# Patient Record
Sex: Female | Born: 1990 | Race: White | Hispanic: No | Marital: Married | Smoking: Never smoker
Health system: Southern US, Community
[De-identification: ages and names within clinical notes are randomized; demographics above are authoritative.]

## PROBLEM LIST (undated history)

## (undated) DIAGNOSIS — E663 Overweight: Secondary | ICD-10-CM

## (undated) DIAGNOSIS — M7660 Achilles tendinitis, unspecified leg: Secondary | ICD-10-CM

## (undated) DIAGNOSIS — K589 Irritable bowel syndrome without diarrhea: Secondary | ICD-10-CM

## (undated) DIAGNOSIS — R002 Palpitations: Secondary | ICD-10-CM

## (undated) DIAGNOSIS — K219 Gastro-esophageal reflux disease without esophagitis: Secondary | ICD-10-CM

## (undated) DIAGNOSIS — F32A Depression, unspecified: Secondary | ICD-10-CM

## (undated) DIAGNOSIS — F419 Anxiety disorder, unspecified: Secondary | ICD-10-CM

## (undated) DIAGNOSIS — M549 Dorsalgia, unspecified: Secondary | ICD-10-CM

## (undated) DIAGNOSIS — L709 Acne, unspecified: Secondary | ICD-10-CM

## (undated) HISTORY — DX: Acne, unspecified: L70.9

## (undated) HISTORY — DX: Overweight: E66.3

## (undated) HISTORY — DX: Dorsalgia, unspecified: M54.9

## (undated) HISTORY — DX: Palpitations: R00.2

## (undated) HISTORY — DX: Irritable bowel syndrome, unspecified: K58.9

## (undated) HISTORY — PX: NO PAST SURGERIES: SHX2092

## (undated) HISTORY — DX: Gastro-esophageal reflux disease without esophagitis: K21.9

## (undated) HISTORY — DX: Anxiety disorder, unspecified: F41.9

## (undated) HISTORY — DX: Achilles tendinitis, unspecified leg: M76.60

## (undated) HISTORY — DX: Depression, unspecified: F32.A

---

## 2016-08-19 DIAGNOSIS — R300 Dysuria: Secondary | ICD-10-CM | POA: Diagnosis not present

## 2016-08-19 DIAGNOSIS — Z30011 Encounter for initial prescription of contraceptive pills: Secondary | ICD-10-CM | POA: Diagnosis not present

## 2016-08-19 DIAGNOSIS — B373 Candidiasis of vulva and vagina: Secondary | ICD-10-CM | POA: Diagnosis not present

## 2016-08-19 DIAGNOSIS — Z30432 Encounter for removal of intrauterine contraceptive device: Secondary | ICD-10-CM | POA: Diagnosis not present

## 2016-09-26 DIAGNOSIS — Z01419 Encounter for gynecological examination (general) (routine) without abnormal findings: Secondary | ICD-10-CM | POA: Diagnosis not present

## 2016-09-26 DIAGNOSIS — Z124 Encounter for screening for malignant neoplasm of cervix: Secondary | ICD-10-CM | POA: Diagnosis not present

## 2016-09-26 DIAGNOSIS — Z3041 Encounter for surveillance of contraceptive pills: Secondary | ICD-10-CM | POA: Diagnosis not present

## 2016-09-26 LAB — LIPID PANEL
Cholesterol: 181 (ref 0–200)
HDL: 45 (ref 35–70)
LDL Cholesterol: 24
LDl/HDL Ratio: 2.5
Triglycerides: 119 (ref 40–160)

## 2017-01-10 ENCOUNTER — Other Ambulatory Visit: Payer: 59

## 2017-01-10 ENCOUNTER — Ambulatory Visit (INDEPENDENT_AMBULATORY_CARE_PROVIDER_SITE_OTHER): Payer: Managed Care, Other (non HMO) | Admitting: Family

## 2017-01-10 ENCOUNTER — Encounter: Payer: Self-pay | Admitting: Family

## 2017-01-10 VITALS — BP 118/76 | HR 74 | Temp 98.1°F | Wt 192.0 lb

## 2017-01-10 DIAGNOSIS — R350 Frequency of micturition: Secondary | ICD-10-CM | POA: Diagnosis not present

## 2017-01-10 DIAGNOSIS — R319 Hematuria, unspecified: Secondary | ICD-10-CM | POA: Diagnosis not present

## 2017-01-10 LAB — POC URINALSYSI DIPSTICK (AUTOMATED)
Bilirubin, UA: NEGATIVE
Glucose, UA: NEGATIVE
Ketones, UA: NEGATIVE
Leukocytes, UA: NEGATIVE
Nitrite, UA: NEGATIVE
Protein, UA: NEGATIVE
Spec Grav, UA: >=1.03 — AB (ref 1.010–1.025)
Urobilinogen, UA: 0.2 E.U./dL
pH, UA: 5.5 (ref 5.0–8.0)

## 2017-01-10 MED ORDER — SULFAMETHOXAZOLE-TRIMETHOPRIM 800-160 MG PO TABS
1.0000 | ORAL_TABLET | Freq: Two times a day (BID) | ORAL | 0 refills | Status: DC
Start: 2017-01-10 — End: 2017-02-05

## 2017-01-10 NOTE — Addendum Note (Signed)
Addended by: Tawnya CrookSAMBATH, Ivorie Uplinger on: 01/10/2017 04:16 PM   Modules accepted: Orders

## 2017-01-10 NOTE — Addendum Note (Signed)
Addended by: Tawnya CrookSAMBATH, Tobyn Osgood on: 01/10/2017 04:32 PM   Modules accepted: Orders

## 2017-01-10 NOTE — Progress Notes (Signed)
  Diana King is a 26 y.o. female with the following history as recorded in [employer]:  There are no active problems to display for this patient.   Current Outpatient Medications  Medication Sig Dispense Refill  . norethindrone-ethinyl estradiol (JUNEL FE,GILDESS FE,LOESTRIN FE) 1-20 MG-MCG tablet Take by mouth.    . sulfamethoxazole-trimethoprim (BACTRIM DS,SEPTRA DS) 800-160 MG tablet Take 1 tablet by mouth 2 (two) times daily. 10 tablet 0   No current facility-administered medications for this visit.     Allergies: Latex  No past medical history on file.  No family history on file.   Social History   Tobacco Use  . Smoking status: Never Smoker  . Smokeless tobacco: Never Used  Substance Use Topics  . 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   Objective:  Vitals:   01/10/17 0822  BP: 118/76  Pulse: 74  Temp: 98.1 F (36.7 C)  TempSrc: Oral  SpO2: 97%    General: Well developed, well nourished, in no acute distress  Skin : Warm and dry.  Head: Normocephalic and atraumatic  Lungs: Respirations unlabored; clear to auscultation bilaterally without wheeze, rales, rhonchi  CVS exam: normal rate and regular rhythm.  Neurologic: Alert and oriented; speech intact; face symmetrical; moves all extremities well; CNII-XII intact  without focal deficit   Assessment:  1. Hematuria, unspecified type   2. Urinary frequency     Plan:  1. ? Infection or possible kidney stone; check U/A and urine culture; will start patient on Bactrim DS bid x 5 days- back-up method discussed for OCP; plan to re-check U/A on Monday- if blood still present, would recommend CT for kidney stone; strict ER precautions for upcoming weekend.   No Follow-up on file.  Orders Placed This Encounter  Procedures  . CULTURE, URINE COMPREHENSIVE    Standing Status:   Future    Standing Expiration Date:   01/10/2018  . CT RENAL STONE STUDY    Standing Status:   Future    Standing Expiration Date:   04/11/2018    Scheduling Instructions:     Please try to schedule on Monday    Order Specific Question:   Is patient pregnant?    Answer:   No    Order Specific Question:   Preferred imaging location?    Answer:   [facility]    Order Specific Question:   Radiology Contrast Protocol - do NOT remove file path    Answer:   [email or web address]  . POCT Urinalysis Dipstick (Automated)    Requested Prescriptions   Signed Prescriptions Disp Refills  . sulfamethoxazole-trimethoprim (BACTRIM DS,SEPTRA DS) 800-160 MG tablet 10 tablet 0    Sig: Take 1 tablet by mouth 2 (two) times daily.

## 2017-01-12 LAB — CULTURE, URINE COMPREHENSIVE
MICRO NUMBER:: 81439485
SPECIMEN QUALITY:: ADEQUATE

## 2017-01-13 NOTE — Progress Notes (Signed)
Reviewed culture with patient; appears c/w contamination; repeated U/A and amount of blood has decreased compared to Friday; suspect that may have passed small stone; since patient not in pain and hematuria improving, will check U/A again in 2 days to ensure resolved;

## 2017-01-15 ENCOUNTER — Inpatient Hospital Stay: Admission: RE | Admit: 2017-01-15 | Payer: 59 | Source: Ambulatory Visit

## 2017-02-05 ENCOUNTER — Other Ambulatory Visit (INDEPENDENT_AMBULATORY_CARE_PROVIDER_SITE_OTHER): Payer: Managed Care, Other (non HMO)

## 2017-02-05 ENCOUNTER — Telehealth: Payer: Self-pay | Admitting: Family

## 2017-02-05 ENCOUNTER — Other Ambulatory Visit: Payer: Self-pay | Admitting: Family

## 2017-02-05 DIAGNOSIS — R3121 Asymptomatic microscopic hematuria: Secondary | ICD-10-CM

## 2017-02-05 LAB — URINALYSIS, ROUTINE W REFLEX MICROSCOPIC
Bilirubin Urine: NEGATIVE
Ketones, ur: 15 — AB
Leukocytes, UA: NEGATIVE
Nitrite: NEGATIVE
Specific Gravity, Urine: 1.015 (ref 1.000–1.030)
Total Protein, Urine: NEGATIVE
Urine Glucose: NEGATIVE
Urobilinogen, UA: 0.2 (ref 0.0–1.0)
pH: 8 (ref 5.0–8.0)

## 2017-02-05 NOTE — Telephone Encounter (Signed)
Patient seen in office as professional courtesy on recent episode of unexplained microscopic hematuria; she is not currently on her period- ended 4 days ago; will repeat U/A with microscopic exam; if symptoms persist, will refer to urology; patient in agreement with plan.

## 2017-02-07 ENCOUNTER — Other Ambulatory Visit: Payer: Self-pay | Admitting: Family

## 2017-02-07 DIAGNOSIS — R3121 Asymptomatic microscopic hematuria: Secondary | ICD-10-CM

## 2017-02-07 NOTE — Progress Notes (Signed)
Reviewed with patient; blood is present; will refer to urology; she is in agreement.

## 2017-03-10 ENCOUNTER — Encounter: Payer: Self-pay | Admitting: Internal Medicine

## 2017-03-10 ENCOUNTER — Ambulatory Visit: Payer: Managed Care, Other (non HMO) | Admitting: Internal Medicine

## 2017-03-10 VITALS — BP 120/84 | HR 74 | Temp 98.1°F | Resp 16

## 2017-03-10 DIAGNOSIS — J069 Acute upper respiratory infection, unspecified: Secondary | ICD-10-CM

## 2017-03-10 NOTE — Progress Notes (Signed)
    Subjective:    Patient ID: Diana King, female    DOB: 1990-12-25, 27 y.o.   MRN: [id number]  HPI She is here for an acute visit for cold symptoms.  Her symptoms started two days ago.  She is experiencing subjective fever, nasal congestion, rhinorrhea, sore throat, shortness of breath, myalgias and lightheadedness at times.  She denies any known fever, chills, sinus pain, cough, wheeze, GI symptoms and headaches.  She has taken dayquil, nyquil, Cepacol,Nasal spray, advil  Medications and allergies reviewed with patient and updated if appropriate.  There are no active problems to display for this patient.   Current Outpatient Medications on File Prior to Visit  Medication Sig Dispense Refill  . drospirenone-ethinyl estradiol (YAZ,GIANVI,LORYNA) 3-0.02 MG tablet Take 1 tablet by mouth daily.     No current facility-administered medications on file prior to visit.     History reviewed. No pertinent past medical history.  History reviewed. No pertinent surgical history.  Social History   Socioeconomic History  . Marital status: Married    Spouse name: None  . Number of children: None  . Years of education: None  . Highest education level: None  Social Needs  . Financial resource strain: None  . Food insecurity - worry: None  . Food insecurity - inability: None  . Transportation needs - medical: None  . Transportation needs - non-medical: None  Occupational History  . None  Tobacco Use  . Smoking status: Never Smoker  . Smokeless tobacco: Never Used  Substance and Sexual Activity  . Alcohol use: Yes    Frequency: Never    Comment: social  . Drug use: None  . Sexual activity: None  Other Topics Concern  . None  Social History Narrative  . None    Family History  Problem Relation Age of Onset  . Fibromyalgia Mother   . Interstitial cystitis Mother   . Migraines Mother   . Alcohol abuse Maternal Aunt   . Mental illness Maternal Aunt   . Alcohol abuse  Maternal Uncle   . Thyroid disease Maternal Grandmother     Review of Systems  Constitutional: Positive for fever (subjective ). Negative for chills.  HENT: Positive for congestion, rhinorrhea and sore throat. Negative for ear pain (clogged sensation), sinus pressure and sinus pain.   Respiratory: Positive for shortness of breath. Negative for cough and wheezing.   Gastrointestinal: Negative for diarrhea and nausea.  Musculoskeletal: Positive for myalgias.  Neurological: Positive for light-headedness. Negative for dizziness and headaches.       Objective:   Vitals:   03/10/17 1148  BP: 120/84  Pulse: 74  Resp: 16  Temp: 98.1 F (36.7 C)  SpO2: 98%   There were no vitals filed for this visit. There is no height or weight on file to calculate BMI.  Wt Readings from Last 3 Encounters:  01/10/17 192 lb (87.1 kg)     Physical Exam GENERAL APPEARANCE: Appears stated age, well appearing, NAD EYES: conjunctiva clear, no icterus HEENT: bilateral tympanic membranes and ear canals normal, oropharynx with mild erythema, left small tonsillar stone, no thyromegaly, trachea midline, no cervical or supraclavicular lymphadenopathy LUNGS: Clear to auscultation without wheeze or crackles, unlabored breathing, good air entry bilaterally CARDIOVASCULAR: Normal S1,S2 without murmurs, no edema SKIN: warm, dry        Assessment & Plan:   See Problem List for Assessment and Plan of chronic medical problems.

## 2017-03-10 NOTE — Assessment & Plan Note (Signed)
Likely viral in nature Symptomatic treatment Continue current over-the-counter cold medications She will me know if there is no improvement or if symptoms worsen we can consider an antibiotic Rest, fluids

## 2017-03-10 NOTE — Patient Instructions (Signed)
  If your symptoms worsen or fail to improve, please contact our office for further instruction, or in case of emergency go directly to the emergency room at the closest medical facility.   General Recommendations:    Please drink plenty of fluids.  Get plenty of rest   Sleep in humidified air  Use saline nasal sprays  Netti pot  OTC Medications:  Decongestants - helps relieve congestion   Flonase (generic fluticasone) or Nasacort (generic triamcinolone) - please make sure to use the "cross-over" technique at a 45 degree angle towards the opposite eye as opposed to straight up the nasal passageway.   Sudafed (generic pseudoephedrine - Note this is the one that is available behind the pharmacy counter); Products with phenylephrine (-PE) may also be used but is often not as effective as pseudoephedrine.   If you have HIGH BLOOD PRESSURE - Coricidin HBP; AVOID any product that is -D as this contains pseudoephedrine which may increase your blood pressure.  Afrin (oxymetazoline) every 6-8 hours for up to 3 days.  Allergies - helps relieve runny nose, itchy eyes and sneezing   Claritin (generic loratidine), Allegra (fexofenidine), or Zyrtec (generic cyrterizine) for runny nose. These medications should not cause drowsiness.  Note - Benadryl (generic diphenhydramine) may be used however may cause drowsiness  Cough -   Delsym or Robitussin (generic dextromethorphan)  Expectorants - helps loosen mucus to ease removal   Mucinex (generic guaifenesin) as directed on the package.  Headaches / General Aches   Tylenol (generic acetaminophen) - DO NOT EXCEED 3 grams (3,000 mg) in a 24 hour time period  Advil/Motrin (generic ibuprofen)  Sore Throat -   Salt water gargle   Chloraseptic (generic benzocaine) spray or lozenges / Sucrets (generic dyclonine)  

## 2017-06-10 NOTE — Progress Notes (Signed)
Subjective:    Patient ID: Diana King, female    DOB: 1990/03/10, 27 y.o.   MRN: [id number]  HPI  She is here for a physical exam.    She denies changes in her family or personal history.    She is following with her gyn and was recently put on different birth control.  This has helped to control her menses and she feels more emotionally stable.   She still feels very irritable and feels it is hormonal.  She talked to her gyn and they discussed possible starting paxil.  She wonders if she needs it.  She denies depression or anxiety - just feels irritable and feels she is taking it out on her husband and daughter.   She is concerned about her weight.  She is not currently exercising.  She is trying to eat better and eat less.  She knows she needs to work on weight loss.     Medications and allergies reviewed with patient and updated if appropriate.  Patient Active Problem List   Diagnosis Date Noted  . Irritability 06/11/2017  . Low back pain 06/11/2017    Current Outpatient Medications on File Prior to Visit  Medication Sig Dispense Refill  . drospirenone-ethinyl estradiol (YAZ,GIANVI,LORYNA) 3-0.02 MG tablet Take 1 tablet by mouth daily.     No current facility-administered medications on file prior to visit.     History reviewed. No pertinent past medical history.  Past Surgical History:  Procedure Laterality Date  . NO PAST SURGERIES      Social History   Socioeconomic History  . Marital status: Married    Spouse name: Not on file  . Number of children: Not on file  . Years of education: Not on file  . Highest education level: Not on file  Occupational History  . Not on file  Social Needs  . Financial resource strain: Not on file  . Food insecurity:    Worry: Not on file    Inability: Not on file  . Transportation needs:    Medical: Not on file    Non-medical: Not on file  Tobacco Use  . Smoking status: Never Smoker  . Smokeless tobacco: Never Used    Substance and Sexual Activity  . Alcohol use: Yes    Frequency: Never    Comment: social  . Drug use: Not on file  . Sexual activity: Not on file  Lifestyle  . Physical activity:    Days per week: Not on file    Minutes per session: Not on file  . Stress: Not on file  Relationships  . Social connections:    Talks on phone: Not on file    Gets together: Not on file    Attends religious service: Not on file    Active member of club or organization: Not on file    Attends meetings of clubs or organizations: Not on file    Relationship status: Not on file  Other Topics Concern  . Not on file  Social History Narrative  . Not on file    Family History  Problem Relation Age of Onset  . Fibromyalgia Mother   . Interstitial cystitis Mother   . Migraines Mother   . Alcohol abuse Maternal Aunt   . Mental illness Maternal Aunt   . Alcohol abuse Maternal Uncle   . Thyroid disease Maternal Grandmother     Review of Systems  Constitutional: Positive for fatigue (low energy level). Negative  for chills and fever.  Eyes: Negative for visual disturbance.  Respiratory: Negative for cough, shortness of breath and wheezing.   Cardiovascular: Negative for chest pain, palpitations and leg swelling.  Gastrointestinal: Positive for abdominal distention (with certain foods), diarrhea (more often - depends on food) and nausea (with certain foods). Negative for blood in stool and constipation.  Genitourinary: Negative for dysuria and hematuria.  Musculoskeletal: Positive for back pain (intermittent - lower back).  Skin: Negative for color change and rash.  Neurological: Negative for dizziness, light-headedness and headaches.  Psychiatric/Behavioral: Positive for agitation (irritability). Negative for dysphoric mood. The patient is not nervous/anxious.        Irritable at times       Objective:   Vitals:   06/11/17 1545  BP: 104/60  Pulse: 80  Resp: 16  Temp: 98.1 F (36.7 C)  SpO2:  98%   BP Readings from Last 3 Encounters:  06/11/17 104/60  03/10/17 120/84  01/10/17 118/76   Wt Readings from Last 3 Encounters:  06/11/17 192 lb (87.1 kg)  01/10/17 192 lb (87.1 kg)   Body mass index is 31.95 kg/m.   Physical Exam    Constitutional: She appears well-developed and well-nourished. No distress.  HENT:  Head: Normocephalic and atraumatic.  Right Ear: External ear normal. Normal ear canal and TM Left Ear: External ear normal.  Normal ear canal and TM Mouth/Throat: Oropharynx is clear and moist.  Eyes: Conjunctivae and EOM are normal.  Neck: Neck supple. No tracheal deviation present. No thyromegaly present.  No carotid bruit  Cardiovascular: Normal rate, regular rhythm and normal heart sounds.   No murmur heard.  No edema. Pulmonary/Chest: Effort normal and breath sounds normal. No respiratory distress. She has no wheezes. She has no rales.  Breast: deferred to Gyn Abdominal: Soft. She exhibits no distension. There is no tenderness.  Lymphadenopathy: She has no cervical adenopathy.  Skin: Skin is warm and dry. She is not diaphoretic.  Psychiatric: She has a normal mood and affect. Her behavior is normal.       Assessment & Plan:   Physical exam: Screening blood work  ordered Immunizations   Up to date  Gyn   Up to date  Eye exams   Up to date  Exercise   Not currently - discussed importance regular exercise Weight  Working on weight loss - discussed healthy diet and decreased portions Skin   No concernes Substance abuse  none     See Problem List for Assessment and Plan of chronic medical problems.

## 2017-06-11 ENCOUNTER — Encounter: Payer: Self-pay | Admitting: Internal Medicine

## 2017-06-11 ENCOUNTER — Ambulatory Visit (INDEPENDENT_AMBULATORY_CARE_PROVIDER_SITE_OTHER): Payer: 59 | Admitting: Internal Medicine

## 2017-06-11 VITALS — BP 104/60 | HR 80 | Temp 98.1°F | Resp 16 | Ht 65.0 in | Wt 192.0 lb

## 2017-06-11 DIAGNOSIS — Z114 Encounter for screening for human immunodeficiency virus [HIV]: Secondary | ICD-10-CM

## 2017-06-11 DIAGNOSIS — Z6832 Body mass index (BMI) 32.0-32.9, adult: Secondary | ICD-10-CM | POA: Insufficient documentation

## 2017-06-11 DIAGNOSIS — M545 Low back pain, unspecified: Secondary | ICD-10-CM | POA: Insufficient documentation

## 2017-06-11 DIAGNOSIS — E669 Obesity, unspecified: Secondary | ICD-10-CM | POA: Insufficient documentation

## 2017-06-11 DIAGNOSIS — Z0000 Encounter for general adult medical examination without abnormal findings: Secondary | ICD-10-CM

## 2017-06-11 DIAGNOSIS — R454 Irritability and anger: Secondary | ICD-10-CM

## 2017-06-11 DIAGNOSIS — F32A Depression, unspecified: Secondary | ICD-10-CM | POA: Insufficient documentation

## 2017-06-11 DIAGNOSIS — F329 Major depressive disorder, single episode, unspecified: Secondary | ICD-10-CM | POA: Insufficient documentation

## 2017-06-11 DIAGNOSIS — F419 Anxiety disorder, unspecified: Secondary | ICD-10-CM | POA: Insufficient documentation

## 2017-06-11 MED ORDER — BUPROPION HCL ER (XL) 150 MG PO TB24: 150.0000 mg | ORAL_TABLET | Freq: Every day | ORAL | 3 refills | Status: DC

## 2017-06-11 NOTE — Assessment & Plan Note (Signed)
hormonal No anxiety or depression Will try wellbutrin 150 mg XL daily  -- this may or may not help, but may help with increasing energy and weight loss If not effective will consider adding lexapro or changing to low dose paxil

## 2017-06-11 NOTE — Patient Instructions (Addendum)
Test(s) ordered today. Your results will be released to [facility] (or called to you) after review, usually within 72hours after test completion. If any changes need to be made, you will be notified at that same time.  All other Health Maintenance issues reviewed.   All recommended immunizations and age-appropriate screenings are up-to-date or discussed.  No immunizations administered today.   Medications reviewed and updated.  Changes include starting wellbutrin 150 mg daily.   Your prescription(s) have been submitted to your pharmacy. Please take as directed and contact our office if you believe you are having problem(s) with the medication(s).   Please followup in one year   Health Maintenance, Female Adopting a healthy lifestyle and getting preventive care can go a long way to promote health and wellness. Talk with your health care provider about what schedule of regular examinations is right for you. This is a good chance for you to check in with your provider about disease prevention and staying healthy. In between checkups, there are plenty of things you can do on your own. Experts have done a lot of research about which lifestyle changes and preventive measures are most likely to keep you healthy. Ask your health care provider for more information. Weight and diet Eat a healthy diet  Be sure to include plenty of vegetables, fruits, low-fat dairy products, and lean protein.  Do not eat a lot of foods high in solid fats, added sugars, or salt.  Get regular exercise. This is one of the most important things you can do for your health. ? Most adults should exercise for at least 150 minutes each week. The exercise should increase your heart rate and make you sweat (moderate-intensity exercise). ? Most adults should also do strengthening exercises at least twice a week. This is in addition to the moderate-intensity exercise.  Maintain a healthy weight  Body mass index (BMI) is a  measurement that can be used to identify possible weight problems. It estimates body fat based on height and weight. Your health care provider can help determine your BMI and help you achieve or maintain a healthy weight.  For females 35 years of age and older: ? A BMI below 18.5 is considered underweight. ? A BMI of 18.5 to 24.9 is normal. ? A BMI of 25 to 29.9 is considered overweight. ? A BMI of 30 and above is considered obese.  Watch levels of cholesterol and blood lipids  You should start having your blood tested for lipids and cholesterol at 29 years of age, then have this test every 5 years.  You may need to have your cholesterol levels checked more often if: ? Your lipid or cholesterol levels are high. ? You are older than 29 years of age. ? You are at high risk for heart disease.  Cancer screening Lung Cancer  Lung cancer screening is recommended for adults 63-70 years old who are at high risk for lung cancer because of a history of smoking.  A yearly low-dose CT scan of the lungs is recommended for people who: ? Currently smoke. ? Have quit within the past 15 years. ? Have at least a 30-pack-year history of smoking. A pack year is smoking an average of one pack of cigarettes a day for 1 year.  Yearly screening should continue until it has been 15 years since you quit.  Yearly screening should stop if you develop a health problem that would prevent you from having lung cancer treatment.  Breast Cancer  Practice  breast self-awareness. This means understanding how your breasts normally appear and feel.  It also means doing regular breast self-exams. Let your health care provider know about any changes, no matter how small.  If you are in your 20s or 30s, you should have a clinical breast exam (CBE) by a health care provider every 1-3 years as part of a regular health exam.  If you are 49 or older, have a CBE every year. Also consider having a breast X-ray (mammogram)  every year.  If you have a family history of breast cancer, talk to your health care provider about genetic screening.  If you are at high risk for breast cancer, talk to your health care provider about having an MRI and a mammogram every year.  Breast cancer gene (BRCA) assessment is recommended for women who have family members with BRCA-related cancers. BRCA-related cancers include: ? Breast. ? Ovarian. ? Tubal. ? Peritoneal cancers.  Results of the assessment will determine the need for genetic counseling and BRCA1 and BRCA2 testing.  Cervical Cancer Your health care provider may recommend that you be screened regularly for cancer of the pelvic organs (ovaries, uterus, and vagina). This screening involves a pelvic examination, including checking for microscopic changes to the surface of your cervix (Pap test). You may be encouraged to have this screening done every 3 years, beginning at age 67.  For women ages 52-65, health care providers may recommend pelvic exams and Pap testing every 3 years, or they may recommend the Pap and pelvic exam, combined with testing for human papilloma virus (HPV), every 5 years. Some types of HPV increase your risk of cervical cancer. Testing for HPV may also be done on women of any age with unclear Pap test results.  Other health care providers may not recommend any screening for nonpregnant women who are considered low risk for pelvic cancer and who do not have symptoms. Ask your health care provider if a screening pelvic exam is right for you.  If you have had past treatment for cervical cancer or a condition that could lead to cancer, you need Pap tests and screening for cancer for at least 20 years after your treatment. If Pap tests have been discontinued, your risk factors (such as having a new sexual partner) need to be reassessed to determine if screening should resume. Some women have medical problems that increase the chance of getting cervical  cancer. In these cases, your health care provider may recommend more frequent screening and Pap tests.  Colorectal Cancer  This type of cancer can be detected and often prevented.  Routine colorectal cancer screening usually begins at 29 years of age and continues through 29 years of age.  Your health care provider may recommend screening at an earlier age if you have risk factors for colon cancer.  Your health care provider may also recommend using home test kits to check for hidden blood in the stool.  A small camera at the end of a tube can be used to examine your colon directly (sigmoidoscopy or colonoscopy). This is done to check for the earliest forms of colorectal cancer.  Routine screening usually begins at age 40.  Direct examination of the colon should be repeated every 5-10 years through 29 years of age. However, you may need to be screened more often if early forms of precancerous polyps or small growths are found.  Skin Cancer  Check your skin from head to toe regularly.  Tell your health care provider  about any new moles or changes in moles, especially if there is a change in a mole's shape or color.  Also tell your health care provider if you have a mole that is larger than the size of a pencil eraser.  Always use sunscreen. Apply sunscreen liberally and repeatedly throughout the day.  Protect yourself by wearing long sleeves, pants, a wide-brimmed hat, and sunglasses whenever you are outside.  Heart disease, diabetes, and high blood pressure  High blood pressure causes heart disease and increases the risk of stroke. High blood pressure is more likely to develop in: ? People who have blood pressure in the high end of the normal range (130-139/85-89 mm Hg). ? People who are overweight or obese. ? People who are African American.  If you are 83-3 years of age, have your blood pressure checked every 3-5 years. If you are 79 years of age or older, have your blood  pressure checked every year. You should have your blood pressure measured twice-once when you are at a hospital or clinic, and once when you are not at a hospital or clinic. Record the average of the two measurements. To check your blood pressure when you are not at a hospital or clinic, you can use: ? An automated blood pressure machine at a pharmacy. ? A home blood pressure monitor.  If you are between 95 years and 47 years old, ask your health care provider if you should take aspirin to prevent strokes.  Have regular diabetes screenings. This involves taking a blood sample to check your fasting blood sugar level. ? If you are at a normal weight and have a low risk for diabetes, have this test once every three years after 29 years of age. ? If you are overweight and have a high risk for diabetes, consider being tested at a younger age or more often. Preventing infection Hepatitis B  If you have a higher risk for hepatitis B, you should be screened for this virus. You are considered at high risk for hepatitis B if: ? You were born in a country where hepatitis B is common. Ask your health care provider which countries are considered high risk. ? Your parents were born in a high-risk country, and you have not been immunized against hepatitis B (hepatitis B vaccine). ? You have HIV or AIDS. ? You use needles to inject street drugs. ? You live with someone who has hepatitis B. ? You have had sex with someone who has hepatitis B. ? You get hemodialysis treatment. ? You take certain medicines for conditions, including cancer, organ transplantation, and autoimmune conditions.  Hepatitis C  Blood testing is recommended for: ? Everyone born from [birth year] through 1967. ? Anyone with known risk factors for hepatitis C.  Sexually transmitted infections (STIs)  You should be screened for sexually transmitted infections (STIs) including gonorrhea and chlamydia if: ? You are sexually active and are  younger than 29 years of age. ? You are older than 29 years of age and your health care provider tells you that you are at risk for this type of infection. ? Your sexual activity has changed since you were last screened and you are at an increased risk for chlamydia or gonorrhea. Ask your health care provider if you are at risk.  If you do not have HIV, but are at risk, it may be recommended that you take a prescription medicine daily to prevent HIV infection. This is called pre-exposure prophylaxis (PrEP). You are  considered at risk if: ? You are sexually active and do not regularly use condoms or know the HIV status of your partner(s). ? You take drugs by injection. ? You are sexually active with a partner who has HIV.  Talk with your health care provider about whether you are at high risk of being infected with HIV. If you choose to begin PrEP, you should first be tested for HIV. You should then be tested every 3 months for as long as you are taking PrEP. Pregnancy  If you are premenopausal and you may become pregnant, ask your health care provider about preconception counseling.  If you may become pregnant, take 400 to 800 micrograms (mcg) of folic acid every day.  If you want to prevent pregnancy, talk to your health care provider about birth control (contraception). Osteoporosis and menopause  Osteoporosis is a disease in which the bones lose minerals and strength with aging. This can result in serious bone fractures. Your risk for osteoporosis can be identified using a bone density scan.  If you are 39 years of age or older, or if you are at risk for osteoporosis and fractures, ask your health care provider if you should be screened.  Ask your health care provider whether you should take a calcium or vitamin D supplement to lower your risk for osteoporosis.  Menopause may have certain physical symptoms and risks.  Hormone replacement therapy may reduce some of these symptoms and  risks. Talk to your health care provider about whether hormone replacement therapy is right for you. Follow these instructions at home:  Schedule regular health, dental, and eye exams.  Stay current with your immunizations.  Do not use any tobacco products including cigarettes, chewing tobacco, or electronic cigarettes.  If you are pregnant, do not drink alcohol.  If you are breastfeeding, limit how much and how often you drink alcohol.  Limit alcohol intake to no more than 1 drink per day for nonpregnant women. One drink equals 12 ounces of beer, 5 ounces of wine, or 1 ounces of hard liquor.  Do not use street drugs.  Do not share needles.  Ask your health care provider for help if you need support or information about quitting drugs.  Tell your health care provider if you often feel depressed.  Tell your health care provider if you have ever been abused or do not feel safe at home. This information is not intended to replace advice given to you by your health care provider. Make sure you discuss any questions you have with your health care provider. Document Released: 07/23/2010 Document Revised: 06/15/2015 Document Reviewed: 10/11/2014 Elsevier Interactive Patient Education  [employer].

## 2017-06-11 NOTE — Assessment & Plan Note (Signed)
Intermittent, low back Will try to work on core strength

## 2017-06-11 NOTE — Assessment & Plan Note (Signed)
Wants to lose weight Will try to start regular exercise Discussed healthy diet and decreased portions Stressed working on lifestyle changes.

## 2017-06-12 ENCOUNTER — Encounter: Payer: Self-pay | Admitting: Internal Medicine

## 2017-06-13 ENCOUNTER — Other Ambulatory Visit (INDEPENDENT_AMBULATORY_CARE_PROVIDER_SITE_OTHER): Payer: 59

## 2017-06-13 DIAGNOSIS — Z0000 Encounter for general adult medical examination without abnormal findings: Secondary | ICD-10-CM | POA: Diagnosis not present

## 2017-06-13 DIAGNOSIS — Z114 Encounter for screening for human immunodeficiency virus [HIV]: Secondary | ICD-10-CM

## 2017-06-13 LAB — COMPREHENSIVE METABOLIC PANEL
ALT: 11 U/L (ref 0–35)
AST: 13 U/L (ref 0–37)
Albumin: 4.2 g/dL (ref 3.5–5.2)
Alkaline Phosphatase: 31 U/L — ABNORMAL LOW (ref 39–117)
BUN: 14 mg/dL (ref 6–23)
CO2: 25 mEq/L (ref 19–32)
Calcium: 9.3 mg/dL (ref 8.4–10.5)
Chloride: 104 mEq/L (ref 96–112)
Creatinine, Ser: 0.85 mg/dL (ref 0.40–1.20)
GFR: 83.89 mL/min (ref >60.00)
Glucose, Bld: 102 mg/dL — ABNORMAL HIGH (ref 70–99)
Potassium: 4.2 mEq/L (ref 3.5–5.1)
Sodium: 139 mEq/L (ref 135–145)
Total Bilirubin: 0.3 mg/dL (ref 0.2–1.2)
Total Protein: 7.5 g/dL (ref 6.0–8.3)

## 2017-06-13 LAB — CBC WITH DIFFERENTIAL/PLATELET
Basophils Absolute: 0.1 10*3/uL (ref 0.0–0.1)
Basophils Relative: 0.8 % (ref 0.0–3.0)
Eosinophils Absolute: 0.1 10*3/uL (ref 0.0–0.7)
Eosinophils Relative: 0.8 % (ref 0.0–5.0)
HCT: 38.9 % (ref 36.0–46.0)
Hemoglobin: 12.9 g/dL (ref 12.0–15.0)
Lymphocytes Relative: 33.7 % (ref 12.0–46.0)
Lymphs Abs: 2.6 10*3/uL (ref 0.7–4.0)
MCHC: 33 g/dL (ref 30.0–36.0)
MCV: 85.1 fl (ref 78.0–100.0)
Monocytes Absolute: 0.7 10*3/uL (ref 0.1–1.0)
Monocytes Relative: 8.7 % (ref 3.0–12.0)
Neutro Abs: 4.2 10*3/uL (ref 1.4–7.7)
Neutrophils Relative %: 56 % (ref 43.0–77.0)
Platelets: 362 10*3/uL (ref 150.0–400.0)
RBC: 4.58 Mil/uL (ref 3.87–5.11)
RDW: 12.8 % (ref 11.5–15.5)
WBC: 7.6 10*3/uL (ref 4.0–10.5)

## 2017-06-13 LAB — LIPID PANEL
Cholesterol: 175 mg/dL (ref 0–200)
HDL: 49.2 mg/dL (ref >39.00)
LDL Cholesterol: 93 mg/dL (ref 0–99)
NonHDL: 125.62
Total CHOL/HDL Ratio: 4
Triglycerides: 164 mg/dL — ABNORMAL HIGH (ref 0.0–149.0)
VLDL: 32.8 mg/dL (ref 0.0–40.0)

## 2017-06-13 LAB — TSH: TSH: 3.17 u[IU]/mL (ref 0.35–4.50)

## 2017-06-14 LAB — HIV ANTIBODY (ROUTINE TESTING W REFLEX): HIV 1&2 Ab, 4th Generation: NONREACTIVE

## 2017-06-15 ENCOUNTER — Encounter: Payer: Self-pay | Admitting: Internal Medicine

## 2017-06-18 ENCOUNTER — Ambulatory Visit (INDEPENDENT_AMBULATORY_CARE_PROVIDER_SITE_OTHER): Payer: 59 | Admitting: Family Medicine

## 2017-06-18 DIAGNOSIS — M999 Biomechanical lesion, unspecified: Secondary | ICD-10-CM | POA: Diagnosis not present

## 2017-06-18 DIAGNOSIS — M545 Low back pain, unspecified: Secondary | ICD-10-CM

## 2017-06-18 MED ORDER — TIZANIDINE HCL 4 MG PO TABS: 4.0000 mg | ORAL_TABLET | Freq: Every evening | ORAL | 2 refills | Status: AC

## 2017-06-18 NOTE — Patient Instructions (Addendum)
Good to see you  Ice 20 minutes 2 times daily. Usually after activity and before bed. Exercises 3 times a week.  Duexis 3 times a day for 3 days Zanaflex nightly for next week then as needed Check in with me in the hall on Monday

## 2017-06-18 NOTE — Assessment & Plan Note (Signed)
Decision today to treat with OMT was based on Physical Exam  After verbal consent patient was treated with HVLA, ME, FPR techniques in  lumbar and sacral areas  Patient tolerated the procedure well with improvement in symptoms  Patient given exercises, stretches and lifestyle modifications  See medications in patient instructions if given  Patient will follow up in 4 weeks

## 2017-06-18 NOTE — Assessment & Plan Note (Signed)
Significant tightness noted today.  No radicular symptoms.  Tightness bilaterally with Pearlean Brownie.  Letter do not feel that this is more of a herniated disc and likely seen more like muscle spasms.  Discussed home exercise, muscle relaxer given, responded well to manipulation.  Follow-up again in 4 weeks

## 2017-06-18 NOTE — Progress Notes (Signed)
[name] [address] [place], [place] [address] Phone: [phone] Subjective:     CC: Back pain  [id number]  Diana King is a 27 y.o. female coming in with complaint of back pain.  Patient states that this started 2 weeks ago.  Did do a lot of running recently.  Patient denies any radiation of the leg.  Significant tightness.  Rates the severity pain is 7 out of 10.     Past Medical History:  Diagnosis Date  . GERD (gastroesophageal reflux disease)    Past Surgical History:  Procedure Laterality Date  . NO PAST SURGERIES     Social History   Socioeconomic History  . Marital status: Married    Spouse name: Amalia Hailey  . Number of children: 1  . Years of education: Not on file  . Highest education level: Not on file  Occupational History  . Not on file  Social Needs  . Financial resource strain: Not on file  . Food insecurity:    Worry: Not on file    Inability: Not on file  . Transportation needs:    Medical: Not on file    Non-medical: Not on file  Tobacco Use  . Smoking status: Never Smoker  . Smokeless tobacco: Never Used  Substance and Sexual Activity  . Alcohol use: Yes    Frequency: Never    Comment: social  . Drug use: Not on file  . Sexual activity: Not on file  Lifestyle  . Physical activity:    Days per week: Not on file    Minutes per session: Not on file  . Stress: Not on file  Relationships  . Social connections:    Talks on phone: Not on file    Gets together: Not on file    Attends religious service: Not on file    Active member of club or organization: Not on file    Attends meetings of clubs or organizations: Not on file    Relationship status: Not on file  Other Topics Concern  . Not on file  Social History Narrative  . Not on file   Allergies  Allergen Reactions  . Latex Swelling   Family History  Problem Relation Age of Onset  . Fibromyalgia Mother   . Interstitial cystitis Mother   .  Migraines Mother   . Alcohol abuse Maternal Aunt   . Mental illness Maternal Aunt   . Alcohol abuse Maternal Uncle   . Thyroid disease Maternal Grandmother      Past medical history, social, surgical and family history all reviewed in electronic medical record.  No pertanent information unless stated regarding to the chief complaint.   Review of Systems:Review of systems updated and as accurate as of 06/18/17  No headache, visual changes, nausea, vomiting, diarrhea, constipation, dizziness, abdominal pain, skin rash, fevers, chills, night sweats, weight loss, swollen lymph nodes, body aches, joint swelling,  chest pain, shortness of breath, mood changes.  Positive muscle aches  Objective  Height  (1.651 m). Systems examined below as of 06/18/17   General: No apparent distress alert and oriented x3 mood and affect normal, dressed appropriately.  HEENT: Pupils equal, extraocular movements intact  Respiratory: Patient's speak in full sentences and does not appear short of breath  Cardiovascular: No lower extremity edema, non tender, no erythema  Skin: Warm dry intact with no signs of infection or rash on extremities or on axial skeleton.  Abdomen: Soft nontender  Neuro: Cranial nerves II through XII are intact, neurovascularly intact in all extremities with 2+ DTRs and 2+ pulses.  Lymph: No lymphadenopathy of posterior or anterior cervical chain or axillae bilaterally.  Gait normal with good balance and coordination.  MSK:  Non tender with full range of motion and good stability and symmetric strength and tone of shoulders, elbows, wrist, hip, knee and ankles bilaterally.  Back Exam:  Inspection: Loss of lordosis Motion: Flexion 35 deg, Extension 25 deg, Side Bending to 35 deg bilaterally,  Rotation to 35 deg bilaterally  SLR laying: Negative  XSLR laying: Negative  Palpable tenderness: Tenderness to palpation in the paraspinal musculature lumbar spine throughout bilateral. FABER:  Tightness bilateral. Sensory change: Gross sensation intact to all lumbar and sacral dermatomes.  Reflexes: 2+ at both patellar tendons, 2+ at achilles tendons, Babinski's downgoing.  Strength at foot  Plantar-flexion: 5/5 Dorsi-flexion: 5/5 Eversion: 5/5 Inversion: 5/5  Leg strength  Quad: 5/5 Hamstring: 5/5 Hip flexor: 5/5 Hip abductors: 4/5  Gait unremarkable.  Osteopathic findings  L3 flexed rotated and side bent right Sacrum right on right    Impression and Recommendations:     This case required medical decision making of moderate complexity.      Note: This dictation was prepared with Dragon dictation along with smaller phrase technology. Any transcriptional errors that result from this process are unintentional.

## 2017-07-17 ENCOUNTER — Other Ambulatory Visit: Payer: Self-pay | Admitting: Internal Medicine

## 2017-07-17 MED ORDER — ESCITALOPRAM OXALATE 10 MG PO TABS: 10.0000 mg | ORAL_TABLET | Freq: Every day | ORAL | 5 refills | Status: DC

## 2017-08-08 ENCOUNTER — Other Ambulatory Visit: Payer: Self-pay | Admitting: Internal Medicine

## 2017-08-25 ENCOUNTER — Other Ambulatory Visit: Payer: Self-pay | Admitting: Internal Medicine

## 2017-08-25 MED ORDER — PAROXETINE HCL 10 MG PO TABS: 10.0000 mg | ORAL_TABLET | Freq: Every day | ORAL | 5 refills | Status: DC

## 2017-09-17 ENCOUNTER — Other Ambulatory Visit: Payer: Self-pay | Admitting: Internal Medicine

## 2017-09-29 ENCOUNTER — Other Ambulatory Visit: Payer: Self-pay | Admitting: Internal Medicine

## 2017-09-29 MED ORDER — VENLAFAXINE HCL ER 37.5 MG PO CP24: 37.5000 mg | ORAL_CAPSULE | Freq: Every day | ORAL | 5 refills | Status: DC

## 2017-09-30 ENCOUNTER — Ambulatory Visit (INDEPENDENT_AMBULATORY_CARE_PROVIDER_SITE_OTHER): Payer: 59 | Admitting: Family Medicine

## 2017-09-30 ENCOUNTER — Other Ambulatory Visit: Payer: Self-pay | Admitting: Internal Medicine

## 2017-09-30 ENCOUNTER — Encounter: Payer: Self-pay | Admitting: Family Medicine

## 2017-09-30 VITALS — BP 124/88 | HR 76 | Ht 65.0 in | Wt 192.0 lb

## 2017-09-30 DIAGNOSIS — M999 Biomechanical lesion, unspecified: Secondary | ICD-10-CM

## 2017-09-30 DIAGNOSIS — M545 Low back pain, unspecified: Secondary | ICD-10-CM

## 2017-09-30 MED ORDER — PREDNISONE 50 MG PO TABS: 50.0000 mg | ORAL_TABLET | Freq: Every day | ORAL | 0 refills | Status: DC

## 2017-09-30 MED ORDER — GABAPENTIN 100 MG PO CAPS: 200.0000 mg | ORAL_CAPSULE | Freq: Every day | ORAL | 3 refills | Status: DC

## 2017-09-30 NOTE — Patient Instructions (Addendum)
Good to see you  Diana King is your friend. Ice 20 minutes 2 times daily. Usually after activity and before bed. Xrays downstairs Predniosne daily for 5 days if not better tomorrow  Gabapentin at night  See me again in 2 weeks

## 2017-09-30 NOTE — Assessment & Plan Note (Signed)
Decision today to treat with OMT was based on Physical Exam  After verbal consent patient was treated with HVLA, ME, FPR techniques in  lumbar and sacral areas  Patient tolerated the procedure well with improvement in symptoms  Patient given exercises, stretches and lifestyle modifications  See medications in patient instructions if given  Patient will follow up in 3 weeks

## 2017-09-30 NOTE — Progress Notes (Signed)
[name] [address] [place], [place] [address] Phone: [phone] Subjective:   Bruce Donath, am serving as a scribe for Dr. Antoine Primas.   CC: Back pain follow-up  [id number]  Diana King is a 27 y.o. female coming in with complaint of back pain. She has pain in lumbar spine since Friday. Pain bilaterally. Intermittent. She thought she had kidney stones. Did have burning in lower back as well. Took Tylenol. Mid back pain started on Sunday and is now going into the cervical spine. Did get headache. Is using Duexis and Zanaflex to help with pain.      Past Medical History:  Diagnosis Date  . GERD (gastroesophageal reflux disease)    Past Surgical History:  Procedure Laterality Date  . NO PAST SURGERIES     Social History   Socioeconomic History  . Marital status: Married    Spouse name: Amalia Hailey  . Number of children: 1  . Years of education: Not on file  . Highest education level: Not on file  Occupational History  . Not on file  Social Needs  . Financial resource strain: Not on file  . Food insecurity:    Worry: Not on file    Inability: Not on file  . Transportation needs:    Medical: Not on file    Non-medical: Not on file  Tobacco Use  . Smoking status: Never Smoker  . Smokeless tobacco: Never Used  Substance and Sexual Activity  . Alcohol use: Yes    Frequency: Never    Comment: social  . Drug use: Not on file  . Sexual activity: Not on file  Lifestyle  . Physical activity:    Days per week: Not on file    Minutes per session: Not on file  . Stress: Not on file  Relationships  . Social connections:    Talks on phone: Not on file    Gets together: Not on file    Attends religious service: Not on file    Active member of club or organization: Not on file    Attends meetings of clubs or organizations: Not on file    Relationship status: Not on file  Other Topics Concern  . Not on file  Social History  Narrative  . Not on file   Allergies  Allergen Reactions  . Latex Swelling   Family History  Problem Relation Age of Onset  . Fibromyalgia Mother   . Interstitial cystitis Mother   . Migraines Mother   . Alcohol abuse Maternal Aunt   . Mental illness Maternal Aunt   . Alcohol abuse Maternal Uncle   . Thyroid disease Maternal Grandmother     Current Outpatient Medications (Endocrine & Metabolic):  .  drospirenone-ethinyl estradiol (YAZ,GIANVI,LORYNA) 3-0.02 MG tablet, Take 1 tablet by mouth daily. .  predniSONE (DELTASONE) 50 MG tablet, Take 1 tablet (50 mg total) by mouth daily.      Current Outpatient Medications (Other):  .  gabapentin (NEURONTIN) 100 MG capsule, Take 2 capsules (200 mg total) by mouth at bedtime. Marland Kitchen  venlafaxine XR (EFFEXOR XR) 37.5 MG 24 hr capsule, Take 1 capsule (37.5 mg total) by mouth daily with breakfast.    Past medical history, social, surgical and family history all reviewed in electronic medical record.  No pertanent information unless stated regarding to the chief complaint.   Review of Systems:  No headache, visual changes, nausea, vomiting, diarrhea, constipation, dizziness, abdominal pain, skin rash,  fevers, chills, night sweats, weight loss, swollen lymph nodes, body aches, joint swelling, chest pain, shortness of breath, mood changes.  Positive muscle aches  Objective  Blood pressure 124/88, pulse 76, height 5\' 5"  (1.651 m), weight 192 lb (87.1 kg), SpO2 98 %.     General: No apparent distress alert and oriented x3 mood and affect normal, dressed appropriately.  HEENT: Pupils equal, extraocular movements intact  Respiratory: Patient's speak in full sentences and does not appear short of breath  Cardiovascular: No lower extremity edema, non tender, no erythema  Skin: Warm dry intact with no signs of infection or rash on extremities or on axial skeleton.  Abdomen: Soft nontender  Neuro: Cranial nerves II through XII are intact,  neurovascularly intact in all extremities with 2+ DTRs and 2+ pulses.  Lymph: No lymphadenopathy of posterior or anterior cervical chain or axillae bilaterally.  Gait normal with good balance and coordination.  MSK:  Non tender with full range of motion and good stability and symmetric strength and tone of shoulders, elbows, wrist, hip, knee and ankles bilaterally.  Back Exam:  Inspection: Loss of lordosis Motion: Flexion 40 deg, Extension 25 deg, Side Bending to 35 deg bilaterally,  Rotation to 35 deg bilaterally  SLR laying: Negative tightness of the hamstrings XSLR laying: Negative  Palpable tenderness: Tender to palpation paraspinal musculature. FABER: Positive Faber. Sensory change: Gross sensation intact to all lumbar and sacral dermatomes.  Reflexes: 2+ at both patellar tendons, 2+ at achilles tendons, Babinski's downgoing.  Strength at foot  Plantar-flexion: 5/5 Dorsi-flexion: 5/5 Eversion: 5/5 Inversion: 5/5  Leg strength  Quad: 5/5 Hamstring: 5/5 Hip flexor: 5/5 Hip abductors: 5/5  Gait unremarkable.  Osteopathic findings C2 flexed rotated and side bent right T3 extended rotated and side bent right inhaled third rib T6 extended rotated and side bent left L2 flexed rotated and side bent right Sacrum right on right     Impression and Recommendations:     This case required medical decision making of moderate complexity. The above documentation has been reviewed and is accurate and complete Judi Saa, DO       Note: This dictation was prepared with Dragon dictation along with smaller phrase technology. Any transcriptional errors that result from this process are unintentional.

## 2017-09-30 NOTE — Assessment & Plan Note (Signed)
Worsening symptoms at this time.  Still no significant radicular symptoms.  Has been sometime since we have seen her.  Started on gabapentin given prednisone, x-rays ordered today.  Any worsening radicular symptoms will need to further evaluate.  Patient denies any recent injury, any new activities, or any new icing.  Follow-up again 2 weeks

## 2017-10-03 ENCOUNTER — Ambulatory Visit (INDEPENDENT_AMBULATORY_CARE_PROVIDER_SITE_OTHER)
Admission: RE | Admit: 2017-10-03 | Discharge: 2017-10-03 | Disposition: A | Discharge status: Home / Self Care | Payer: 59 | Source: Ambulatory Visit | Attending: Family Medicine | Admitting: Family Medicine

## 2017-10-03 DIAGNOSIS — M545 Low back pain, unspecified: Secondary | ICD-10-CM

## 2017-10-21 NOTE — Progress Notes (Signed)
Subjective:    Patient ID: Diana King, female    DOB: 1990/08/24, 27 y.o.   MRN: [id number]  HPI The patient is here for follow up.  Irritability: She is taking her medication daily as prescribed. She denies any side effects from the medication. She feels her irritability / hormonal shifts is well controlled and she is happy with her current dose of medication. Usually her symptoms are the week prior to her menses and she any increased symptoms-she is currently a week before her menses.  One concern she has she does feel more tired since stopping the Wellbutrin.  When she was on the Wellbutrin was easier to control her weight, she did not require coffee on a regular basis and had more energy.  She is frustrated with those symptoms.  Medications and allergies reviewed with patient and updated if appropriate.  Patient Active Problem List   Diagnosis Date Noted  . Nonallopathic lesion of sacral region 09/30/2017  . Nonallopathic lesion of lumbosacral region 06/18/2017  . Irritability 06/11/2017  . Low back pain 06/11/2017  . Obesity (BMI 30.0-34.9) 06/11/2017    Current Outpatient Medications on File Prior to Visit  Medication Sig Dispense Refill  . drospirenone-ethinyl estradiol (YAZ,GIANVI,LORYNA) 3-0.02 MG tablet Take 1 tablet by mouth daily.    Marland Kitchen venlafaxine XR (EFFEXOR XR) 37.5 MG 24 hr capsule Take 1 capsule (37.5 mg total) by mouth daily with breakfast. 30 capsule 5   No current facility-administered medications on file prior to visit.     Past Medical History:  Diagnosis Date  . GERD (gastroesophageal reflux disease)     Past Surgical History:  Procedure Laterality Date  . NO PAST SURGERIES      Social History   Socioeconomic History  . Marital status: Married    Spouse name: Amalia Hailey  . Number of children: 1  . Years of education: Not on file  . Highest education level: Not on file  Occupational History  . Not on file  Social Needs  . Financial  resource strain: Not on file  . Food insecurity:    Worry: Not on file    Inability: Not on file  . Transportation needs:    Medical: Not on file    Non-medical: Not on file  Tobacco Use  . Smoking status: Never Smoker  . Smokeless tobacco: Never Used  Substance and Sexual Activity  . Alcohol use: Yes    Frequency: Never    Comment: social  . Drug use: Not on file  . Sexual activity: Not on file  Lifestyle  . Physical activity:    Days per week: Not on file    Minutes per session: Not on file  . Stress: Not on file  Relationships  . Social connections:    Talks on phone: Not on file    Gets together: Not on file    Attends religious service: Not on file    Active member of club or organization: Not on file    Attends meetings of clubs or organizations: Not on file    Relationship status: Not on file  Other Topics Concern  . Not on file  Social History Narrative  . Not on file    Family History  Problem Relation Age of Onset  . Fibromyalgia Mother   . Interstitial cystitis Mother   . Migraines Mother   . Alcohol abuse Maternal Aunt   . Mental illness Maternal Aunt   . Alcohol abuse Maternal Uncle   .  Thyroid disease Maternal Grandmother     Review of Systems  Constitutional: Positive for appetite change (Increased) and fatigue.  Psychiatric/Behavioral: Negative for dysphoric mood and sleep disturbance. The patient is nervous/anxious (Controlled).        Objective:   Vitals:   10/22/17 1445  BP: 128/76  Pulse: 86  Resp: 16  Temp: 98.9 F (37.2 C)  SpO2: 98%   BP Readings from Last 3 Encounters:  10/22/17 128/76  09/30/17 124/88  06/11/17 104/60   Wt Readings from Last 3 Encounters:  10/22/17 192 lb (87.1 kg)  09/30/17 192 lb (87.1 kg)  06/11/17 192 lb (87.1 kg)   Body mass index is 31.95 kg/m.   Physical Exam  Constitutional: She appears well-developed and well-nourished. No distress.  HENT:  Head: Atraumatic.  Skin: She is not  diaphoretic.  Psychiatric: She has a normal mood and affect. Her behavior is normal. Judgment and thought content normal.           Assessment & Plan:    See Problem List for Assessment and Plan of chronic medical problems.

## 2017-10-22 ENCOUNTER — Other Ambulatory Visit: Payer: Self-pay | Admitting: Family Medicine

## 2017-10-22 ENCOUNTER — Ambulatory Visit: Payer: 59 | Admitting: Internal Medicine

## 2017-10-22 ENCOUNTER — Encounter: Payer: Self-pay | Admitting: Internal Medicine

## 2017-10-22 VITALS — BP 128/76 | HR 86 | Temp 98.9°F | Resp 16 | Ht 65.0 in | Wt 192.0 lb

## 2017-10-22 DIAGNOSIS — R454 Irritability and anger: Secondary | ICD-10-CM

## 2017-10-22 DIAGNOSIS — R5383 Other fatigue: Secondary | ICD-10-CM | POA: Diagnosis not present

## 2017-10-22 MED ORDER — VENLAFAXINE HCL ER 37.5 MG PO CP24: 37.5000 mg | ORAL_CAPSULE | Freq: Every day | ORAL | 1 refills | Status: DC

## 2017-10-22 MED ORDER — BUPROPION HCL ER (XL) 150 MG PO TB24: 150.0000 mg | ORAL_TABLET | Freq: Every day | ORAL | 1 refills | Status: DC

## 2017-10-22 NOTE — Telephone Encounter (Signed)
Refill done.  

## 2017-10-22 NOTE — Assessment & Plan Note (Signed)
Irritability, anxiety well controlled with current dose of Effexor-continue Since she is experiencing increased fatigue, weight gain, decreased motivation, increased appetite we will add back Wellbutrin Discussed with her the risks of both medication-since she is on a low-dose of both risks are likely minimal We will monitor closely

## 2017-10-22 NOTE — Assessment & Plan Note (Signed)
She again is experiencing fatigue, weight gain, making her coffee, some decreased motivation have not been much better when she was on Wellbutrin Will restart Wellbutrin. She is on a low dose of Effexor to believe the combination of both of these medication at a low dose is safe.  She is low risk for seizures. We will try both medications over the next month and see how she does

## 2017-10-22 NOTE — Patient Instructions (Addendum)
Start wellbutrin again in the morning.  Continue the effexor.    Update me on how you are doing with both.

## 2017-10-27 ENCOUNTER — Other Ambulatory Visit: Payer: Self-pay

## 2017-10-27 MED ORDER — VENLAFAXINE HCL ER 37.5 MG PO CP24: 37.5000 mg | ORAL_CAPSULE | Freq: Every day | ORAL | 1 refills | Status: DC

## 2017-11-10 ENCOUNTER — Other Ambulatory Visit: Payer: Self-pay | Admitting: Family

## 2017-11-10 MED ORDER — BETAMETHASONE DIPROPIONATE 0.05 % EX CREA: TOPICAL_CREAM | Freq: Two times a day (BID) | CUTANEOUS | 0 refills | Status: DC

## 2017-12-29 ENCOUNTER — Other Ambulatory Visit: Payer: Self-pay

## 2017-12-29 MED ORDER — VENLAFAXINE HCL ER 37.5 MG PO CP24: 37.5000 mg | ORAL_CAPSULE | Freq: Every day | ORAL | 1 refills | Status: DC

## 2017-12-29 MED ORDER — BUPROPION HCL ER (XL) 150 MG PO TB24: 150.0000 mg | ORAL_TABLET | Freq: Every day | ORAL | 1 refills | Status: DC

## 2018-04-24 ENCOUNTER — Telehealth (INDEPENDENT_AMBULATORY_CARE_PROVIDER_SITE_OTHER): Payer: 59 | Admitting: Internal Medicine

## 2018-04-24 NOTE — Progress Notes (Signed)
Error

## 2018-05-21 ENCOUNTER — Ambulatory Visit (INDEPENDENT_AMBULATORY_CARE_PROVIDER_SITE_OTHER): Payer: 59 | Admitting: Internal Medicine

## 2018-05-21 ENCOUNTER — Encounter: Payer: Self-pay | Admitting: Internal Medicine

## 2018-05-21 DIAGNOSIS — R002 Palpitations: Secondary | ICD-10-CM | POA: Diagnosis not present

## 2018-05-21 DIAGNOSIS — F439 Reaction to severe stress, unspecified: Secondary | ICD-10-CM

## 2018-05-21 MED ORDER — LORAZEPAM 1 MG PO TABS: 0.5000 mg | ORAL_TABLET | Freq: Two times a day (BID) | ORAL | 0 refills | Status: DC | PRN

## 2018-05-21 NOTE — Progress Notes (Signed)
Subjective:    Patient ID: Diana King, female    DOB: 1990/11/15, 28 y.o.   MRN: [id number]  HPI The patient is here for follow up.  She was seen yesterday by another provider for palpitations.  She noticed this throughout the day yesterday.  She did consume more caffeine than usual, but also felt anxious.  Her EKG showed frequent PVCs and she did have some bigeminy.  She states the palpitations have improved, but she still feels them.  She did hold both of her antidepressant medications this morning.  She has been experiencing both anxiety and depression over the past year or so.  She describes some of this is irritability.  She states she has gotten into a funk and is not eating as well as she should be or exercising and that is contributing.  She states decreased motivation and has not been doing all of the things she wants and needs to do.  There is also increased stress focus around the coronavirus situation.  Anxiety, irritability, depression: She has been taking her Wellbutrin and Effexor as prescribed.  She was on Wellbutrin by itself for a while and it did not seem to be quite enough.  She was doing well for a while on both medications, but at this point still feels irritable, depressed, anxious-she is not quite sure what she feels.  Elevated blood pressure: Her blood pressures typically controlled, but it is elevated here today.    Medications and allergies reviewed with patient and updated if appropriate.  Patient Active Problem List   Diagnosis Date Noted  . Palpitations 05/21/2018  . Stress at home 05/21/2018  . Fatigue 10/22/2017  . Nonallopathic lesion of sacral region 09/30/2017  . Nonallopathic lesion of lumbosacral region 06/18/2017  . Irritability 06/11/2017  . Low back pain 06/11/2017  . Obesity (BMI 30.0-34.9) 06/11/2017    Current Outpatient Medications on File Prior to Visit  Medication Sig Dispense Refill  . betamethasone dipropionate (DIPROLENE)  0.05 % cream Apply topically 2 (two) times daily. 30 g 0  . buPROPion (WELLBUTRIN XL) 150 MG 24 hr tablet Take 1 tablet (150 mg total) by mouth daily. 90 tablet 1  . drospirenone-ethinyl estradiol (YAZ,GIANVI,LORYNA) 3-0.02 MG tablet Take 1 tablet by mouth daily.    Marland Kitchen LORazepam (ATIVAN) 1 MG tablet Take 0.5-1 tablets (0.5-1 mg total) by mouth 2 (two) times daily as needed for anxiety. 30 tablet 0  . venlafaxine XR (EFFEXOR XR) 37.5 MG 24 hr capsule Take 1 capsule (37.5 mg total) by mouth daily with breakfast. 90 capsule 1   No current facility-administered medications on file prior to visit.     Past Medical History:  Diagnosis Date  . GERD (gastroesophageal reflux disease)     Past Surgical History:  Procedure Laterality Date  . NO PAST SURGERIES      Social History   Socioeconomic History  . Marital status: Married    Spouse name: Amalia Hailey  . Number of children: 1  . Years of education: Not on file  . Highest education level: Not on file  Occupational History  . Not on file  Social Needs  . Financial resource strain: Not on file  . Food insecurity:    Worry: Not on file    Inability: Not on file  . Transportation needs:    Medical: Not on file    Non-medical: Not on file  Tobacco Use  . Smoking status: Never Smoker  . Smokeless tobacco: Never Used  Substance and Sexual Activity  . Alcohol use: Yes    Frequency: Never    Comment: social  . Drug use: Not on file  . Sexual activity: Not on file  Lifestyle  . Physical activity:    Days per week: Not on file    Minutes per session: Not on file  . Stress: Not on file  Relationships  . Social connections:    Talks on phone: Not on file    Gets together: Not on file    Attends religious service: Not on file    Active member of club or organization: Not on file    Attends meetings of clubs or organizations: Not on file    Relationship status: Not on file  Other Topics Concern  . Not on file  Social History  Narrative  . Not on file    Family History  Problem Relation Age of Onset  . Fibromyalgia Mother   . Interstitial cystitis Mother   . Migraines Mother   . Alcohol abuse Maternal Aunt   . Mental illness Maternal Aunt   . Alcohol abuse Maternal Uncle   . Thyroid disease Maternal Grandmother     Review of Systems  Constitutional: Negative for chills and fever.  Respiratory: Negative for cough, shortness of breath and wheezing.   Cardiovascular: Positive for palpitations. Negative for chest pain.  Neurological: Negative for dizziness, light-headedness and headaches.       Objective:   Vitals:   05/22/18 0818  BP: (!) 144/82  Pulse: 88  Resp: 16  Temp: 98.3 F (36.8 C)  SpO2: 98%   BP Readings from Last 3 Encounters:  05/22/18 (!) 144/82  10/22/17 128/76  09/30/17 124/88   Wt Readings from Last 3 Encounters:  05/22/18 194 lb (88 kg)  10/22/17 192 lb (87.1 kg)  09/30/17 192 lb (87.1 kg)   Body mass index is 32.28 kg/m.   Physical Exam    Constitutional: Appears well-developed and well-nourished. No distress.  HENT:  Head: Normocephalic and atraumatic.  Cardiovascular: Normal rate, regular rhythm with occasional premature beats-likely PVCs given EKG from yesterday. No murmur heard.  .  No edema Pulmonary/Chest: Effort normal and breath sounds normal. No respiratory distress. No has no wheezes. No rales.  Skin: Skin is warm and dry. Not diaphoretic.  Psychiatric: Normal mood and affect. Behavior is normal.      Assessment & Plan:    See Problem List for Assessment and Plan of chronic medical problems.

## 2018-05-21 NOTE — Assessment & Plan Note (Addendum)
EKG with NSR.  PVCs with runs of bigeminy.  This could be related to Wellbutrin and/for Effexor.  Diana King will not take Wellbutrin and Effexor tomorrow morning.  No caffeine.  She has an appointment to see Dr. Lawerance Bach tomorrow. Go to ER if problems Give her a small prescription for lorazepam for stress at home/anxiety just in case.

## 2018-05-21 NOTE — Progress Notes (Signed)
Subjective:  Patient ID: Diana King, female    DOB: [birth year]  Age: 28 y.o. MRN: [id number]  CC: No chief complaint on file.   HPI Diana SlackBriana R King is c/o palpitations starting this morning - all day.  No chest pain, lightheadedness, syncope, dizziness.  Diana ColeBriana has been on Wellbutrin XL 150 mg daily for a year and Effexor XR 37.5 mg daily for 6 months.  There is some stress present at home...  Outpatient Medications Prior to Visit  Medication Sig Dispense Refill  . betamethasone dipropionate (DIPROLENE) 0.05 % cream Apply topically 2 (two) times daily. 30 g 0  . buPROPion (WELLBUTRIN XL) 150 MG 24 hr tablet Take 1 tablet (150 mg total) by mouth daily. 90 tablet 1  . drospirenone-ethinyl estradiol (YAZ,GIANVI,LORYNA) 3-0.02 MG tablet Take 1 tablet by mouth daily.    Marland Kitchen. gabapentin (NEURONTIN) 100 MG capsule TAKE 2 CAPSULES BY MOUTH AT BEDTIME. 180 capsule 1  . venlafaxine XR (EFFEXOR XR) 37.5 MG 24 hr capsule Take 1 capsule (37.5 mg total) by mouth daily with breakfast. 90 capsule 1   No facility-administered medications prior to visit.     ROS: Review of Systems  Constitutional: Negative for fatigue.  Respiratory: Negative for shortness of breath and wheezing.   Cardiovascular: Positive for palpitations. Negative for chest pain and leg swelling.  Neurological: Negative for dizziness.  Psychiatric/Behavioral: The patient is nervous/anxious.     Objective:  There were no vitals taken for this visit.  BP Readings from Last 3 Encounters:  10/22/17 128/76  09/30/17 124/88  06/11/17 104/60    Wt Readings from Last 3 Encounters:  10/22/17 192 lb (87.1 kg)  09/30/17 192 lb (87.1 kg)  06/11/17 192 lb (87.1 kg)    Physical Exam Constitutional:      General: She is not in acute distress.    Appearance: Normal appearance. She is not toxic-appearing.  Cardiovascular:     Rate and Rhythm: Normal rate. Rhythm irregular.  Pulmonary:     Breath sounds: No rhonchi.   Neurological:     Mental Status: She is alert and oriented to person, place, and time.     Coordination: Coordination abnormal.  Psychiatric:        Mood and Affect: Mood normal.        Behavior: Behavior normal.   Regular rhythm with frequent irregular beats  Procedure: EKG Indication: Palpitations Impression: NSR.  PVCs with runs of bigeminy   Lab Results  Component Value Date   WBC 7.6 06/13/2017   HGB 12.9 06/13/2017   HCT 38.9 06/13/2017   PLT 362.0 06/13/2017   GLUCOSE 102 (H) 06/13/2017   CHOL 175 06/13/2017   TRIG 164.0 (H) 06/13/2017   HDL 49.20 06/13/2017   LDLCALC 93 06/13/2017   ALT 11 06/13/2017   AST 13 06/13/2017   NA 139 06/13/2017   K 4.2 06/13/2017   CL 104 06/13/2017   CREATININE 0.85 06/13/2017   BUN 14 06/13/2017   CO2 25 06/13/2017   TSH 3.17 06/13/2017    Dg Lumbar Spine 2-3 Views  Result Date: 10/03/2017 CLINICAL DATA:  Lumbago EXAM: LUMBAR SPINE - 2-3 VIEW COMPARISON:  None. FINDINGS: Frontal, lateral, and spot lumbosacral lateral images were obtained. There are 4 strictly non-rib-bearing lumbar type vertebral bodies. There is mild elongation of the L1 transverse processes bilaterally. There is slight lower lumbar levoscoliosis. There is no fracture or spondylolisthesis. The disc spaces appear normal. No appreciable facet arthropathy. IMPRESSION: Slight scoliosis. No fracture  or spondylolisthesis. No appreciable arthropathy. Electronically Signed   By: Bretta Bang III M.D.   On: 10/03/2017 10:18    Assessment & Plan:   Diagnoses and all orders for this visit:  Palpitations -     EKG 12-Lead     No orders of the defined types were placed in this encounter.    Follow-up: No follow-ups on file.  Sonda Primes, MD

## 2018-05-21 NOTE — Assessment & Plan Note (Addendum)
I gave Diana King a small prescription for lorazepam for stress at home/anxiety just in case.

## 2018-05-22 ENCOUNTER — Other Ambulatory Visit: Payer: Self-pay

## 2018-05-22 ENCOUNTER — Ambulatory Visit: Payer: 59 | Admitting: Internal Medicine

## 2018-05-22 ENCOUNTER — Other Ambulatory Visit (INDEPENDENT_AMBULATORY_CARE_PROVIDER_SITE_OTHER): Payer: 59

## 2018-05-22 ENCOUNTER — Encounter: Payer: Self-pay | Admitting: Internal Medicine

## 2018-05-22 VITALS — BP 144/82 | HR 88 | Temp 98.3°F | Resp 16 | Ht 65.0 in | Wt 194.0 lb

## 2018-05-22 DIAGNOSIS — Z0000 Encounter for general adult medical examination without abnormal findings: Secondary | ICD-10-CM

## 2018-05-22 DIAGNOSIS — R454 Irritability and anger: Secondary | ICD-10-CM

## 2018-05-22 DIAGNOSIS — R002 Palpitations: Secondary | ICD-10-CM

## 2018-05-22 DIAGNOSIS — R030 Elevated blood-pressure reading, without diagnosis of hypertension: Secondary | ICD-10-CM

## 2018-05-22 LAB — CBC WITH DIFFERENTIAL/PLATELET
Basophils Absolute: 0.1 10*3/uL (ref 0.0–0.1)
Basophils Relative: 1 % (ref 0.0–3.0)
Eosinophils Absolute: 0.1 10*3/uL (ref 0.0–0.7)
Eosinophils Relative: 0.9 % (ref 0.0–5.0)
HCT: 41.6 % (ref 36.0–46.0)
Hemoglobin: 14 g/dL (ref 12.0–15.0)
Lymphocytes Relative: 33 % (ref 12.0–46.0)
Lymphs Abs: 3.5 10*3/uL (ref 0.7–4.0)
MCHC: 33.7 g/dL (ref 30.0–36.0)
MCV: 83 fl (ref 78.0–100.0)
Monocytes Absolute: 0.8 10*3/uL (ref 0.1–1.0)
Monocytes Relative: 7.8 % (ref 3.0–12.0)
Neutro Abs: 6.1 10*3/uL (ref 1.4–7.7)
Neutrophils Relative %: 57.3 % (ref 43.0–77.0)
Platelets: 417 10*3/uL — ABNORMAL HIGH (ref 150.0–400.0)
RBC: 5.01 Mil/uL (ref 3.87–5.11)
RDW: 13.1 % (ref 11.5–15.5)
WBC: 10.7 10*3/uL — ABNORMAL HIGH (ref 4.0–10.5)

## 2018-05-22 LAB — LIPID PANEL
Cholesterol: 205 mg/dL — ABNORMAL HIGH (ref 0–200)
HDL: 59.5 mg/dL (ref >39.00)
LDL Cholesterol: 107 mg/dL — ABNORMAL HIGH (ref 0–99)
NonHDL: 145.39
Total CHOL/HDL Ratio: 3
Triglycerides: 194 mg/dL — ABNORMAL HIGH (ref 0.0–149.0)
VLDL: 38.8 mg/dL (ref 0.0–40.0)

## 2018-05-22 LAB — COMPREHENSIVE METABOLIC PANEL
ALT: 11 U/L (ref 0–35)
AST: 12 U/L (ref 0–37)
Albumin: 4.4 g/dL (ref 3.5–5.2)
Alkaline Phosphatase: 43 U/L (ref 39–117)
BUN: 12 mg/dL (ref 6–23)
CO2: 22 mEq/L (ref 19–32)
Calcium: 9.1 mg/dL (ref 8.4–10.5)
Chloride: 103 mEq/L (ref 96–112)
Creatinine, Ser: 0.78 mg/dL (ref 0.40–1.20)
GFR: 86.6 mL/min (ref >60.00)
Glucose, Bld: 88 mg/dL (ref 70–99)
Potassium: 4.3 mEq/L (ref 3.5–5.1)
Sodium: 137 mEq/L (ref 135–145)
Total Bilirubin: 0.3 mg/dL (ref 0.2–1.2)
Total Protein: 7.6 g/dL (ref 6.0–8.3)

## 2018-05-22 LAB — TSH: TSH: 2.55 u[IU]/mL (ref 0.35–4.50)

## 2018-05-22 MED ORDER — BUSPIRONE HCL 5 MG PO TABS: 5.0000 mg | ORAL_TABLET | Freq: Three times a day (TID) | ORAL | 2 refills | Status: DC

## 2018-05-22 NOTE — Assessment & Plan Note (Signed)
EKG from yesterday shows PVCs with runs of bigeminy Possibly related to Wellbutrin and Effexor She did not take either today and we will discontinue the Effexor for now She did do okay previously on the Wellbutrin and we will continue that Decrease caffeine intake We will do a trial of buspirone 5 mg 2-3 times a day to see if that helps with anxiety Restart regular exercise, improve diet Also discussed some other lifestyle changes that may help overall with anxiety

## 2018-05-22 NOTE — Assessment & Plan Note (Signed)
She is experiencing irritability and likely has some element of anxiety and depression We discussed all the options of medications and for now we will discontinue the venlafaxine.  If she needs to taper off slowly depending on symptoms that is okay to do Continue Wellbutrin at current dose of 150 mg daily Will try BuSpar 5 mg 2-3 times a day to see if that helps with some of the anxiety.  Discussed possible side effects Can adjust medication if needed

## 2018-05-22 NOTE — Patient Instructions (Signed)
  Tests ordered today. Your results will be released to MyChart (or called to you) after review, usually within 72hours after test completion. If any changes need to be made, you will be notified at that same time.    Medications reviewed and updated.  Changes include :   Start buspar 2-3 times.  Stop effexor.  Continue wellbutrin.  Your prescription(s) have been submitted to your pharmacy. Please take as directed and contact our office if you believe you are having problem(s) with the medication(s).

## 2018-05-22 NOTE — Assessment & Plan Note (Addendum)
This is her first elevated reading We will just monitor for now at work She will try to restart regular exercise and improve her diet Ultimately work on weight loss

## 2018-05-29 ENCOUNTER — Other Ambulatory Visit: Payer: Self-pay | Admitting: Internal Medicine

## 2018-05-29 MED ORDER — VENLAFAXINE HCL 25 MG PO TABS: 25.0000 mg | ORAL_TABLET | Freq: Two times a day (BID) | ORAL | 0 refills | Status: DC

## 2018-06-16 ENCOUNTER — Other Ambulatory Visit: Payer: Self-pay | Admitting: Internal Medicine

## 2018-06-25 ENCOUNTER — Other Ambulatory Visit: Payer: Self-pay | Admitting: Internal Medicine

## 2018-07-01 ENCOUNTER — Other Ambulatory Visit: Payer: Self-pay | Admitting: Internal Medicine

## 2018-07-06 ENCOUNTER — Ambulatory Visit (INDEPENDENT_AMBULATORY_CARE_PROVIDER_SITE_OTHER): Payer: 59 | Admitting: Family Medicine

## 2018-07-06 ENCOUNTER — Encounter: Payer: Self-pay | Admitting: Family Medicine

## 2018-07-06 ENCOUNTER — Other Ambulatory Visit: Payer: Self-pay

## 2018-07-06 DIAGNOSIS — R293 Abnormal posture: Secondary | ICD-10-CM | POA: Insufficient documentation

## 2018-07-06 DIAGNOSIS — M999 Biomechanical lesion, unspecified: Secondary | ICD-10-CM | POA: Diagnosis not present

## 2018-07-06 NOTE — Assessment & Plan Note (Signed)
We discussed posture and ergonomics.  Discussed which activities of doing which also avoid.  Continue the same medication.  Patient is on Wellbutrin at the moment.  Also taking BuSpar at a regular dose but is having difficulty remembering this.  We discussed different medications.  Do believe that the stress is likely contributing.  Follow-up again in 4 to 8 weeks

## 2018-07-06 NOTE — Progress Notes (Signed)
[facility] [place], [place] [address] Phone: [phone] Subjective:      CC: Neck pain, headaches  HKV:[id number]  Diana King is a 28 y.o. female coming in with complaint of headache and neck pain follow-up.  Patient is having more headache.  Does seem to have more stress recently.  Rates the severity of pain is 7 out of 10.  Patient feels it is more of a tightness.  Usually in the lower back, more around the neck this time.  Denies any radiation down the arms or any numbness and tingling.  Describes the pain as a dull ache.  Does respond well to anti-inflammatories     Past Medical History:  Diagnosis Date  . GERD (gastroesophageal reflux disease)    Past Surgical History:  Procedure Laterality Date  . NO PAST SURGERIES     Social History   Socioeconomic History  . Marital status: Married    Spouse name: Diana King  . Number of children: 1  . Years of education: Not on file  . Highest education level: Not on file  Occupational History  . Not on file  Social Needs  . Financial resource strain: Not on file  . Food insecurity    Worry: Not on file    Inability: Not on file  . Transportation needs    Medical: Not on file    Non-medical: Not on file  Tobacco Use  . Smoking status: Never Smoker  . Smokeless tobacco: Never Used  Substance and Sexual Activity  . Alcohol use: Yes    Frequency: Never    Comment: social  . Drug use: Not on file  . Sexual activity: Not on file  Lifestyle  . Physical activity    Days per week: Not on file    Minutes per session: Not on file  . Stress: Not on file  Relationships  . Social Herbalist on phone: Not on file    Gets together: Not on file    Attends religious service: Not on file    Active member of club or organization: Not on file    Attends meetings of clubs or organizations: Not on file    Relationship status: Not on file  Other Topics Concern  . Not on file   Social History Narrative  . Not on file   Allergies  Allergen Reactions  . Latex Swelling   Family History  Problem Relation Age of Onset  . Fibromyalgia Mother   . Interstitial cystitis Mother   . Migraines Mother   . Alcohol abuse Maternal Aunt   . Mental illness Maternal Aunt   . Alcohol abuse Maternal Uncle   . Thyroid disease Maternal Grandmother     Current Outpatient Medications (Endocrine & Metabolic):  .  drospirenone-ethinyl estradiol (YAZ,GIANVI,LORYNA) 3-0.02 MG tablet, Take 1 tablet by mouth daily.      Current Outpatient Medications (Other):  .  betamethasone dipropionate (DIPROLENE) 0.05 % cream, Apply topically 2 (two) times daily. Marland Kitchen  buPROPion (WELLBUTRIN XL) 150 MG 24 hr tablet, TAKE 1 TABLET BY MOUTH EVERY DAY .  busPIRone (BUSPAR) 5 MG tablet, TAKE 1 TABLET BY MOUTH THREE TIMES A DAY .  LORazepam (ATIVAN) 1 MG tablet, Take 0.5-1 tablets (0.5-1 mg total) by mouth 2 (two) times daily as needed for anxiety. Marland Kitchen  venlafaxine (EFFEXOR) 25 MG tablet, TAKE 1 TABLET BY MOUTH TWICE A DAY    Past medical history, social,  surgical and family history all reviewed in electronic medical record.  No pertanent information unless stated regarding to the chief complaint.   Review of Systems:  No visual changes, nausea, vomiting, diarrhea, constipation, dizziness, abdominal pain, skin rash, fevers, chills, night sweats, weight loss, swollen lymph nodes, body aches, joint swelling,  chest pain, shortness of breath, mood changes.   Positiv muscle achese and headaches Objective  There were no vitals taken for this visit.   General: No apparent distress alert and oriented x3 mood and affect normal, dressed appropriately.  HEENT: Pupils equal, extraocular movements intact  Respiratory: Patient's speak in full sentences and does not appear short of breath  Cardiovascular: No lower extremity edema, non tender, no erythema  Skin: Warm dry intact with no signs of infection or rash  on extremities or on axial skeleton.  Abdomen: Soft nontender  Neuro: Cranial nerves II through XII are intact, neurovascularly intact in all extremities with 2+ DTRs and 2+ pulses.  Lymph: No lymphadenopathy of posterior or anterior cervical chain or axillae bilaterally.  Gait normal with good balance and coordination.  MSK:  Non tender with full range of motion and good stability and symmetric strength and tone of shoulders, elbows, wrist, hip, knee and ankles bilaterally.  Neck: Inspection mild loss of lordosis. No palpable stepoffs. Negative Spurling's maneuver. Full neck range of motion Grip strength and sensation normal in bilateral hands Strength good C4 to T1 distribution No sensory change to C4 to T1 Negative Hoffman sign bilaterally Reflexes normal Tightness of the trapezius bilaterally.    Osteopathic findings C2 flexed rotated and side bent right T7 extended rotated and side bent left L4 flexed rotated and side bent left Sacrum right on right     Impression and Recommendations:     This case required medical decision making of moderate complexity. The above documentation has been reviewed and is accurate and complete Diana SaaZachary M Helix Lafontaine, DO       Note: This dictation was prepared with Dragon dictation along with smaller phrase technology. Any transcriptional errors that result from this process are unintentional.

## 2018-07-06 NOTE — Assessment & Plan Note (Signed)
Decision today to treat with OMT was based on Physical Exam  After verbal consent patient was treated with HVLA, ME, FPR techniques in cervical, thoracic, lumbar and sacral areas  Patient tolerated the procedure well with improvement in symptoms  Patient given exercises, stretches and lifestyle modifications  See medications in patient instructions if given  Patient will follow up in 4-8 weeks 

## 2018-08-31 ENCOUNTER — Encounter: Payer: Self-pay | Admitting: Internal Medicine

## 2018-08-31 ENCOUNTER — Ambulatory Visit (INDEPENDENT_AMBULATORY_CARE_PROVIDER_SITE_OTHER): Payer: 59 | Admitting: Internal Medicine

## 2018-08-31 ENCOUNTER — Other Ambulatory Visit: Payer: Self-pay

## 2018-08-31 DIAGNOSIS — F329 Major depressive disorder, single episode, unspecified: Secondary | ICD-10-CM | POA: Diagnosis not present

## 2018-08-31 DIAGNOSIS — F32A Depression, unspecified: Secondary | ICD-10-CM

## 2018-08-31 DIAGNOSIS — F419 Anxiety disorder, unspecified: Secondary | ICD-10-CM | POA: Diagnosis not present

## 2018-08-31 MED ORDER — SERTRALINE HCL 50 MG PO TABS: 50.0000 mg | ORAL_TABLET | Freq: Every day | ORAL | 5 refills | Status: DC

## 2018-08-31 NOTE — Patient Instructions (Signed)
Start sertraline nightly.  Continue your other medications.     [facility] [place]  [facility], [place]  [facility] [place], [place], [place], [address] [phone] Office [phone] [facility]            [place], [place]

## 2018-08-31 NOTE — Assessment & Plan Note (Addendum)
wellbutrin helps mostly with energy - not much help for depression. She is experiencing a combination of anxiety, depression and irritability Think it would be beneficial for her to see a therapist-names and numbers given Discussed possibly seeing a psychiatrist to help care, right medication since we have had difficulty finding one that is effective and she tolerates-she will think about this Trial of sertraline 50 mg nightly. Continue Wellbutrin and BuSpar for now She will follow-up with me in 2 life 4 weeks with an update so we can make adjustments, sooner if needed

## 2018-08-31 NOTE — Progress Notes (Signed)
Subjective:    Patient ID: Diana King, female    DOB: Mar 27, 1990, 28 y.o.   MRN: [id number]  HPI The patient is here for an acute visit.  Depression, anxiety, irritability: She is taking Wellbutrin daily and using the BuSpar 1-2 times a day.  Wellbutrin does seem to help with her energy level, but does not seem to do much for her depression.  She does feel depressed.  She has lack of motivation and less interest in doing things.  All of this got worse with COVID.  She does find herself overwhelmed at times, irritable and feeling stressed.  She is neck stiffness because of the stress.  Over the weekend she found herself binge eating until she felt nauseous.  At times she does not want to be around anyone or gets irritated with her family.  She has not been sleeping well-she has frequent dreams that do not make any sense.  She is not currently exercising and does not have the motivation to do that.  She has been on 3 other medications in the past when she is either had side effects or they have not worked well.   Medications and allergies reviewed with patient and updated if appropriate.  Patient Active Problem List   Diagnosis Date Noted  . Poor posture 07/06/2018  . Elevated blood pressure reading 05/22/2018  . Palpitations 05/21/2018  . Stress at home 05/21/2018  . Fatigue 10/22/2017  . Nonallopathic lesion of sacral region 09/30/2017  . Nonallopathic lesion of lumbosacral region 06/18/2017  . Irritability 06/11/2017  . Low back pain 06/11/2017  . Obesity (BMI 30.0-34.9) 06/11/2017    Current Outpatient Medications on File Prior to Visit  Medication Sig Dispense Refill  . betamethasone dipropionate (DIPROLENE) 0.05 % cream Apply topically 2 (two) times daily. 30 g 0  . buPROPion (WELLBUTRIN XL) 150 MG 24 hr tablet TAKE 1 TABLET BY MOUTH EVERY DAY 90 tablet 1  . busPIRone (BUSPAR) 5 MG tablet TAKE 1 TABLET BY MOUTH THREE TIMES A DAY 270 tablet 1  . drospirenone-ethinyl  estradiol (YAZ,GIANVI,LORYNA) 3-0.02 MG tablet Take 1 tablet by mouth daily.     No current facility-administered medications on file prior to visit.     Past Medical History:  Diagnosis Date  . GERD (gastroesophageal reflux disease)     Past Surgical History:  Procedure Laterality Date  . NO PAST SURGERIES      Social History   Socioeconomic History  . Marital status: Married    Spouse name: Amalia HaileyDustin  . Number of children: 1  . Years of education: Not on file  . Highest education level: Not on file  Occupational History  . Not on file  Social Needs  . Financial resource strain: Not on file  . Food insecurity    Worry: Not on file    Inability: Not on file  . Transportation needs    Medical: Not on file    Non-medical: Not on file  Tobacco Use  . Smoking status: Never Smoker  . Smokeless tobacco: Never Used  Substance and Sexual Activity  . Alcohol use: Yes    Frequency: Never    Comment: social  . Drug use: Not on file  . Sexual activity: Not on file  Lifestyle  . Physical activity    Days per week: Not on file    Minutes per session: Not on file  . Stress: Not on file  Relationships  . Social connections  Talks on phone: Not on file    Gets together: Not on file    Attends religious service: Not on file    Active member of club or organization: Not on file    Attends meetings of clubs or organizations: Not on file    Relationship status: Not on file  Other Topics Concern  . Not on file  Social History Narrative  . Not on file    Family History  Problem Relation Age of Onset  . Fibromyalgia Mother   . Interstitial cystitis Mother   . Migraines Mother   . Alcohol abuse Maternal Aunt   . Mental illness Maternal Aunt   . Alcohol abuse Maternal Uncle   . Thyroid disease Maternal Grandmother     Review of Systems  Constitutional: Negative for fever.  Respiratory: Negative for shortness of breath.   Cardiovascular: Negative for chest pain and  palpitations.  Psychiatric/Behavioral: Positive for dysphoric mood and sleep disturbance. The patient is nervous/anxious.        Objective:   Vitals:   08/31/18 1029  BP: 116/72  Pulse: 88  Resp: 16  Temp: 98.6 F (37 C)  SpO2: 98%   BP Readings from Last 3 Encounters:  08/31/18 116/72  05/22/18 (!) 144/82  10/22/17 128/76   Wt Readings from Last 3 Encounters:  08/31/18 193 lb 12.8 oz (87.9 kg)  05/22/18 194 lb (88 kg)  10/22/17 192 lb (87.1 kg)   Body mass index is 32.25 kg/m.   Physical Exam Constitutional:      General: She is not in acute distress.    Appearance: Normal appearance. She is not ill-appearing.  HENT:     Head: Normocephalic and atraumatic.  Skin:    General: Skin is warm and dry.  Neurological:     Mental Status: She is alert.  Psychiatric:        Behavior: Behavior normal.        Thought Content: Thought content normal.        Judgment: Judgment normal.     Comments: Anxious mood and affect          Office Visit from 08/31/2018 in [facility]  PHQ-9 Total Score  16         Assessment & Plan:    See Problem List for Assessment and Plan of chronic medical problems.

## 2018-09-22 ENCOUNTER — Other Ambulatory Visit: Payer: Self-pay | Admitting: Internal Medicine

## 2018-11-17 ENCOUNTER — Encounter: Payer: Self-pay | Admitting: Internal Medicine

## 2019-01-05 ENCOUNTER — Other Ambulatory Visit: Payer: Self-pay | Admitting: Internal Medicine

## 2019-01-09 ENCOUNTER — Other Ambulatory Visit: Payer: Self-pay | Admitting: Internal Medicine

## 2019-02-23 ENCOUNTER — Telehealth: Payer: Self-pay

## 2019-02-23 DIAGNOSIS — Z0000 Encounter for general adult medical examination without abnormal findings: Secondary | ICD-10-CM

## 2019-02-23 NOTE — Telephone Encounter (Signed)
Patient would like labs put in before her appt on 3/12 please.

## 2019-02-23 NOTE — Telephone Encounter (Signed)
Ordered

## 2019-04-01 ENCOUNTER — Other Ambulatory Visit (INDEPENDENT_AMBULATORY_CARE_PROVIDER_SITE_OTHER): Payer: 59

## 2019-04-01 ENCOUNTER — Other Ambulatory Visit: Payer: Self-pay

## 2019-04-01 ENCOUNTER — Encounter: Payer: Self-pay | Admitting: Internal Medicine

## 2019-04-01 DIAGNOSIS — Z0000 Encounter for general adult medical examination without abnormal findings: Secondary | ICD-10-CM | POA: Diagnosis not present

## 2019-04-01 LAB — COMPREHENSIVE METABOLIC PANEL
ALT: 18 U/L (ref 0–35)
AST: 16 U/L (ref 0–37)
Albumin: 3.9 g/dL (ref 3.5–5.2)
Alkaline Phosphatase: 44 U/L (ref 39–117)
BUN: 12 mg/dL (ref 6–23)
CO2: 25 mEq/L (ref 19–32)
Calcium: 9.1 mg/dL (ref 8.4–10.5)
Chloride: 101 mEq/L (ref 96–112)
Creatinine, Ser: 0.84 mg/dL (ref 0.40–1.20)
GFR: 79.05 mL/min (ref >60.00)
Glucose, Bld: 89 mg/dL (ref 70–99)
Potassium: 4 mEq/L (ref 3.5–5.1)
Sodium: 135 mEq/L (ref 135–145)
Total Bilirubin: 0.3 mg/dL (ref 0.2–1.2)
Total Protein: 7.2 g/dL (ref 6.0–8.3)

## 2019-04-01 LAB — LIPID PANEL
Cholesterol: 161 mg/dL (ref 0–200)
HDL: 49.7 mg/dL (ref >39.00)
LDL Cholesterol: 81 mg/dL (ref 0–99)
NonHDL: 111.1
Total CHOL/HDL Ratio: 3
Triglycerides: 150 mg/dL — ABNORMAL HIGH (ref 0.0–149.0)
VLDL: 30 mg/dL (ref 0.0–40.0)

## 2019-04-01 LAB — CBC WITH DIFFERENTIAL/PLATELET
Basophils Absolute: 0.1 10*3/uL (ref 0.0–0.1)
Basophils Relative: 0.6 % (ref 0.0–3.0)
Eosinophils Absolute: 0.1 10*3/uL (ref 0.0–0.7)
Eosinophils Relative: 1 % (ref 0.0–5.0)
HCT: 40.3 % (ref 36.0–46.0)
Hemoglobin: 13.3 g/dL (ref 12.0–15.0)
Lymphocytes Relative: 28.4 % (ref 12.0–46.0)
Lymphs Abs: 2.4 10*3/uL (ref 0.7–4.0)
MCHC: 33.1 g/dL (ref 30.0–36.0)
MCV: 84.1 fl (ref 78.0–100.0)
Monocytes Absolute: 0.7 10*3/uL (ref 0.1–1.0)
Monocytes Relative: 8.7 % (ref 3.0–12.0)
Neutro Abs: 5.2 10*3/uL (ref 1.4–7.7)
Neutrophils Relative %: 61.3 % (ref 43.0–77.0)
Platelets: 377 10*3/uL (ref 150.0–400.0)
RBC: 4.79 Mil/uL (ref 3.87–5.11)
RDW: 14 % (ref 11.5–15.5)
WBC: 8.5 10*3/uL (ref 4.0–10.5)

## 2019-04-01 LAB — TSH: TSH: 1.96 u[IU]/mL (ref 0.35–4.50)

## 2019-04-01 NOTE — Progress Notes (Signed)
Subjective:    Patient ID: Diana King, female    DOB: 11-14-1990, 29 y.o.   MRN: [id number]  HPI She is here for a physical exam.   She has not concerns.  She feels well overall.    Medications and allergies reviewed with patient and updated if appropriate.  Patient Active Problem List   Diagnosis Date Noted  . Poor posture 07/06/2018  . Palpitations 05/21/2018  . Stress at home 05/21/2018  . Nonallopathic lesion of sacral region 09/30/2017  . Nonallopathic lesion of lumbosacral region 06/18/2017  . Anxiety and depression 06/11/2017  . Low back pain 06/11/2017  . Obesity (BMI 30.0-34.9) 06/11/2017    Current Outpatient Medications on File Prior to Visit  Medication Sig Dispense Refill  . buPROPion (WELLBUTRIN XL) 150 MG 24 hr tablet TAKE 1 TABLET BY MOUTH EVERY DAY 90 tablet 1  . busPIRone (BUSPAR) 5 MG tablet TAKE 1 TABLET BY MOUTH THREE TIMES A DAY 270 tablet 1  . drospirenone-ethinyl estradiol (YAZ,GIANVI,LORYNA) 3-0.02 MG tablet Take 1 tablet by mouth daily.    . sertraline (ZOLOFT) 50 MG tablet TAKE 1 TABLET BY MOUTH EVERYDAY AT BEDTIME 90 tablet 2   No current facility-administered medications on file prior to visit.    Past Medical History:  Diagnosis Date  . GERD (gastroesophageal reflux disease)     Past Surgical History:  Procedure Laterality Date  . NO PAST SURGERIES      Social History   Socioeconomic History  . Marital status: Married    Spouse name: Rachel Bo  . Number of children: 1  . Years of education: Not on file  . Highest education level: Not on file  Occupational History  . Not on file  Tobacco Use  . Smoking status: Never Smoker  . Smokeless tobacco: Never Used  Substance and Sexual Activity  . Alcohol use: Yes    Comment: social  . Drug use: Not on file  . Sexual activity: Not on file  Other Topics Concern  . Not on file  Social History Narrative  . Not on file   Social Determinants of Health   Financial Resource  Strain:   . Difficulty of Paying Living Expenses:   Food Insecurity:   . Worried About Charity fundraiser in the Last Year:   . Arboriculturist in the Last Year:   Transportation Needs:   . Film/video editor (Medical):   Marland Kitchen Lack of Transportation (Non-Medical):   Physical Activity:   . Days of Exercise per Week:   . Minutes of Exercise per Session:   Stress:   . Feeling of Stress :   Social Connections:   . Frequency of Communication with Friends and Family:   . Frequency of Social Gatherings with Friends and Family:   . Attends Religious Services:   . Active Member of Clubs or Organizations:   . Attends Archivist Meetings:   Marland Kitchen Marital Status:     Family History  Problem Relation Age of Onset  . Fibromyalgia Mother   . Interstitial cystitis Mother   . Migraines Mother   . Alcohol abuse Maternal Aunt   . Mental illness Maternal Aunt   . Alcohol abuse Maternal Uncle   . Thyroid disease Maternal Grandmother     Review of Systems  Constitutional: Negative for chills and fever.  Eyes: Negative for visual disturbance.  Respiratory: Negative for cough, shortness of breath and wheezing.   Cardiovascular: Negative for chest  pain, palpitations and leg swelling.  Gastrointestinal: Negative for abdominal pain, blood in stool, constipation, diarrhea (loose stools) and nausea.       No gerd  Genitourinary: Negative for dysuria and hematuria.  Musculoskeletal: Positive for arthralgias (left knee - mild, right ankle).  Skin:       acne  Neurological: Negative for light-headedness and headaches.  Psychiatric/Behavioral: Positive for dysphoric mood. Negative for sleep disturbance. The patient is nervous/anxious.        Objective:   Vitals:   04/02/19 0945  BP: 122/70  Pulse: 79  Resp: 16  Temp: 98.4 F (36.9 C)  SpO2: 97%   Filed Weights   04/02/19 0945  Weight: 184 lb (83.5 kg)   Body mass index is 30.62 kg/m.  BP Readings from Last 3 Encounters:    04/02/19 122/70  08/31/18 116/72  05/22/18 (!) 144/82    Wt Readings from Last 3 Encounters:  04/02/19 184 lb (83.5 kg)  08/31/18 193 lb 12.8 oz (87.9 kg)  05/22/18 194 lb (88 kg)     Physical Exam Constitutional: She appears well-developed and well-nourished. No distress.  HENT:  Head: Normocephalic and atraumatic.  Right Ear: External ear normal. Normal ear canal and TM Left Ear: External ear normal.  Normal ear canal and TM Mouth/Throat: Oropharynx is clear and moist.  Eyes: Conjunctivae and EOM are normal.  Neck: Neck supple. No tracheal deviation present. No thyromegaly present.  No carotid bruit  Cardiovascular: Normal rate, regular rhythm and normal heart sounds.   No murmur heard.  No edema. Pulmonary/Chest: Effort normal and breath sounds normal. No respiratory distress. She has no wheezes. She has no rales.  Breast: deferred   Abdominal: Soft. She exhibits no distension. There is no tenderness.  Lymphadenopathy: She has no cervical adenopathy.  Skin: Skin is warm and dry. She is not diaphoretic.  Psychiatric: She has a normal mood and affect. Her behavior is normal.        Assessment & Plan:   Physical exam: Screening blood work    reviewed Immunizations   Up to date  Gyn   Up to date  Exercise  Walking at lunch, working out 5 days a week after work [employer]  Working on [employer] loss Substance abuse  none  See Problem List for Assessment and Plan of chronic medical problems.      This visit occurred during the SARS-CoV-2 public health emergency.  Safety protocols were in place, including screening questions prior to the visit, additional usage of staff PPE, and extensive cleaning of exam room while observing appropriate contact time as indicated for disinfecting solutions.

## 2019-04-01 NOTE — Patient Instructions (Addendum)
Blood work reviewed.     Medications reviewed and updated.  Changes include :   none    Please followup in 1 year     Health Maintenance, Female Adopting a healthy lifestyle and getting preventive care are important in promoting health and wellness. Ask your health care provider about:  The right schedule for you to have regular tests and exams.  Things you can do on your own to prevent diseases and keep yourself healthy. What should I know about diet, weight, and exercise? Eat a healthy diet   Eat a diet that includes plenty of vegetables, fruits, low-fat dairy products, and lean protein.  Do not eat a lot of foods that are high in solid fats, added sugars, or sodium. Maintain a healthy weight Body mass index (BMI) is used to identify weight problems. It estimates body fat based on height and weight. Your health care provider can help determine your BMI and help you achieve or maintain a healthy weight. Get regular exercise Get regular exercise. This is one of the most important things you can do for your health. Most adults should:  Exercise for at least 150 minutes each week. The exercise should increase your heart rate and make you sweat (moderate-intensity exercise).  Do strengthening exercises at least twice a week. This is in addition to the moderate-intensity exercise.  Spend less time sitting. Even light physical activity can be beneficial. Watch cholesterol and blood lipids Have your blood tested for lipids and cholesterol at 32 years of age, then have this test every 5 years. Have your cholesterol levels checked more often if:  Your lipid or cholesterol levels are high.  You are older than 31 years of age.  You are at high risk for heart disease. What should I know about cancer screening? Depending on your health history and family history, you may need to have cancer screening at various ages. This may include screening for:  Breast cancer.  Cervical  cancer.  Colorectal cancer.  Skin cancer.  Lung cancer. What should I know about heart disease, diabetes, and high blood pressure? Blood pressure and heart disease  High blood pressure causes heart disease and increases the risk of stroke. This is more likely to develop in people who have high blood pressure readings, are of African descent, or are overweight.  Have your blood pressure checked: ? Every 3-5 years if you are 56-83 years of age. ? Every year if you are 92 years old or older. Diabetes Have regular diabetes screenings. This checks your fasting blood sugar level. Have the screening done:  Once every three years after age 36 if you are at a normal weight and have a low risk for diabetes.  More often and at a younger age if you are overweight or have a high risk for diabetes. What should I know about preventing infection? Hepatitis B If you have a higher risk for hepatitis B, you should be screened for this virus. Talk with your health care provider to find out if you are at risk for hepatitis B infection. Hepatitis C Testing is recommended for:  Everyone born from [birth year] through 1967.  Anyone with known risk factors for hepatitis C. Sexually transmitted infections (STIs)  Get screened for STIs, including gonorrhea and chlamydia, if: ? You are sexually active and are younger than 31 years of age. ? You are older than 31 years of age and your health care provider tells you that you are at risk for this  type of infection. ? Your sexual activity has changed since you were last screened, and you are at increased risk for chlamydia or gonorrhea. Ask your health care provider if you are at risk.  Ask your health care provider about whether you are at high risk for HIV. Your health care provider may recommend a prescription medicine to help prevent HIV infection. If you choose to take medicine to prevent HIV, you should first get tested for HIV. You should then be tested every 3  months for as long as you are taking the medicine. Pregnancy  If you are about to stop having your period (premenopausal) and you may become pregnant, seek counseling before you get pregnant.  Take 400 to 800 micrograms (mcg) of folic acid every day if you become pregnant.  Ask for birth control (contraception) if you want to prevent pregnancy. Osteoporosis and menopause Osteoporosis is a disease in which the bones lose minerals and strength with aging. This can result in bone fractures. If you are 70 years old or older, or if you are at risk for osteoporosis and fractures, ask your health care provider if you should:  Be screened for bone loss.  Take a calcium or vitamin D supplement to lower your risk of fractures.  Be given hormone replacement therapy (HRT) to treat symptoms of menopause. Follow these instructions at home: Lifestyle  Do not use any products that contain nicotine or tobacco, such as cigarettes, e-cigarettes, and chewing tobacco. If you need help quitting, ask your health care provider.  Do not use street drugs.  Do not share needles.  Ask your health care provider for help if you need support or information about quitting drugs. Alcohol use  Do not drink alcohol if: ? Your health care provider tells you not to drink. ? You are pregnant, may be pregnant, or are planning to become pregnant.  If you drink alcohol: ? Limit how much you use to 0-1 drink a day. ? Limit intake if you are breastfeeding.  Be aware of how much alcohol is in your drink. In the [place] one drink equals one 12 oz bottle of beer (355 mL), one 5 oz glass of wine (148 mL), or one 1 oz glass of hard liquor (44 mL). General instructions  Schedule regular health, dental, and eye exams.  Stay current with your vaccines.  Tell your health care provider if: ? You often feel depressed. ? You have ever been abused or do not feel safe at home. Summary  Adopting a healthy lifestyle and getting  preventive care are important in promoting health and wellness.  Follow your health care provider's instructions about healthy diet, exercising, and getting tested or screened for diseases.  Follow your health care provider's instructions on monitoring your cholesterol and blood pressure. This information is not intended to replace advice given to you by your health care provider. Make sure you discuss any questions you have with your health care provider. Document Revised: 12/31/2017 Document Reviewed: 12/31/2017 Elsevier Patient Education  2020 [employer].

## 2019-04-02 ENCOUNTER — Encounter: Payer: Self-pay | Admitting: Internal Medicine

## 2019-04-02 ENCOUNTER — Other Ambulatory Visit: Payer: Self-pay

## 2019-04-02 ENCOUNTER — Ambulatory Visit (INDEPENDENT_AMBULATORY_CARE_PROVIDER_SITE_OTHER): Payer: 59 | Admitting: Internal Medicine

## 2019-04-02 VITALS — BP 122/70 | HR 79 | Temp 98.4°F | Resp 16 | Ht 65.0 in | Wt 184.0 lb

## 2019-04-02 DIAGNOSIS — F32A Depression, unspecified: Secondary | ICD-10-CM

## 2019-04-02 DIAGNOSIS — F419 Anxiety disorder, unspecified: Secondary | ICD-10-CM

## 2019-04-02 DIAGNOSIS — F329 Major depressive disorder, single episode, unspecified: Secondary | ICD-10-CM | POA: Diagnosis not present

## 2019-04-02 DIAGNOSIS — L709 Acne, unspecified: Secondary | ICD-10-CM | POA: Insufficient documentation

## 2019-04-02 DIAGNOSIS — Z0000 Encounter for general adult medical examination without abnormal findings: Secondary | ICD-10-CM

## 2019-04-02 DIAGNOSIS — L700 Acne vulgaris: Secondary | ICD-10-CM | POA: Diagnosis not present

## 2019-04-02 MED ORDER — ADAPALENE 0.3 % EX GEL: 1.0000 "application " | Freq: Every day | CUTANEOUS | 5 refills | Status: AC

## 2019-04-02 NOTE — Assessment & Plan Note (Signed)
Chronic Controlled, stable Continue current dose of medications - buspar 5 mg BID-TID, sertraline 50 mg daily, Wellbutrin 150 mg daily

## 2019-04-02 NOTE — Assessment & Plan Note (Signed)
Continue current products at home Trial of Differin gel

## 2019-04-21 ENCOUNTER — Other Ambulatory Visit: Payer: Self-pay | Admitting: Internal Medicine

## 2019-04-21 MED ORDER — MINOCYCLINE HCL 100 MG PO TABS: 100.0000 mg | ORAL_TABLET | Freq: Every day | ORAL | 0 refills | Status: DC

## 2019-06-11 ENCOUNTER — Encounter: Payer: Self-pay | Admitting: Internal Medicine

## 2019-06-15 ENCOUNTER — Encounter: Payer: Self-pay | Admitting: Family Medicine

## 2019-06-15 ENCOUNTER — Ambulatory Visit (INDEPENDENT_AMBULATORY_CARE_PROVIDER_SITE_OTHER): Payer: 59 | Admitting: Family Medicine

## 2019-06-15 VITALS — Ht 65.0 in

## 2019-06-15 DIAGNOSIS — M545 Low back pain, unspecified: Secondary | ICD-10-CM

## 2019-06-15 DIAGNOSIS — M999 Biomechanical lesion, unspecified: Secondary | ICD-10-CM | POA: Diagnosis not present

## 2019-06-15 MED ORDER — TIZANIDINE HCL 4 MG PO TABS: 4.0000 mg | ORAL_TABLET | Freq: Four times a day (QID) | ORAL | 1 refills | Status: DC | PRN

## 2019-06-15 MED ORDER — MELOXICAM 7.5 MG PO TABS: 7.5000 mg | ORAL_TABLET | Freq: Every day | ORAL | 0 refills | Status: DC

## 2019-06-15 NOTE — Assessment & Plan Note (Signed)
Chronic problem with mild intermittent pain.  Discussed meloxicam and Zanaflex.  Discussed icing regimen and home exercises.  Patient will make these changes and follow-up with me again in 3 to 4 weeks.

## 2019-06-15 NOTE — Assessment & Plan Note (Signed)

## 2019-06-15 NOTE — Progress Notes (Signed)
[place] Phone: [phone] Subjective:    I'm seeing this patient by the request  of:  Binnie Rail, MD  CC: Low back pain  [id number]  Diana King is a 29 y.o. female coming in with complaint of low back pain.  Has had this from time to time.  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  Feels like this is exacerbated it.  Patient has responded well to manipulation before and was wondering if this will be helpful.  Did try anti-inflammatories with mild improvement No numbness or any radiation down the leg.    Past Medical History:  Diagnosis Date  . GERD (gastroesophageal reflux disease)    Past Surgical History:  Procedure Laterality Date  . NO PAST SURGERIES     Social History   Socioeconomic History  . Marital status: Married    Spouse name: Rachel Bo  . Number of children: 1  . Years of education: Not on file  . Highest education level: Not on file  Occupational History  . Not on file  Tobacco Use  . Smoking status: Never Smoker  . Smokeless tobacco: Never Used  Substance and Sexual Activity  . Alcohol use: Yes    Comment: social  . Drug use: Not on file  . Sexual activity: Not on file  Other Topics Concern  . Not on file  Social History Narrative  . Not on file   Social Determinants of Health   Financial Resource Strain:   . Difficulty of Paying Living Expenses:   Food Insecurity:   . Worried About Charity fundraiser in the Last Year:   . Arboriculturist in the Last Year:   Transportation Needs:   . Film/video editor (Medical):   Marland Kitchen Lack of Transportation (Non-Medical):   Physical Activity:   . Days of Exercise per Week:   . Minutes of Exercise per Session:   Stress:   . Feeling of Stress :   Social Connections:   . Frequency of Communication with Friends and Family:   . Frequency of Social Gatherings with Friends and Family:   . Attends Religious  Services:   . Active Member of Clubs or Organizations:   . Attends Archivist Meetings:   Marland Kitchen Marital Status:    Allergies  Allergen Reactions  . Latex Swelling   Family History  Problem Relation Age of Onset  . Fibromyalgia Mother   . Interstitial cystitis Mother   . Migraines Mother   . Alcohol abuse Maternal Aunt   . Mental illness Maternal Aunt   . Alcohol abuse Maternal Uncle   . Thyroid disease Maternal Grandmother     Current Outpatient Medications (Endocrine & Metabolic):  .  drospirenone-ethinyl estradiol (YAZ,GIANVI,LORYNA) 3-0.02 MG tablet, Take 1 tablet by mouth daily.    Current Outpatient Medications (Analgesics):  .  meloxicam (MOBIC) 7.5 MG tablet, Take 1 tablet (7.5 mg total) by mouth daily.   Current Outpatient Medications (Other):  Marland Kitchen  Adapalene 0.3 % gel, Apply 1 application topically at bedtime. Marland Kitchen  buPROPion (WELLBUTRIN XL) 150 MG 24 hr tablet, TAKE 1 TABLET BY MOUTH EVERY DAY .  busPIRone (BUSPAR) 5 MG tablet, TAKE 1 TABLET BY MOUTH THREE TIMES A DAY .  minocycline (DYNACIN) 100 MG tablet, Take 1 tablet (100 mg total) by mouth daily. .  sertraline (ZOLOFT) 50 MG tablet, TAKE 1 TABLET BY MOUTH  EVERYDAY AT BEDTIME .  tiZANidine (ZANAFLEX) 4 MG tablet, Take 1 tablet (4 mg total) by mouth every 6 (six) hours as needed for muscle spasms.   Reviewed prior external information including notes and imaging from  primary care provider As well as notes that were available from care everywhere and other healthcare systems.  Past medical history, social, surgical and family history all reviewed in electronic medical record.  No pertanent information unless stated regarding to the chief complaint.   Review of Systems:  No headache, visual changes, nausea, vomiting, diarrhea, constipation, dizziness, abdominal pain, skin rash, fevers, chills, night sweats, weight loss, swollen lymph nodes, body aches, joint swelling, chest pain, shortness of breath, mood  changes. POSITIVE muscle aches  Objective  Height 5\' 5"  (1.651 m).   General: No apparent distress alert and oriented x3 mood and affect normal, dressed appropriately.  HEENT: Pupils equal, extraocular movements intact  Respiratory: Patient's speak in full sentences and does not appear short of breath  Cardiovascular: No lower extremity edema, non tender, no erythema  Neuro: Cranial nerves II through XII are intact, neurovascularly intact in all extremities with 2+ DTRs and 2+ pulses.  Gait normal with good balance and coordination.  MSK:  Non tender with full range of motion and good stability and symmetric strength and tone of shoulders, elbows, wrist, hip, knee and ankles bilaterally.  Back exam mild loss of lordosis.  Some mild tightness noted of the hip flexors bilaterally right greater than left.  Patient does lack the last 5 degrees of extension.  Patient does have mild tightness with but no severe pain.  No radicular symptoms.  5 out of 5 strength otherwise.  Osteopathic findings L3 flexed rotated and side bent right Sacrum left on left   Impression and Recommendations:      The above documentation has been reviewed and is accurate and complete Pearlean Brownie, DO       Note: This dictation was prepared with Dragon dictation along with smaller phrase technology. Any transcriptional errors that result from this process are unintentional.

## 2019-07-01 ENCOUNTER — Encounter: Payer: Self-pay | Admitting: Internal Medicine

## 2019-07-01 MED ORDER — BUPROPION HCL ER (XL) 150 MG PO TB24: 150.0000 mg | ORAL_TABLET | Freq: Every day | ORAL | 1 refills | Status: DC

## 2019-07-01 MED ORDER — BUSPIRONE HCL 5 MG PO TABS: 5.0000 mg | ORAL_TABLET | Freq: Three times a day (TID) | ORAL | 1 refills | Status: DC

## 2019-07-02 ENCOUNTER — Other Ambulatory Visit: Payer: Self-pay | Admitting: Internal Medicine

## 2019-07-08 ENCOUNTER — Other Ambulatory Visit: Payer: Self-pay | Admitting: Family Medicine

## 2019-07-15 ENCOUNTER — Other Ambulatory Visit: Payer: Self-pay | Admitting: Internal Medicine

## 2019-09-15 ENCOUNTER — Encounter: Payer: Self-pay | Admitting: Internal Medicine

## 2019-09-15 DIAGNOSIS — L709 Acne, unspecified: Secondary | ICD-10-CM

## 2019-11-11 ENCOUNTER — Encounter: Payer: Self-pay | Admitting: Internal Medicine

## 2019-11-11 DIAGNOSIS — E6609 Other obesity due to excess calories: Secondary | ICD-10-CM

## 2019-11-12 ENCOUNTER — Telehealth: Payer: Self-pay | Admitting: Family Medicine

## 2019-11-12 MED ORDER — TIZANIDINE HCL 4 MG PO TABS: 4.0000 mg | ORAL_TABLET | Freq: Four times a day (QID) | ORAL | 1 refills | Status: DC | PRN

## 2019-11-12 NOTE — Telephone Encounter (Signed)
Requested refill of tizanidine for current mild back spasm.

## 2019-11-17 ENCOUNTER — Encounter: Payer: Self-pay | Admitting: Family Medicine

## 2019-11-17 ENCOUNTER — Ambulatory Visit (INDEPENDENT_AMBULATORY_CARE_PROVIDER_SITE_OTHER): Payer: 59 | Admitting: Family Medicine

## 2019-11-17 DIAGNOSIS — M545 Low back pain, unspecified: Secondary | ICD-10-CM

## 2019-11-17 DIAGNOSIS — M999 Biomechanical lesion, unspecified: Secondary | ICD-10-CM

## 2019-11-17 MED ORDER — MELOXICAM 7.5 MG PO TABS: 7.5000 mg | ORAL_TABLET | Freq: Every day | ORAL | 0 refills | Status: DC

## 2019-11-17 NOTE — Assessment & Plan Note (Addendum)
Mild increase in back pain.  Seems to be more in the thoracolumbar junction.  Patient does have tightness there.  Discussed with patient.  Discussed the muscle relaxer that was given by another provider.  Meloxicam prescribed today using 3 to 5-day burst.  Patient is on spironolactone and will try to avoid chronic use.  Discussed posture and ergonomics, home exercises encouraged for the thoracolumbar junction and hip flexors.  Follow-up again in 4 to 8 weeks  Patient did recently also start a new medication spironolactone in 2 months.  Could be contributing as well.

## 2019-11-17 NOTE — Progress Notes (Signed)
[name] [address] [place] [address] Phone: [phone] Subjective:    I'm seeing this patient by the request  of:  Pincus Sanes, MD  CC: Low back pain follow-up  UPJ:[id number]  Diana King is a 29 y.o. female coming in with complaint of back and neck pain. OMT 06/15/2019. Patient states  Has had increasing recently.  He describes the pain as a dull, throbbing aching pain more in the thoracolumbar juncture.  Patient states worse with sitting or laying down.  No radiation down the legs.  No numbness.  Patient states that it is just more of a tightness.  Given a muscle relaxer by another provider with mild improvement.  Has not taken the meloxicam secondary to being on spironolactone recently for her acne.         Reviewed prior external information including notes and imaging from previsou exam, outside providers and external EMR if available.   As well as notes that were available from care everywhere and other healthcare systems.  Past medical history, social, surgical and family history all reviewed in electronic medical record.  No pertanent information unless stated regarding to the chief complaint.   Past Medical History:  Diagnosis Date  . GERD (gastroesophageal reflux disease)     Allergies  Allergen Reactions  . Latex Swelling     Review of Systems:  No headache, visual changes, nausea, vomiting, diarrhea, constipation, dizziness, abdominal pain, skin rash, fevers, chills, night sweats, weight loss, swollen lymph nodes, body aches, joint swelling, chest pain, shortness of breath, mood changes. POSITIVE muscle aches  Objective    General: No apparent distress alert and oriented x3 mood and affect normal, dressed appropriately.  HEENT: Pupils equal, extraocular movements intact  Respiratory: Patient's speak in full sentences and does not appear short of breath  Cardiovascular: No lower extremity edema, non tender,  no erythema  Neuro: Cranial nerves II through XII are intact, neurovascularly intact in all extremities with 2+ DTRs and 2+ pulses.  Gait normal with good balance and coordination.  MSK:  Non tender with full range of motion and good stability and symmetric strength and tone of shoulders, elbows, wrist, hip, knee and ankles bilaterally.  Back -low back exam shows the patient has some tenderness to palpation in the paraspinal musculature lumbar spine right greater than left.  Negative straight leg test.  Patient is some tightness noted more in the thoracolumbar junction right greater than left as well.  Mild tightness like in the last 5 degrees of extension of back.  No radicular symptoms though.  5/5 strength in lower extremities  Osteopathic findings  C2 flexed rotated and side bent right C6 flexed rotated and side bent left T3 extended rotated and side bent right inhaled rib T11 extended rotated and side bent left L 1 flexed rotated and side bent right Sacrum right on right       Assessment and Plan:   Low back pain Mild increase in back pain.  Seems to be more in the thoracolumbar junction.  Patient does have tightness there.  Discussed with patient.  Discussed the muscle relaxer that was given by another provider.  Meloxicam prescribed today using 3 to 5-day burst.  Patient is on spironolactone and will try to avoid chronic use.  Discussed posture and ergonomics, home exercises encouraged for the thoracolumbar junction and hip flexors.  Follow-up again in 4 to 8 weeks  Patient did recently also start a new medication  spironolactone in 2 months.  Could be contributing as well.    Nonallopathic problems  Decision today to treat with OMT was based on Physical Exam  After verbal consent patient was treated with HVLA, ME, FPR techniques in  thoracic, lumbar, and sacral  areas  Patient tolerated the procedure well with improvement in symptoms  Patient given exercises, stretches and  lifestyle modifications  See medications in patient instructions if given  Patient will follow up in 4 weeks      The above documentation has been reviewed and is accurate and complete Judi Saa, DO       Note: This dictation was prepared with Dragon dictation along with smaller phrase technology. Any transcriptional errors that result from this process are unintentional.

## 2019-11-25 ENCOUNTER — Other Ambulatory Visit: Payer: Self-pay

## 2019-11-25 ENCOUNTER — Ambulatory Visit (INDEPENDENT_AMBULATORY_CARE_PROVIDER_SITE_OTHER): Payer: 59 | Admitting: Family Medicine

## 2019-11-25 ENCOUNTER — Encounter: Payer: Self-pay | Admitting: Family Medicine

## 2019-11-25 DIAGNOSIS — M7662 Achilles tendinitis, left leg: Secondary | ICD-10-CM | POA: Diagnosis not present

## 2019-11-25 MED ORDER — NITROGLYCERIN 0.2 MG/HR TD PT24: MEDICATED_PATCH | TRANSDERMAL | 0 refills | Status: DC

## 2019-11-25 NOTE — Patient Instructions (Signed)
Partial achilles tear Boot for 2 weeks Nitroglycerin Protocol   Apply 1/4 nitroglycerin patch to affected area daily.  Change position of patch within the affected area every 24 hours.  You may experience a headache during the first 1-2 weeks of using the patch, these should subside.  If you experience headaches after beginning nitroglycerin patch treatment, you may take your preferred over the counter pain reliever.  Another side effect of the nitroglycerin patch is skin irritation or rash related to patch adhesive.  Please notify our office if you develop more severe headaches or rash, and stop the patch.  Tendon healing with nitroglycerin patch may require 12 to 24 weeks depending on the extent of injury.  Men should not use if taking Viagra, Cialis, or Levitra.   Do not use if you have migraines or rosacea. Ice 20 min 2 x a day HOKA recovery sandals in the house I'll see you around

## 2019-11-25 NOTE — Progress Notes (Signed)
[name] [address] [place] [address] Phone: [phone] Subjective:    I'm seeing this patient by the request  of:  Pincus Sanes, MD  CC: Left ankle pain  QMG:[id number]  Diana King is a 29 y.o. female coming in with complaint of left ankle pain. Patient has recently had an injury. Does not remember exactly where she did. And did not take a step may be on the beach in a weird way. Has slowly been getting worse over the course the last week. Describes the pain as a dull, throbbing aching sensation.      Past Medical History:  Diagnosis Date  . GERD (gastroesophageal reflux disease)    Past Surgical History:  Procedure Laterality Date  . NO PAST SURGERIES     Social History   Socioeconomic History  . Marital status: Married    Spouse name: Amalia Hailey  . Number of children: 1  . Years of education: Not on file  . Highest education level: Not on file  Occupational History  . Not on file  Tobacco Use  . Smoking status: Never Smoker  . Smokeless tobacco: Never Used  Substance and Sexual Activity  . Alcohol use: Yes    Comment: social  . Drug use: Not on file  . Sexual activity: Not on file  Other Topics Concern  . Not on file  Social History Narrative  . Not on file   Social Determinants of Health   Financial Resource Strain:   . Difficulty of Paying Living Expenses: Not on file  Food Insecurity:   . Worried About Programme researcher, broadcasting/film/video in the Last Year: Not on file  . Ran Out of Food in the Last Year: Not on file  Transportation Needs:   . Lack of Transportation (Medical): Not on file  . Lack of Transportation (Non-Medical): Not on file  Physical Activity:   . Days of Exercise per Week: Not on file  . Minutes of Exercise per Session: Not on file  Stress:   . Feeling of Stress : Not on file  Social Connections:   . Frequency of Communication with Friends and Family: Not on file  . Frequency of Social Gatherings  with Friends and Family: Not on file  . Attends Religious Services: Not on file  . Active Member of Clubs or Organizations: Not on file  . Attends Banker Meetings: Not on file  . Marital Status: Not on file   Allergies  Allergen Reactions  . Latex Swelling   Family History  Problem Relation Age of Onset  . Fibromyalgia Mother   . Interstitial cystitis Mother   . Migraines Mother   . Alcohol abuse Maternal Aunt   . Mental illness Maternal Aunt   . Alcohol abuse Maternal Uncle   . Thyroid disease Maternal Grandmother     Current Outpatient Medications (Endocrine & Metabolic):  .  drospirenone-ethinyl estradiol (YAZ,GIANVI,LORYNA) 3-0.02 MG tablet, Take 1 tablet by mouth daily.    Current Outpatient Medications (Analgesics):  .  meloxicam (MOBIC) 7.5 MG tablet, Take 1 tablet (7.5 mg total) by mouth daily.   Current Outpatient Medications (Other):  Marland Kitchen  Adapalene 0.3 % gel, Apply 1 application topically at bedtime. Marland Kitchen  buPROPion (WELLBUTRIN XL) 150 MG 24 hr tablet, Take 1 tablet (150 mg total) by mouth daily. .  busPIRone (BUSPAR) 5 MG tablet, Take 1 tablet (5 mg total) by mouth 3 (three) times daily. .  minocycline (  DYNACIN) 100 MG tablet, TAKE 1 TABLET BY MOUTH EVERY DAY .  sertraline (ZOLOFT) 50 MG tablet, TAKE 1 TABLET BY MOUTH EVERYDAY AT BEDTIME .  tiZANidine (ZANAFLEX) 4 MG tablet, Take 1 tablet (4 mg total) by mouth every 6 (six) hours as needed for muscle spasms.   Reviewed prior external information including notes and imaging from  primary care provider As well as notes that were available from care everywhere and other healthcare systems.  Past medical history, social, surgical and family history all reviewed in electronic medical record.  No pertanent information unless stated regarding to the chief complaint.   Review of Systems:  No headache, visual changes, nausea, vomiting, diarrhea, constipation, dizziness, abdominal pain, skin rash, fevers,  chills, night sweats, weight loss, swollen lymph nodes, body aches, joint swelling, chest pain, shortness of breath, mood changes. POSITIVE muscle aches  Objective  Height 5\' 5"  (1.651 m), weight 184 lb (83.5 kg).   General: No apparent distress alert and oriented x3 mood and affect normal, dressed appropriately.  HEENT: Pupils equal, extraocular movements intact  Respiratory: Patient's speak in full sentences and does not appear short of breath  Cardiovascular: No lower extremity edema, non tender, no erythema  Severely antalgic gait MSK: Left ankle shows the patient is severely tender over the Achilles at its insertion. Patient lacks the last 5 degrees of dorsiflexion. Patient's calf is intact. Neurovascularly intact distally.  Limited musculoskeletal ultrasound was performed and interpreted by  Limited ultrasound of patient's Achilles area does show some increasing Doppler flow right at the insertion on the calcaneal area. Seems to be superficial. Consistent with a intersubstance tear noted. No cortical irregularity noted. Impression: Small likely intrasubstance tear of the Achilles at the insertion.    Impression and Recommendations:     The above documentation has been reviewed and is accurate and complete Judi Saa, DO

## 2019-11-25 NOTE — Assessment & Plan Note (Signed)
Severe with likely intrasubstance tearing noted. Was started on nitroglycerin and warned of potential side effects. Discussed icing regimen. CAM Walker. Meloxicam daily for the next 5 days. Patient will see me again in 2 to 3 weeks.

## 2019-12-31 ENCOUNTER — Other Ambulatory Visit: Payer: Self-pay | Admitting: Internal Medicine

## 2020-02-09 ENCOUNTER — Other Ambulatory Visit: Payer: Self-pay

## 2020-02-09 ENCOUNTER — Encounter (INDEPENDENT_AMBULATORY_CARE_PROVIDER_SITE_OTHER): Payer: Self-pay | Admitting: Family Medicine

## 2020-02-09 ENCOUNTER — Ambulatory Visit (INDEPENDENT_AMBULATORY_CARE_PROVIDER_SITE_OTHER): Payer: 59 | Admitting: Family Medicine

## 2020-02-09 VITALS — BP 106/75 | HR 83 | Temp 97.7°F | Ht 66.0 in | Wt 207.0 lb

## 2020-02-09 DIAGNOSIS — F419 Anxiety disorder, unspecified: Secondary | ICD-10-CM

## 2020-02-09 DIAGNOSIS — Z9189 Other specified personal risk factors, not elsewhere classified: Secondary | ICD-10-CM | POA: Diagnosis not present

## 2020-02-09 DIAGNOSIS — E66811 Obesity, class 1: Secondary | ICD-10-CM

## 2020-02-09 DIAGNOSIS — Z0289 Encounter for other administrative examinations: Secondary | ICD-10-CM

## 2020-02-09 DIAGNOSIS — F32A Depression, unspecified: Secondary | ICD-10-CM | POA: Diagnosis not present

## 2020-02-09 DIAGNOSIS — R632 Polyphagia: Secondary | ICD-10-CM

## 2020-02-09 DIAGNOSIS — R5383 Other fatigue: Secondary | ICD-10-CM | POA: Diagnosis not present

## 2020-02-09 DIAGNOSIS — E669 Obesity, unspecified: Secondary | ICD-10-CM

## 2020-02-09 DIAGNOSIS — E559 Vitamin D deficiency, unspecified: Secondary | ICD-10-CM | POA: Diagnosis not present

## 2020-02-09 DIAGNOSIS — R0602 Shortness of breath: Secondary | ICD-10-CM

## 2020-02-09 DIAGNOSIS — Z6833 Body mass index (BMI) 33.0-33.9, adult: Secondary | ICD-10-CM

## 2020-02-10 ENCOUNTER — Encounter (INDEPENDENT_AMBULATORY_CARE_PROVIDER_SITE_OTHER): Payer: Self-pay | Admitting: Family Medicine

## 2020-02-10 LAB — T3: T3, Total: 145 ng/dL (ref 71–180)

## 2020-02-10 LAB — COMPREHENSIVE METABOLIC PANEL
ALT: 13 IU/L (ref 0–32)
AST: 11 IU/L (ref 0–40)
Albumin/Globulin Ratio: 1.3 (ref 1.2–2.2)
Albumin: 4.2 g/dL (ref 3.8–4.8)
Alkaline Phosphatase: 54 IU/L (ref 44–121)
BUN/Creatinine Ratio: 14 (ref 9–23)
BUN: 11 mg/dL (ref 6–20)
Bilirubin Total: <0.2 mg/dL (ref 0.0–1.2)
CO2: 20 mmol/L (ref 20–29)
Calcium: 9.2 mg/dL (ref 8.7–10.2)
Chloride: 102 mmol/L (ref 96–106)
Creatinine, Ser: 0.79 mg/dL (ref 0.57–1.00)
GFR calc Af Amer: 115 mL/min/{1.73_m2} (ref >59)
GFR calc non Af Amer: 100 mL/min/{1.73_m2} (ref >59)
Globulin, Total: 3.2 g/dL (ref 1.5–4.5)
Glucose: 89 mg/dL (ref 65–99)
Potassium: 4.7 mmol/L (ref 3.5–5.2)
Sodium: 139 mmol/L (ref 134–144)
Total Protein: 7.4 g/dL (ref 6.0–8.5)

## 2020-02-10 LAB — CBC WITH DIFFERENTIAL/PLATELET
Basophils Absolute: 0.1 10*3/uL (ref 0.0–0.2)
Basos: 1 %
EOS (ABSOLUTE): 0.1 10*3/uL (ref 0.0–0.4)
Eos: 1 %
Hematocrit: 40.6 % (ref 34.0–46.6)
Hemoglobin: 13.2 g/dL (ref 11.1–15.9)
Immature Grans (Abs): 0 10*3/uL (ref 0.0–0.1)
Immature Granulocytes: 0 %
Lymphocytes Absolute: 2.9 10*3/uL (ref 0.7–3.1)
Lymphs: 30 %
MCH: 27.4 pg (ref 26.6–33.0)
MCHC: 32.5 g/dL (ref 31.5–35.7)
MCV: 84 fL (ref 79–97)
Monocytes Absolute: 0.7 10*3/uL (ref 0.1–0.9)
Monocytes: 7 %
Neutrophils Absolute: 6 10*3/uL (ref 1.4–7.0)
Neutrophils: 61 %
Platelets: 428 10*3/uL (ref 150–450)
RBC: 4.82 x10E6/uL (ref 3.77–5.28)
RDW: 12.8 % (ref 11.7–15.4)
WBC: 9.9 10*3/uL (ref 3.4–10.8)

## 2020-02-10 LAB — LIPID PANEL WITH LDL/HDL RATIO
Cholesterol, Total: 206 mg/dL — ABNORMAL HIGH (ref 100–199)
HDL: 47 mg/dL (ref >39)
LDL Chol Calc (NIH): 123 mg/dL — ABNORMAL HIGH (ref 0–99)
LDL/HDL Ratio: 2.6 ratio (ref 0.0–3.2)
Triglycerides: 203 mg/dL — ABNORMAL HIGH (ref 0–149)
VLDL Cholesterol Cal: 36 mg/dL (ref 5–40)

## 2020-02-10 LAB — HEMOGLOBIN A1C
Est. average glucose Bld gHb Est-mCnc: 111 mg/dL
Hgb A1c MFr Bld: 5.5 % (ref 4.8–5.6)

## 2020-02-10 LAB — INSULIN, RANDOM: INSULIN: 13.5 u[IU]/mL (ref 2.6–24.9)

## 2020-02-10 LAB — TSH: TSH: 2.33 u[IU]/mL (ref 0.450–4.500)

## 2020-02-10 LAB — VITAMIN B12: Vitamin B-12: 619 pg/mL (ref 232–1245)

## 2020-02-10 LAB — VITAMIN D 25 HYDROXY (VIT D DEFICIENCY, FRACTURES): Vit D, 25-Hydroxy: 25.5 ng/mL — ABNORMAL LOW (ref 30.0–100.0)

## 2020-02-10 LAB — T4: T4, Total: 7.8 ug/dL (ref 4.5–12.0)

## 2020-02-10 LAB — FOLATE: Folate: 14 ng/mL (ref >3.0)

## 2020-02-14 NOTE — Progress Notes (Signed)
Chief Complaint:   OBESITY Diana King (MR# [id number]) is a 30 y.o. female who presents for evaluation and treatment of obesity and related comorbidities. Current BMI is Body mass index is 33.41 kg/m. Diana King has been struggling with her weight for many years and has been unsuccessful in either losing weight, maintaining weight loss, or reaching her healthy weight goal.  Diana King heard about clinic from [facility] ([facility]). Weight gain of 30 lbs past year. Occasionally skipping breakfast. For breakfast, she will have Premier protein Shake and then granola (1/2 cup) and almond milk. (feel satisfied). For lunch, microwave meal with protein (1/2 cup) with sauteed veggies (1.5 cups), zucchini, squash onions, potatoes with apple. (feel satisfied). At 3 pm, a Premier shake for snack. For dinner, Arbys [place]iturkey wrap and curly fries, or [employer] 2 Mcdoubles, medium fry and frappe (feel full). After dinner, a bag of chips, cereal, goldfish, ice cream, or soup.  Diana King is currently in the action stage of change and ready to dedicate time achieving and maintaining a healthier weight. Diana King is interested in becoming our patient and working on intensive lifestyle modifications including (but not limited to) diet and exercise for weight loss.  Diana King's habits were reviewed today and are as follows: Her family eats meals together, she thinks her family will eat healthier with her, she struggles with family and or coworkers weight loss sabotage, her desired weight loss is 59 pounds, she started gaining weight after giving birth, her heaviest weight ever was 210  pounds, she is a picky eater and doesn't like to eat healthier foods, she has significant food cravings issues, she snacks frequently in the evenings, she skips meals frequently, she is frequently drinking liquids with calories, she frequently makes poor food choices, she has problems with excessive hunger, she frequently eats larger portions than normal,  she has binge eating behaviors and she struggles with emotional eating.  Depression Screen Diana King (modified PHQ-9) score was 17.  Depression screen PHQ 2/9 02/09/2020  Decreased Interest 3  Down, Depressed, Hopeless 3  PHQ - 2 Score 6  Altered sleeping 2  Tired, decreased energy 3  Change in appetite 3  Feeling bad or failure about yourself  3  Trouble concentrating 0  Moving slowly or fidgety/restless 0  Suicidal thoughts 0  PHQ-9 Score 17  Difficult doing work/chores Not difficult at all   Subjective:   1. Other fatigue  Maurie admits to daytime somnolence and admits to waking up still tired. Patent has a history of symptoms of daytime fatigue and morning fatigue. Diana King generally gets 9 hours of sleep per night, and states that she has difficulty falling asleep. Snoring is present. Apneic episodes is not present. Epworth Sleepiness Score is 9.  EKG-1 PVC otherwise NSR at 82 bpm.  2. SOB (shortness of breath) on exertion Diana King notes increasing shortness of breath with exercising and seems to be worsening over time with weight gain. She notes getting out of breath sooner with activity than she used to. This has gotten worse recently. Diana King denies shortness of breath at rest or orthopnea.  3. Vitamin D deficiency  Diana King is on Vitamin D supplement. She notes fatigue. Diagnosis likely given obesity.  4. Polyphagia Diana King will have 3 individual bags of chips or goldfish, a lunchable, 2 bowls of cereal, and 1 bag of mini muffins.  5.  Anxiety and depression Lorene's symptoms are well managed with Wellbutrin, Sertraline, and Buspar. Diana King denies suicidal or homicidal  ideations. Diana King denies any side effect of current medications.  6.  At risk for osteoporosis Felicita is at higher risk of osteopenia and osteoporosis due to Vitamin D deficiency.    Assessment/Plan:   1. Other fatigue Arvilla does feel that her weight is causing her energy to be lower than it  should be. Fatigue may be related to obesity, depression or many other causes. Labs will be ordered, and in the meanwhile, Andreana will focus on self care including making healthy food choices, increasing physical activity and focusing on stress reduction.  - EKG 12-Lead - Vitamin B12 - Comprehensive metabolic panel - Folate - Hemoglobin A1c - Insulin, random - T3 - T4 - TSH  2. SOB (shortness of breath) on exertion Debie does feel that she gets out of breath more easily that she used to when she exercises. Reya's shortness of breath appears to be obesity related and exercise induced. She has agreed to work on weight loss and gradually increase exercise to treat her exercise induced shortness of breath. Will continue to monitor closely. - CBC with Differential/Platelet - Lipid Panel With LDL/HDL Ratio  3. Vitamin D deficiency Low Vitamin D level contributes to fatigue and are associated with obesity, breast, and colon cancer. She agrees to continue to take prescription Vitamin D @50 ,000 IU every week and will follow-up for routine testing of Vitamin D, at least 2-3 times per year to avoid over-replacement.  - VITAMIN D 25 Hydroxy (Vit-D Deficiency, Fractures)  4. Polyphagia Intensive lifestyle modifications are the first line treatment for this issue. We discussed several lifestyle modifications today and she will continue to work on diet, exercise and weight loss efforts. Orders and follow up as documented in patient record.  Counseling . Polyphagia is excessive hunger. . Causes can include: low blood sugars, hypERthyroidism, PMS, lack of sleep, stress, insulin resistance, diabetes, certain medications, and diets that are deficient in protein and fiber.   5. Anxiety and depression Behavior modification techniques were discussed today to help Diana King deal with her emotional/non-hunger eating behaviors.  Orders and follow up as documented in patient record. Diana King will continue current  medications. No change in medications. We will refer to Dr. Luanna King.  6. At risk for osteoporosis Diana King was given approximately 15 minutes of osteoporosis prevention counseling today. Diana King is at risk for osteopenia and osteoporosis due to her Vitamin D deficiency. She was encouraged to take her Vitamin D and follow her higher calcium diet and increase strengthening exercise to help strengthen her bones and decrease her risk of osteopenia and osteoporosis.  Repetitive spaced learning was employed today to elicit superior memory formation and behavioral change. 7. Class 1 obesity with serious comorbidity and body mass index (BMI) of 33.0 to 33.9 in adult, unspecified obesity type  Diana King is currently in the action stage of change and her goal is to continue with weight loss efforts. I recommend Diana King begin the structured treatment plan as follows:  She has agreed to the Category 4 Plan.  Exercise goals: No exercise has been prescribed at this time.   Behavioral modification strategies: increasing lean protein intake, meal planning and cooking strategies, keeping healthy foods in the home and planning for success.  She was informed of the importance of frequent follow-up visits to maximize her success with intensive lifestyle modifications for her multiple health conditions. She was informed we would discuss her lab results at her next visit unless there is a critical issue that needs to be addressed sooner. Diana King agreed  to keep her next visit at the agreed upon time to discuss these results.  Objective:   Blood pressure 106/75, pulse 83, temperature 97.7 F (36.5 C), temperature source Oral, height 5\' 6"  (1.676 m), weight 207 lb (93.9 kg), last menstrual period 01/31/2020, SpO2 96 %. Body mass index is 33.41 kg/m.  EKG: Normal sinus rhythm, rate 82 bpm.  Indirect Calorimeter completed today shows a VO2 of 301 and a REE of 2093.  Her calculated basal metabolic rate is 2094 thus her basal  metabolic rate is better than expected.  General: Cooperative, alert, well developed, in no acute distress. HEENT: Conjunctivae and lids unremarkable. Cardiovascular: Regular rhythm.  Lungs: Normal work of breathing. Neurologic: No focal deficits.   Lab Results  Component Value Date   CREATININE 0.79 02/09/2020   BUN 11 02/09/2020   NA 139 02/09/2020   K 4.7 02/09/2020   CL 102 02/09/2020   CO2 20 02/09/2020   Lab Results  Component Value Date   ALT 13 02/09/2020   AST 11 02/09/2020   ALKPHOS 54 02/09/2020   BILITOT <0.2 02/09/2020   Lab Results  Component Value Date   HGBA1C 5.5 02/09/2020   Lab Results  Component Value Date   INSULIN 13.5 02/09/2020   Lab Results  Component Value Date   TSH 2.330 02/09/2020   Lab Results  Component Value Date   CHOL 206 (H) 02/09/2020   HDL 47 02/09/2020   LDLCALC 123 (H) 02/09/2020   TRIG 203 (H) 02/09/2020   CHOLHDL 3 04/01/2019   Lab Results  Component Value Date   WBC 9.9 02/09/2020   HGB 13.2 02/09/2020   HCT 40.6 02/09/2020   MCV 84 02/09/2020   PLT 428 02/09/2020   No results found for: IRON, TIBC, FERRITIN   Attestation Statements:   Reviewed by clinician on day of visit: allergies, medications, problem list, medical history, surgical history, family history, social history, and previous encounter notes.  This is the patient's first visit at [facility]. The patient's NEW PATIENT PACKET was reviewed at length. Included in the packet: current and past health history, medications, allergies, ROS, gynecologic history (women only), surgical history, family history, social history, weight history, weight loss surgery history (for those that have had weight loss surgery), nutritional evaluation, King and food questionnaire, PHQ9, Epworth questionnaire, sleep habits questionnaire, patient life and health improvement goals questionnaire. These will all be scanned into the patient's chart under media.    During the visit, I independently reviewed the patient's EKG, bioimpedance scale results, and indirect calorimeter results. I used this information to tailor a meal plan for the patient that will help her to lose weight and will improve her obesity-related conditions going forward. I performed a medically necessary appropriate examination and/or evaluation. I discussed the assessment and treatment plan with the patient. The patient was provided an opportunity to ask questions and all were answered. The patient agreed with the plan and demonstrated an understanding of the instructions. Labs were ordered at this visit and will be reviewed at the next visit unless more critical results need to be addressed immediately. Clinical information was updated and documented in the EMR.   Time spent on visit including pre-visit chart review and post-visit care was 45 minutes.   A separate 15 minutes was spent on risk counseling (see above).   I, 02/11/2020, am acting as transcriptionist for Delorse Limber, MD.  I have reviewed the above documentation for accuracy and completeness, and I  agree with the above. - Katherina Mires, MD

## 2020-02-15 ENCOUNTER — Ambulatory Visit: Payer: 59 | Admitting: Family Medicine

## 2020-02-23 ENCOUNTER — Encounter (INDEPENDENT_AMBULATORY_CARE_PROVIDER_SITE_OTHER): Payer: Self-pay | Admitting: Family Medicine

## 2020-02-23 ENCOUNTER — Other Ambulatory Visit: Payer: Self-pay

## 2020-02-23 ENCOUNTER — Ambulatory Visit (INDEPENDENT_AMBULATORY_CARE_PROVIDER_SITE_OTHER): Payer: 59 | Admitting: Family Medicine

## 2020-02-23 VITALS — BP 114/75 | HR 77 | Temp 97.7°F | Ht 66.0 in | Wt 202.0 lb

## 2020-02-23 DIAGNOSIS — E8881 Metabolic syndrome: Secondary | ICD-10-CM | POA: Diagnosis not present

## 2020-02-23 DIAGNOSIS — Z6832 Body mass index (BMI) 32.0-32.9, adult: Secondary | ICD-10-CM

## 2020-02-23 DIAGNOSIS — E559 Vitamin D deficiency, unspecified: Secondary | ICD-10-CM | POA: Diagnosis not present

## 2020-02-23 DIAGNOSIS — Z9189 Other specified personal risk factors, not elsewhere classified: Secondary | ICD-10-CM | POA: Diagnosis not present

## 2020-02-23 DIAGNOSIS — E669 Obesity, unspecified: Secondary | ICD-10-CM

## 2020-02-23 DIAGNOSIS — E7849 Other hyperlipidemia: Secondary | ICD-10-CM

## 2020-02-23 MED ORDER — VITAMIN D (ERGOCALCIFEROL) 1.25 MG (50000 UNIT) PO CAPS: 50000.0000 [IU] | ORAL_CAPSULE | ORAL | 0 refills | Status: DC

## 2020-02-24 NOTE — Progress Notes (Signed)
Chief Complaint:   OBESITY Diana King is here to discuss her progress with her obesity treatment plan along with follow-up of her obesity related diagnoses. Diana King is on the Category 4 Plan and states she is following her eating plan approximately 0% of the time. Diana King states she is walking 6,000 steps and dancing 20 minutes 5 times per week.  Today's visit was #: 2 Starting weight: 207 lbs Starting date: 02/09/2020 Today's weight: 202 lbs Today's date: 02/23/2020 Total lbs lost to date: 5 lbs Total lbs lost since last in-office visit: 5 lbs  Interim History: Pt's husband's birthday was last week so she indulged in alcohol and indulgent food. Pt brings questions in today. She notes her scale did not work and pt tried to eyeball certain portions. Pt's birthday is coming up in the next few weeks.  Subjective:   1. Vitamin D deficiency Pt denies nausea, vomiting, and muscle weakness but notes fatigue. Pt is on prescription Vit D.  2. Other hyperlipidemia LDL of 123, HDL 47, and triglycerides 035. Pt is not on statin therapy.  3. Insulin resistance Pt's A1c 5.5 and insulin level 13.5. She reports occasional carb cravings but controlled indulgences.  4. At risk for osteoporosis Diana King is at higher risk of osteopenia and osteoporosis due to Vitamin D deficiency.   Assessment/Plan:   1. Vitamin D deficiency Low Vitamin D level contributes to fatigue and are associated with obesity, breast, and colon cancer. She agrees to continue to take prescription Vitamin D @50 ,000 IU every week and will follow-up for routine testing of Vitamin D, at least 2-3 times per year to avoid over-replacement.   - Vitamin D, Ergocalciferol, (DRISDOL) 1.25 MG (50000 UNIT) CAPS capsule; Take 1 capsule (50,000 Units total) by mouth every 7 (seven) days.  Dispense: 4 capsule; Refill: 0  2. Other hyperlipidemia Cardiovascular risk and specific lipid/LDL goals reviewed.  We discussed several lifestyle  modifications today and Diana King will continue to work on diet, exercise and weight loss efforts. Orders and follow up as documented in patient record. Repeat labs in 3 months.. Not at goal yet.  Counseling Intensive lifestyle modifications are the first line treatment for this issue. . Dietary changes: Increase soluble fiber. Decrease simple carbohydrates. . Exercise changes: Moderate to vigorous-intensity aerobic activity 150 minutes per week if tolerated. . Lipid-lowering medications: see documented in medical record.  3. Insulin resistance Diana King will continue to work on weight loss, exercise, and decreasing simple carbohydrates to help decrease the risk of diabetes. Diana King agreed to follow-up with Diana King as directed to closely monitor her progress. Repeat labs in 3 months.  4. At risk for osteoporosis Diana King was given approximately 15 minutes of osteoporosis prevention counseling today. Diana King is at risk for osteopenia and osteoporosis due to her Vitamin D deficiency. She was encouraged to take her Vitamin D and follow her higher calcium diet and increase strengthening exercise to help strengthen her bones and decrease her risk of osteopenia and osteoporosis.  Repetitive spaced learning was employed today to elicit superior memory formation and behavioral change.  5. Class 1 obesity with serious comorbidity and body mass index (BMI) of 32.0 to 32.9 in adult, unspecified obesity type Diana King is currently in the action stage of change. As such, her goal is to continue with weight loss efforts. She has agreed to the Category 4 Plan.   Exercise goals: No exercise has been prescribed at this time.  Behavioral modification strategies: increasing lean protein intake, meal planning and cooking  strategies, keeping healthy foods in the home and planning for success.  Diana King has agreed to follow-up with our clinic in 2 weeks. She was informed of the importance of frequent follow-up visits to maximize her  success with intensive lifestyle modifications for her multiple health conditions.   Objective:   Blood pressure 114/75, pulse 77, temperature 97.7 F (36.5 C), temperature source Oral, height 5\' 6"  (1.676 m), weight 202 lb (91.6 kg), last menstrual period 01/31/2020, SpO2 97 %. Body mass index is 32.6 kg/m.  General: Cooperative, alert, well developed, in no acute distress. HEENT: Conjunctivae and lids unremarkable. Cardiovascular: Regular rhythm.  Lungs: Normal work of breathing. Neurologic: No focal deficits.   Lab Results  Component Value Date   CREATININE 0.79 02/09/2020   BUN 11 02/09/2020   NA 139 02/09/2020   K 4.7 02/09/2020   CL 102 02/09/2020   CO2 20 02/09/2020   Lab Results  Component Value Date   ALT 13 02/09/2020   AST 11 02/09/2020   ALKPHOS 54 02/09/2020   BILITOT <0.2 02/09/2020   Lab Results  Component Value Date   HGBA1C 5.5 02/09/2020   Lab Results  Component Value Date   INSULIN 13.5 02/09/2020   Lab Results  Component Value Date   TSH 2.330 02/09/2020   Lab Results  Component Value Date   CHOL 206 (H) 02/09/2020   HDL 47 02/09/2020   LDLCALC 123 (H) 02/09/2020   TRIG 203 (H) 02/09/2020   CHOLHDL 3 04/01/2019   Lab Results  Component Value Date   WBC 9.9 02/09/2020   HGB 13.2 02/09/2020   HCT 40.6 02/09/2020   MCV 84 02/09/2020   PLT 428 02/09/2020    Attestation Statements:   Reviewed by clinician on day of visit: allergies, medications, problem list, medical history, surgical history, family history, social history, and previous encounter notes.  02/11/2020, am acting as transcriptionist for Edmund Hilda, MD.   I have reviewed the above documentation for accuracy and completeness, and I agree with the above. - [name], MD

## 2020-02-28 ENCOUNTER — Other Ambulatory Visit: Payer: Self-pay

## 2020-02-28 MED ORDER — TIZANIDINE HCL 4 MG PO TABS: 4.0000 mg | ORAL_TABLET | Freq: Four times a day (QID) | ORAL | 1 refills | Status: DC | PRN

## 2020-03-09 ENCOUNTER — Encounter (INDEPENDENT_AMBULATORY_CARE_PROVIDER_SITE_OTHER): Payer: Self-pay | Admitting: Family Medicine

## 2020-03-09 ENCOUNTER — Ambulatory Visit (INDEPENDENT_AMBULATORY_CARE_PROVIDER_SITE_OTHER): Payer: 59 | Admitting: Family Medicine

## 2020-03-09 ENCOUNTER — Other Ambulatory Visit: Payer: Self-pay

## 2020-03-09 VITALS — BP 124/82 | HR 69 | Temp 97.3°F | Ht 66.0 in | Wt 199.0 lb

## 2020-03-09 DIAGNOSIS — E559 Vitamin D deficiency, unspecified: Secondary | ICD-10-CM

## 2020-03-09 DIAGNOSIS — Z6832 Body mass index (BMI) 32.0-32.9, adult: Secondary | ICD-10-CM | POA: Diagnosis not present

## 2020-03-09 DIAGNOSIS — F32A Depression, unspecified: Secondary | ICD-10-CM

## 2020-03-09 DIAGNOSIS — E669 Obesity, unspecified: Secondary | ICD-10-CM

## 2020-03-09 DIAGNOSIS — F419 Anxiety disorder, unspecified: Secondary | ICD-10-CM

## 2020-03-13 NOTE — Progress Notes (Signed)
Chief Complaint:   OBESITY Diana King is here to discuss her progress with her obesity treatment plan along with follow-up of her obesity related diagnoses. Diana King is on the Category 4 Plan and states she is following her eating plan approximately 70% of the time. Diana King states she is walking 30 minutes 2 times per week.  Today's visit was #: 3 Starting weight: 207 lbs Starting date: 02/09/2020 Today's weight: 199 lbs Today's date: 03/09/2020 Total lbs lost to date: 8 lbs Total lbs lost since last in-office visit: 3 lbs  Interim History: Pt has been doing birthday celebrations over the last few days. She is going out to eat tonight. Pt has been doing fruit and vegetables for snacks. She has been doing more takeout because she forgot to leave food in fridge. She is having hard time eating all protein portions at dinner.  Subjective:   1. Vitamin D deficiency Pt denies nausea, vomiting, and muscle weakness but notes fatigue. Pt is on prescription Vit D. Her last Vit D level is 25.5.  2. Anxiety and depression Pt denies suicidal or homicidal ideations. She is on Wellbutrin, Buspar, and Zoloft. Her symptoms are well controlled.  Assessment/Plan:   1. Vitamin D deficiency Low Vitamin D level contributes to fatigue and are associated with obesity, breast, and colon cancer. She agrees to continue to take prescription Vitamin D @50 ,000 IU every week and will follow-up for routine testing of Vitamin D, at least 2-3 times per year to avoid over-replacement.  2. Anxiety and depression Behavior modification techniques were discussed today to help Diana King deal with her anxiety.  Orders and follow up as documented in patient record. Continue current meds. No refill needed today.  3. Class 1 obesity with serious comorbidity and body mass index (BMI) of 32.0 to 32.9 in adult, unspecified obesity type Diana King is currently in the action stage of change. As such, her goal is to continue with weight loss  efforts. She has agreed to the Category 4 Plan.   Exercise goals: No exercise has been prescribed at this time.  Behavioral modification strategies: increasing lean protein intake, meal planning and cooking strategies, keeping healthy foods in the home and celebration eating strategies.  Diana King has agreed to follow-up with our clinic in 2 weeks. She was informed of the importance of frequent follow-up visits to maximize her success with intensive lifestyle modifications for her multiple health conditions.   Objective:   Blood pressure 124/82, pulse 69, temperature (!) 97.3 F (36.3 C), temperature source Oral, height 5\' 6"  (1.676 m), weight 199 lb (90.3 kg), last menstrual period 03/03/2020, SpO2 98 %. Body mass index is 32.12 kg/m.  General: Cooperative, alert, well developed, in no acute distress. HEENT: Conjunctivae and lids unremarkable. Cardiovascular: Regular rhythm.  Lungs: Normal work of breathing. Neurologic: No focal deficits.   Lab Results  Component Value Date   CREATININE 0.79 02/09/2020   BUN 11 02/09/2020   NA 139 02/09/2020   K 4.7 02/09/2020   CL 102 02/09/2020   CO2 20 02/09/2020   Lab Results  Component Value Date   ALT 13 02/09/2020   AST 11 02/09/2020   ALKPHOS 54 02/09/2020   BILITOT <0.2 02/09/2020   Lab Results  Component Value Date   HGBA1C 5.5 02/09/2020   Lab Results  Component Value Date   INSULIN 13.5 02/09/2020   Lab Results  Component Value Date   TSH 2.330 02/09/2020   Lab Results  Component Value Date  CHOL 206 (H) 02/09/2020   HDL 47 02/09/2020   LDLCALC 123 (H) 02/09/2020   TRIG 203 (H) 02/09/2020   CHOLHDL 3 04/01/2019   Lab Results  Component Value Date   WBC 9.9 02/09/2020   HGB 13.2 02/09/2020   HCT 40.6 02/09/2020   MCV 84 02/09/2020   PLT 428 02/09/2020    Attestation Statements:   Reviewed by clinician on day of visit: allergies, medications, problem list, medical history, surgical history, family history,  social history, and previous encounter notes.  Edmund Hilda, am acting as transcriptionist for [name], MD.   I have reviewed the above documentation for accuracy and completeness, and I agree with the above. - Katherina Mires, MD

## 2020-03-23 ENCOUNTER — Ambulatory Visit (INDEPENDENT_AMBULATORY_CARE_PROVIDER_SITE_OTHER): Payer: 59 | Admitting: Adult Health

## 2020-03-23 ENCOUNTER — Other Ambulatory Visit: Payer: Self-pay

## 2020-03-23 ENCOUNTER — Encounter (INDEPENDENT_AMBULATORY_CARE_PROVIDER_SITE_OTHER): Payer: Self-pay | Admitting: Adult Health

## 2020-03-23 VITALS — BP 114/73 | HR 77 | Temp 97.8°F | Ht 66.0 in | Wt 201.0 lb

## 2020-03-23 DIAGNOSIS — Z9189 Other specified personal risk factors, not elsewhere classified: Secondary | ICD-10-CM | POA: Diagnosis not present

## 2020-03-23 DIAGNOSIS — Z6832 Body mass index (BMI) 32.0-32.9, adult: Secondary | ICD-10-CM

## 2020-03-23 DIAGNOSIS — F32A Depression, unspecified: Secondary | ICD-10-CM

## 2020-03-23 DIAGNOSIS — E669 Obesity, unspecified: Secondary | ICD-10-CM

## 2020-03-23 DIAGNOSIS — E559 Vitamin D deficiency, unspecified: Secondary | ICD-10-CM | POA: Diagnosis not present

## 2020-03-23 DIAGNOSIS — F419 Anxiety disorder, unspecified: Secondary | ICD-10-CM

## 2020-03-23 MED ORDER — VITAMIN D (ERGOCALCIFEROL) 1.25 MG (50000 UNIT) PO CAPS: 50000.0000 [IU] | ORAL_CAPSULE | ORAL | 0 refills | Status: DC

## 2020-03-27 NOTE — Progress Notes (Signed)
Chief Complaint:   OBESITY Diana King is here to discuss her progress with her obesity treatment plan along with follow-up of her obesity related diagnoses. Diana King is on the Category 4 Plan and states she is following her eating plan approximately 80% of the time. Diana King states she is walking and doing boot camp 25-45 minutes 2-3 times per week.  Today's visit was #: 4 Starting weight: 207 lbs Starting date: 01/22/2020 Today's weight: 201 lbs Today's date: 03/23/2020 Total lbs lost to date: 6 lbs Total lbs lost since last in-office visit: 0  Interim History: Diana King and her [place] mo[place] trip for her husband's birthday. They traveled to the [employer] for 4 days for her birthday celebration.  Subjective:   1. Vitamin D deficiency Diana King's Vitamin D level was 25.5 on 02/09/2020 (below goal of 50). She is currently taking prescription vitamin D 50,000 IU each week. She denies nausea, vomiting or muscle weakness.   Ref. Range 02/09/2020 09:29  Vitamin D, 25-Hydroxy Latest Ref Range: 30.0 - 100.0 ng/mL 25.5 (L)   2. Anxiety and depression Diana King reports she is stable. She denies suicidal or homicidal ideations. She is on Wellbutrin XL 150 mg daily, Buspar 5 mg, and Zoloft 50 mg every night.   3. At risk for osteoporosis Diana King is at higher risk of osteopenia and osteoporosis due to Vitamin D deficiency.   Assessment/Plan:   1. Vitamin D deficiency Low Vitamin D level contributes to fatigue and are associated with obesity, breast, and colon cancer. She agrees to continue to take prescription Vitamin D @50 ,000 IU every week and will follow-up for routine testing of Vitamin D, at least 2-3 times per year to avoid over-replacement.  - Vitamin D, Ergocalciferol, (DRISDOL) 1.25 MG (50000 UNIT) CAPS capsule; Take 1 capsule (50,000 Units total) by mouth every 7 (seven) days.  Dispense: 4 capsule; Refill: 0  2. Anxiety and depression Behavior modification  techniques were discussed today to help Diana King deal with her anxiety.  Orders and follow up as documented in patient record. Continue current medication regimen.  3. At risk for osteoporosis Diana King was given approximately 15 minutes of osteoporosis prevention counseling today. Diana King is at risk for osteopenia and osteoporosis due to her Vitamin D deficiency. She was encouraged to take her Vitamin D and follow her higher calcium diet and increase strengthening exercise to help strengthen her bones and decrease her risk of osteopenia and osteoporosis.  Repetitive spaced learning was employed today to elicit superior memory formation and behavioral change.  4. Class 1 obesity with serious comorbidity and body mass index (BMI) of 32.0 to 32.9 in adult, unspecified obesity type Diana King is currently in the action stage of change. As such, her goal is to continue with weight loss efforts. She has agreed to the Category 4 Plan.   Exercise goals: As is  Behavioral modification strategies: increasing lean protein intake, meal planning and cooking strategies and planning for success.  Diana King has agreed to follow-up with our clinic in 2 weeks. She was informed of the importance of frequent follow-up visits to maximize her success with intensive lifestyle modifications for her multiple health conditions.   Objective:   Blood pressure 114/73, pulse 77, temperature 97.8 F (36.6 C), height 5\' 6"  (1.676 m), weight 201 lb (91.2 kg), last menstrual period 03/03/2020, SpO2 97 %. Body mass index is 32.44 kg/m.  General: Cooperative, alert, well developed, in no acute distress. HEENT: Conjunctivae and lids unremarkable. Cardiovascular:  Regular rhythm.  Lungs: Normal work of breathing. Neurologic: No focal deficits.   Lab Results  Component Value Date   CREATININE 0.79 02/09/2020   BUN 11 02/09/2020   NA 139 02/09/2020   K 4.7 02/09/2020   CL 102 02/09/2020   CO2 20 02/09/2020   Lab Results   Component Value Date   ALT 13 02/09/2020   AST 11 02/09/2020   ALKPHOS 54 02/09/2020   BILITOT <0.2 02/09/2020   Lab Results  Component Value Date   HGBA1C 5.5 02/09/2020   Lab Results  Component Value Date   INSULIN 13.5 02/09/2020   Lab Results  Component Value Date   TSH 2.330 02/09/2020   Lab Results  Component Value Date   CHOL 206 (H) 02/09/2020   HDL 47 02/09/2020   LDLCALC 123 (H) 02/09/2020   TRIG 203 (H) 02/09/2020   CHOLHDL 3 04/01/2019   Lab Results  Component Value Date   WBC 9.9 02/09/2020   HGB 13.2 02/09/2020   HCT 40.6 02/09/2020   MCV 84 02/09/2020   PLT 428 02/09/2020    Attestation Statements:   Reviewed by clinician on day of visit: allergies, medications, problem list, medical history, surgical history, family history, social history, and previous encounter notes.  Edmund Hilda, am acting as Energy manager for William Hamburger, NP.  I have reviewed the above documentation for accuracy and completeness, and I agree with the above. -  Estrellita Lasky d. Kristianna Saperstein, NP-C

## 2020-03-29 ENCOUNTER — Other Ambulatory Visit: Payer: Self-pay | Admitting: Internal Medicine

## 2020-04-01 ENCOUNTER — Other Ambulatory Visit: Payer: Self-pay | Admitting: Internal Medicine

## 2020-04-06 ENCOUNTER — Ambulatory Visit (INDEPENDENT_AMBULATORY_CARE_PROVIDER_SITE_OTHER): Payer: 59 | Admitting: Family Medicine

## 2020-04-06 ENCOUNTER — Other Ambulatory Visit: Payer: Self-pay

## 2020-04-06 ENCOUNTER — Encounter (INDEPENDENT_AMBULATORY_CARE_PROVIDER_SITE_OTHER): Payer: Self-pay | Admitting: Family Medicine

## 2020-04-06 VITALS — BP 122/77 | HR 85 | Temp 98.0°F | Ht 66.0 in | Wt 200.0 lb

## 2020-04-06 DIAGNOSIS — Z6832 Body mass index (BMI) 32.0-32.9, adult: Secondary | ICD-10-CM | POA: Diagnosis not present

## 2020-04-06 DIAGNOSIS — E669 Obesity, unspecified: Secondary | ICD-10-CM | POA: Diagnosis not present

## 2020-04-06 DIAGNOSIS — E8881 Metabolic syndrome: Secondary | ICD-10-CM

## 2020-04-06 DIAGNOSIS — Z9189 Other specified personal risk factors, not elsewhere classified: Secondary | ICD-10-CM | POA: Diagnosis not present

## 2020-04-06 DIAGNOSIS — E559 Vitamin D deficiency, unspecified: Secondary | ICD-10-CM | POA: Diagnosis not present

## 2020-04-06 MED ORDER — METFORMIN HCL 500 MG PO TABS: 500.0000 mg | ORAL_TABLET | Freq: Every day | ORAL | 0 refills | Status: DC

## 2020-04-10 NOTE — Progress Notes (Signed)
Chief Complaint:   OBESITY Diana King is here to discuss her progress with her obesity treatment plan along with follow-up of her obesity related diagnoses. Diana King is on the Category 4 Plan and states she is following her eating plan approximately 80% of the time. Diana King states she is walking and doing burn boot camp 20-45 minutes 4-5 times per week.  Today's visit was #: 5 Starting weight: 207 lbs Starting date: 02/09/2020 Today's weight: 200 lbs Today's date: 04/06/2020 Total lbs lost to date: 7 lbs Total lbs lost since last in-office visit: 1 lb  Interim History: Diana King feels she has more energy and is feeling better in general. She has been following the meal plan and feeling down with lack of weight loss. Exercise wise, she has started doing burn boot camp.  Subjective:   1. Insulin resistance Diana King's A1c is 5.5 with an insulin level of 13.5. She is not on medication.  Lab Results  Component Value Date   INSULIN 13.5 02/09/2020   Lab Results  Component Value Date   HGBA1C 5.5 02/09/2020    2. Vitamin D deficiency Diana King denies nausea, vomiting, and muscle weakness but notes fatigue. She is on prescription Vit D.  3. At risk for diabetes mellitus Diana King is at higher than average risk for developing diabetes due to obesity.   Assessment/Plan:   1. Insulin resistance Diana King will continue to work on weight loss, exercise, and decreasing simple carbohydrates to help decrease the risk of diabetes. Diana King agreed to follow-up with [place] as directed to closely monitor her progress. Start Metformin 500 mg, as per below.  - metFORMIN (GLUCOPHAGE) 500 MG tablet; Take 1 tablet (500 mg total) by mouth daily with breakfast.  Dispense: 30 tablet; Refill: 0  2. Vitamin D deficiency Low Vitamin D level contributes to fatigue and are associated with obesity, breast, and colon cancer. She agrees to continue to take prescription Vitamin D @50 ,000 IU every week and will follow-up for  routine testing of Vitamin D, at least 2-3 times per year to avoid over-replacement.  3. At risk for diabetes mellitus Diana King was given approximately 15 minutes of diabetes education and counseling today. We discussed intensive lifestyle modifications today with an emphasis on weight loss as well as increasing exercise and decreasing simple carbohydrates in her diet. We also reviewed medication options with an emphasis on risk versus benefit of those discussed.   Repetitive spaced learning was employed today to elicit superior memory formation and behavioral change.  4. Class 1 obesity with serious comorbidity and body mass index (BMI) of 32.0 to 32.9 in adult, unspecified obesity type Diana King is currently in the action stage of change. As such, her goal is to continue with weight loss efforts. She has agreed to the Category 4 Plan.   Exercise goals: As is  Behavioral modification strategies: increasing lean protein intake, meal planning and cooking strategies, keeping healthy foods in the home and planning for success.  Diana King has agreed to follow-up with our clinic in 2 weeks. She was informed of the importance of frequent follow-up visits to maximize her success with intensive lifestyle modifications for her multiple health conditions.   Objective:   Blood pressure 122/77, pulse 85, temperature 98 F (36.7 C), temperature source Oral, height 5\' 6"  (1.676 m), weight 200 lb (90.7 kg), last menstrual period 04/01/2020, SpO2 96 %. Body mass index is 32.28 kg/m.  General: Cooperative, alert, well developed, in no acute distress. HEENT: Conjunctivae and lids unremarkable. Cardiovascular: Regular  rhythm.  Lungs: Normal work of breathing. Neurologic: No focal deficits.   Lab Results  Component Value Date   CREATININE 0.79 02/09/2020   BUN 11 02/09/2020   NA 139 02/09/2020   K 4.7 02/09/2020   CL 102 02/09/2020   CO2 20 02/09/2020   Lab Results  Component Value Date   ALT 13  02/09/2020   AST 11 02/09/2020   ALKPHOS 54 02/09/2020   BILITOT <0.2 02/09/2020   Lab Results  Component Value Date   HGBA1C 5.5 02/09/2020   Lab Results  Component Value Date   INSULIN 13.5 02/09/2020   Lab Results  Component Value Date   TSH 2.330 02/09/2020   Lab Results  Component Value Date   CHOL 206 (H) 02/09/2020   HDL 47 02/09/2020   LDLCALC 123 (H) 02/09/2020   TRIG 203 (H) 02/09/2020   CHOLHDL 3 04/01/2019   Lab Results  Component Value Date   WBC 9.9 02/09/2020   HGB 13.2 02/09/2020   HCT 40.6 02/09/2020   MCV 84 02/09/2020   PLT 428 02/09/2020   No results found for: IRON, TIBC, FERRITIN   Attestation Statements:   Reviewed by clinician on day of visit: allergies, medications, problem list, medical history, surgical history, family history, social history, and previous encounter notes.  Edmund Hilda, am acting as transcriptionist for [name], MD.   I have reviewed the above documentation for accuracy and completeness, and I agree with the above. - Katherina Mires, MD

## 2020-04-13 ENCOUNTER — Encounter: Payer: Self-pay | Admitting: Internal Medicine

## 2020-04-13 NOTE — Patient Instructions (Addendum)
Medications changes include :   Increase bupropion to 300 mg daily  Your prescription(s) have been submitted to your pharmacy. Please take as directed and contact our office if you believe you are having problem(s) with the medication(s).     Please followup in 1 year    Health Maintenance, Female Adopting a healthy lifestyle and getting preventive care are important in promoting health and wellness. Ask your health care provider about:  The right schedule for you to have regular tests and exams.  Things you can do on your own to prevent diseases and keep yourself healthy. What should I know about diet, weight, and exercise? Eat a healthy diet  Eat a diet that includes plenty of vegetables, fruits, low-fat dairy products, and lean protein.  Do not eat a lot of foods that are high in solid fats, added sugars, or sodium.   Maintain a healthy weight Body mass index (BMI) is used to identify weight problems. It estimates body fat based on height and weight. Your health care provider can help determine your BMI and help you achieve or maintain a healthy weight. Get regular exercise Get regular exercise. This is one of the most important things you can do for your health. Most adults should:  Exercise for at least 150 minutes each week. The exercise should increase your heart rate and make you sweat (moderate-intensity exercise).  Do strengthening exercises at least twice a week. This is in addition to the moderate-intensity exercise.  Spend less time sitting. Even light physical activity can be beneficial. Watch cholesterol and blood lipids Have your blood tested for lipids and cholesterol at 32 years of age, then have this test every 5 years. Have your cholesterol levels checked more often if:  Your lipid or cholesterol levels are high.  You are older than 32 years of age.  You are at high risk for heart disease. What should I know about cancer screening? Depending on your  health history and family history, you may need to have cancer screening at various ages. This may include screening for:  Breast cancer.  Cervical cancer.  Colorectal cancer.  Skin cancer.  Lung cancer. What should I know about heart disease, diabetes, and high blood pressure? Blood pressure and heart disease  High blood pressure causes heart disease and increases the risk of stroke. This is more likely to develop in people who have high blood pressure readings, are of African descent, or are overweight.  Have your blood pressure checked: ? Every 3-5 years if you are 32-58 years of age. ? Every year if you are 51 years old or older. Diabetes Have regular diabetes screenings. This checks your fasting blood sugar level. Have the screening done:  Once every three years after age 32 if you are at a normal weight and have a low risk for diabetes.  More often and at a younger age if you are overweight or have a high risk for diabetes. What should I know about preventing infection? Hepatitis B If you have a higher risk for hepatitis B, you should be screened for this virus. Talk with your health care provider to find out if you are at risk for hepatitis B infection. Hepatitis C Testing is recommended for:  Everyone born from [birth year] through 1967.  Anyone with known risk factors for hepatitis C. Sexually transmitted infections (STIs)  Get screened for STIs, including gonorrhea and chlamydia, if: ? You are sexually active and are younger than 32 years of age. ?  You are older than 32 years of age and your health care provider tells you that you are at risk for this type of infection. ? Your sexual activity has changed since you were last screened, and you are at increased risk for chlamydia or gonorrhea. Ask your health care provider if you are at risk.  Ask your health care provider about whether you are at high risk for HIV. Your health care provider may recommend a prescription  medicine to help prevent HIV infection. If you choose to take medicine to prevent HIV, you should first get tested for HIV. You should then be tested every 3 months for as long as you are taking the medicine. Pregnancy  If you are about to stop having your period (premenopausal) and you may become pregnant, seek counseling before you get pregnant.  Take 400 to 800 micrograms (mcg) of folic acid every day if you become pregnant.  Ask for birth control (contraception) if you want to prevent pregnancy. Osteoporosis and menopause Osteoporosis is a disease in which the bones lose minerals and strength with aging. This can result in bone fractures. If you are 56 years old or older, or if you are at risk for osteoporosis and fractures, ask your health care provider if you should:  Be screened for bone loss.  Take a calcium or vitamin D supplement to lower your risk of fractures.  Be given hormone replacement therapy (HRT) to treat symptoms of menopause. Follow these instructions at home: Lifestyle  Do not use any products that contain nicotine or tobacco, such as cigarettes, e-cigarettes, and chewing tobacco. If you need help quitting, ask your health care provider.  Do not use street drugs.  Do not share needles.  Ask your health care provider for help if you need support or information about quitting drugs. Alcohol use  Do not drink alcohol if: ? Your health care provider tells you not to drink. ? You are pregnant, may be pregnant, or are planning to become pregnant.  If you drink alcohol: ? Limit how much you use to 0-1 drink a day. ? Limit intake if you are breastfeeding.  Be aware of how much alcohol is in your drink. In the [place] one drink equals one 12 oz bottle of beer (355 mL), one 5 oz glass of wine (148 mL), or one 1 oz glass of hard liquor (44 mL). General instructions  Schedule regular health, dental, and eye exams.  Stay current with your vaccines.  Tell your health  care provider if: ? You often feel depressed. ? You have ever been abused or do not feel safe at home. Summary  Adopting a healthy lifestyle and getting preventive care are important in promoting health and wellness.  Follow your health care provider's instructions about healthy diet, exercising, and getting tested or screened for diseases.  Follow your health care provider's instructions on monitoring your cholesterol and blood pressure. This information is not intended to replace advice given to you by your health care provider. Make sure you discuss any questions you have with your health care provider. Document Revised: 12/31/2017 Document Reviewed: 12/31/2017 Elsevier Patient Education  2021 [employer].

## 2020-04-13 NOTE — Progress Notes (Signed)
Subjective:    Patient ID: Diana King, female    DOB: [birth year], 30 y.o.   MRN: [id number]   This visit occurred during the SARS-CoV-2 public health emergency.  Safety protocols were in place, including screening questions prior to the visit, additional usage of staff PPE, and extensive cleaning of exam room while observing appropriate contact time as indicated for disinfecting solutions.    HPI She is here for a physical exam.   Auras w/o migraines - she had two this month - 5th, 8th.  They last 30 min.  She does have a history of migraines.   Anxiety, depression - ? Inc Wellbutrin.  Has low points during menses.  Last one lasted 4 months - ended up eating a lot and gained a lot of weight.   Taking 2 buspar in the am during her menses and sometimes needs an extra one during the day.     Medications and allergies reviewed with patient and updated if appropriate.  Patient Active Problem List   Diagnosis Date Noted  . Migraine aura without headache 04/14/2020  . Vitamin D deficiency 03/23/2020  . Left Achilles tendinitis 11/25/2019  . Acne 04/02/2019  . Poor posture 07/06/2018  . Nonallopathic lesion of sacral region 09/30/2017  . Nonallopathic lesion of lumbosacral region 06/18/2017  . Anxiety and depression 06/11/2017  . Low back pain 06/11/2017  . Class 1 obesity with serious comorbidity and body mass index (BMI) of 32.0 to 32.9 in adult 06/11/2017    Current Outpatient Medications on File Prior to Visit  Medication Sig Dispense Refill  . Adapalene 0.3 % gel Apply 1 application topically at bedtime. 45 g 5  . busPIRone (BUSPAR) 5 MG tablet TAKE 1 TABLET BY MOUTH THREE TIMES A DAY 270 tablet 1  . clindamycin (CLINDAGEL) 1 % gel Apply topically 2 (two) times daily.    . drospirenone-ethinyl estradiol (YAZ,GIANVI,LORYNA) 3-0.02 MG tablet Take 1 tablet by mouth daily.    . meloxicam (MOBIC) 7.5 MG tablet Take 1 tablet (7.5 mg total) by mouth daily. 30 tablet 0  .  metFORMIN (GLUCOPHAGE) 500 MG tablet Take 1 tablet (500 mg total) by mouth daily with breakfast. 30 tablet 0  . sertraline (ZOLOFT) 50 MG tablet TAKE 1 TABLET BY MOUTH EVERYDAY AT BEDTIME 90 tablet 2  . spironolactone (ALDACTONE) 25 MG tablet Take 25 mg by mouth daily.    Marland Kitchen tiZANidine (ZANAFLEX) 4 MG tablet Take 1 tablet (4 mg total) by mouth every 6 (six) hours as needed for muscle spasms. 30 tablet 1  . Vitamin D, Ergocalciferol, (DRISDOL) 1.25 MG (50000 UNIT) CAPS capsule Take 1 capsule (50,000 Units total) by mouth every 7 (seven) days. 4 capsule 0   No current facility-administered medications on file prior to visit.    Past Medical History:  Diagnosis Date  . Acne   . Anxiety   . Back pain   . Depression   . GERD (gastroesophageal reflux disease)   . IBS (irritable bowel syndrome)   . Overweight   . Pain in Achilles tendon   . Palpitations     Past Surgical History:  Procedure Laterality Date  . NO PAST SURGERIES      Social History   Socioeconomic History  . Marital status: Married    Spouse name: Amalia Hailey  . Number of children: 1  . Years of education: Not on file  . Highest education level: Not on file  Occupational History  . Occupation: CMA  Tobacco Use  . Smoking status: Never Smoker  . Smokeless tobacco: Never Used  Substance and Sexual Activity  . Alcohol use: Yes    Comment: social  . Drug use: Not on file  . Sexual activity: Not on file  Other Topics Concern  . Not on file  Social History Narrative  . Not on file   Social Determinants of Health   Financial Resource Strain: Not on file  Food Insecurity: Not on file  Transportation Needs: Not on file  Physical Activity: Not on file  Stress: Not on file  Social Connections: Not on file    Family History  Problem Relation Age of Onset  . Fibromyalgia Mother   . Interstitial cystitis Mother   . Migraines Mother   . Depression Mother   . Anxiety disorder Mother   . Alcohol abuse Maternal Aunt    . Mental illness Maternal Aunt   . Alcohol abuse Maternal Uncle   . Thyroid disease Maternal Grandmother   . Hyperlipidemia Father   . Alcohol abuse Father     Review of Systems  Constitutional: Negative for chills, fatigue and fever.  Eyes: Positive for visual disturbance (aura no HA).  Respiratory: Negative for cough, shortness of breath and wheezing.   Cardiovascular: Negative for chest pain, palpitations and leg swelling.  Gastrointestinal: Positive for nausea (related to metformin). Negative for abdominal pain, blood in stool, constipation and diarrhea.       No gerd  Genitourinary: Negative for dysuria and hematuria.  Musculoskeletal: Positive for back pain (intermittent). Negative for arthralgias.  Skin: Negative for rash.  Neurological: Negative for dizziness, light-headedness and headaches.  Psychiatric/Behavioral: Positive for dysphoric mood. The patient is nervous/anxious.        Objective:   Vitals:   04/14/20 0748  BP: 124/80  Pulse: 86  Temp: 98.2 F (36.8 C)  SpO2: 99%   Filed Weights   04/14/20 0748  Weight: 198 lb (89.8 kg)   Body mass index is 31.96 kg/m.  BP Readings from Last 3 Encounters:  04/14/20 124/80  04/06/20 122/77  03/23/20 114/73    Wt Readings from Last 3 Encounters:  04/14/20 198 lb (89.8 kg)  04/06/20 200 lb (90.7 kg)  03/23/20 201 lb (91.2 kg)    Depression screen [facility] 2/9 04/14/2020 02/09/2020 04/02/2019 08/31/2018 05/22/2018  Decreased Interest 0 3 0 3 3  Down, Depressed, Hopeless 0 3 0 2 0  PHQ - 2 Score 0 6 0 5 3  Altered sleeping 1 2 1 3 2   Tired, decreased energy 1 3 1 3 3   Change in appetite 0 3 0 3 3  Feeling bad or failure about yourself  0 3 0 2 0  Trouble concentrating 0 0 0 0 0  Moving slowly or fidgety/restless 0 0 0 0 0  Suicidal thoughts 0 0 0 0 0  PHQ-9 Score 2 17 2 16 11   Difficult doing work/chores Not difficult at all Not difficult at all - - -    GAD 7 : Generalized Anxiety Score 04/14/2020 04/02/2019  08/31/2018 05/22/2018  Nervous, Anxious, on Edge 1 0 0 0  Control/stop worrying 0 0 0 0  Worry too much - different things 0 0 0 0  Trouble relaxing 0 0 1 0  Restless 0 0 0 1  Easily annoyed or irritable 1 1 3 2   Afraid - awful might happen 0 0 0 0  Total GAD 7 Score 2 1 4 3   Anxiety Difficulty Not  difficult at all - - -       Physical Exam Constitutional: She appears well-developed and well-nourished. No distress.  HENT:  Head: Normocephalic and atraumatic.  Right Ear: External ear normal. Normal ear canal and TM Left Ear: External ear normal.  Normal ear canal and TM Mouth/Throat: Oropharynx is clear and moist.  Eyes: Conjunctivae and EOM are normal.  Neck: Neck supple. No tracheal deviation present. No thyromegaly present.  No carotid bruit  Cardiovascular: Normal rate, regular rhythm and normal heart sounds.   No murmur heard.  No edema. Pulmonary/Chest: Effort normal and breath sounds normal. No respiratory distress. She has no wheezes. She has no rales.  Breast: deferred   Abdominal: Soft. She exhibits no distension. There is no tenderness.  Lymphadenopathy: She has no cervical adenopathy.  Skin: Skin is warm and dry. She is not diaphoretic.  Psychiatric: She has a normal mood and affect. Her behavior is normal.        Assessment & Plan:   Physical exam: Screening blood work    deferred Immunizations   Up to date  Gyn  Up to date  Exercise  regular Weight  Working on weight loss - [facility] Substance abuse  none      See Problem List for Assessment and Plan of chronic medical problems.

## 2020-04-14 ENCOUNTER — Encounter: Payer: Self-pay | Admitting: Internal Medicine

## 2020-04-14 ENCOUNTER — Other Ambulatory Visit: Payer: Self-pay

## 2020-04-14 ENCOUNTER — Ambulatory Visit (INDEPENDENT_AMBULATORY_CARE_PROVIDER_SITE_OTHER): Payer: 59 | Admitting: Internal Medicine

## 2020-04-14 VITALS — BP 124/80 | HR 86 | Temp 98.2°F | Ht 66.0 in | Wt 198.0 lb

## 2020-04-14 DIAGNOSIS — F419 Anxiety disorder, unspecified: Secondary | ICD-10-CM | POA: Diagnosis not present

## 2020-04-14 DIAGNOSIS — F32A Depression, unspecified: Secondary | ICD-10-CM

## 2020-04-14 DIAGNOSIS — G43109 Migraine with aura, not intractable, without status migrainosus: Secondary | ICD-10-CM | POA: Diagnosis not present

## 2020-04-14 DIAGNOSIS — Z0000 Encounter for general adult medical examination without abnormal findings: Secondary | ICD-10-CM | POA: Diagnosis not present

## 2020-04-14 MED ORDER — BUPROPION HCL ER (XL) 300 MG PO TB24
300.0000 mg | ORAL_TABLET | Freq: Every day | ORAL | 1 refills | Status: DC
Start: 2020-04-14 — End: 2020-11-08

## 2020-04-14 NOTE — Assessment & Plan Note (Signed)
New Has h/o migraines - this is new Reassured

## 2020-04-14 NOTE — Assessment & Plan Note (Addendum)
Chronic Not ideally controlled Increase Wellbutrin to 300 mg daily Continue bupsar 5 mg TID prn

## 2020-04-25 ENCOUNTER — Ambulatory Visit (INDEPENDENT_AMBULATORY_CARE_PROVIDER_SITE_OTHER): Payer: 59 | Admitting: Family Medicine

## 2020-04-25 ENCOUNTER — Other Ambulatory Visit: Payer: Self-pay

## 2020-04-25 ENCOUNTER — Encounter (INDEPENDENT_AMBULATORY_CARE_PROVIDER_SITE_OTHER): Payer: Self-pay | Admitting: Family Medicine

## 2020-04-25 VITALS — BP 121/75 | HR 73 | Temp 98.0°F | Ht 66.0 in | Wt 193.0 lb

## 2020-04-25 DIAGNOSIS — E559 Vitamin D deficiency, unspecified: Secondary | ICD-10-CM

## 2020-04-25 DIAGNOSIS — E669 Obesity, unspecified: Secondary | ICD-10-CM | POA: Diagnosis not present

## 2020-04-25 DIAGNOSIS — E8881 Metabolic syndrome: Secondary | ICD-10-CM

## 2020-04-25 DIAGNOSIS — Z9189 Other specified personal risk factors, not elsewhere classified: Secondary | ICD-10-CM

## 2020-04-25 DIAGNOSIS — Z6833 Body mass index (BMI) 33.0-33.9, adult: Secondary | ICD-10-CM

## 2020-04-25 MED ORDER — VITAMIN D (ERGOCALCIFEROL) 1.25 MG (50000 UNIT) PO CAPS: 50000.0000 [IU] | ORAL_CAPSULE | ORAL | 0 refills | Status: DC

## 2020-04-28 ENCOUNTER — Other Ambulatory Visit (INDEPENDENT_AMBULATORY_CARE_PROVIDER_SITE_OTHER): Payer: Self-pay | Admitting: Family Medicine

## 2020-04-28 DIAGNOSIS — E8881 Metabolic syndrome: Secondary | ICD-10-CM

## 2020-04-30 NOTE — Progress Notes (Signed)
Chief Complaint:   OBESITY Diana King is here to discuss her progress with her obesity treatment plan along with follow-up of her obesity related diagnoses. Diana King is on the Category 4 Plan and states she is following her eating plan approximately 97% of the time. Diana King states she is doing high intensity workout 45 minutes 4 times per week.  Today's visit was #: 6 Starting weight: 207 lbs Starting date: 02/09/2020 Today's weight: 193 lbs Today's date: 04/25/2020 Total lbs lost to date: 14 lbs Total lbs lost since last in-office visit: 7  Interim History: Last few weeks pt has been eating and exercising. Pt has had recent increase in Wellbutrin and is doing well on increased dose of medication. She is doing microwave meal for lunch option and pt is enjoying that. She anticipates getting [employer] meal for Easter.  Subjective:   1. Vitamin D deficiency Diana King denies nausea, vomiting, and muscle weakness but notes fatigue. Pt is on prescription Vit D. Her last A1c was 25.5.  2. Insulin resistance Diana King is on Metformin daily. She reports GI upset in form of nausea and diarrhea.   3. At risk for osteoporosis Diana King is at higher risk of osteopenia and osteoporosis due to Vitamin D deficiency.   Assessment/Plan:   1. Vitamin D deficiency Low Vitamin D level contributes to fatigue and are associated with obesity, breast, and colon cancer. She agrees to continue to take prescription Vitamin D @50 ,000 IU every week and will follow-up for routine testing of Vitamin D, at least 2-3 times per year to avoid over-replacement.  - Vitamin D, Ergocalciferol, (DRISDOL) 1.25 MG (50000 UNIT) CAPS capsule; Take 1 capsule (50,000 Units total) by mouth every 7 (seven) days.  Dispense: 4 capsule; Refill: 0  2. Insulin resistance Diana King will continue to work on weight loss, exercise, and decreasing simple carbohydrates to help decrease the risk of diabetes. Diana King agreed to follow-up with Luanna Cole as  directed to closely monitor her progress. Continue Metformin. No refill needed today.  3. At risk for osteoporosis Diana King was given approximately 15 minutes of osteoporosis prevention counseling today. Diana King is at risk for osteopenia and osteoporosis due to her Vitamin D deficiency. She was encouraged to take her Vitamin D and follow her higher calcium diet and increase strengthening exercise to help strengthen her bones and decrease her risk of osteopenia and osteoporosis.  Repetitive spaced learning was employed today to elicit superior memory formation and behavioral change.  4. Class 1 obesity with serious comorbidity and body mass index (BMI) of 33.0 to 33.9 in adult, unspecified obesity type Diana King is currently in the action stage of change. As such, her goal is to continue with weight loss efforts. She has agreed to the Category 4 Plan.   Exercise goals: As is  Behavioral modification strategies: increasing lean protein intake, meal planning and cooking strategies and keeping healthy foods in the home.  Diana King has agreed to follow-up with our clinic in 3 weeks. She was informed of the importance of frequent follow-up visits to maximize her success with intensive lifestyle modifications for her multiple health conditions.   Objective:   Blood pressure 121/75, pulse 73, temperature 98 F (36.7 C), height 5\' 6"  (1.676 m), weight 193 lb (87.5 kg), last menstrual period 04/01/2020, SpO2 97 %. Body mass index is 31.15 kg/m.  General: Cooperative, alert, well developed, in no acute distress. HEENT: Conjunctivae and lids unremarkable. Cardiovascular: Regular rhythm.  Lungs: Normal work of breathing. Neurologic: No focal deficits.  Lab Results  Component Value Date   CREATININE 0.79 02/09/2020   BUN 11 02/09/2020   NA 139 02/09/2020   K 4.7 02/09/2020   CL 102 02/09/2020   CO2 20 02/09/2020   Lab Results  Component Value Date   ALT 13 02/09/2020   AST 11 02/09/2020   ALKPHOS  54 02/09/2020   BILITOT <0.2 02/09/2020   Lab Results  Component Value Date   HGBA1C 5.5 02/09/2020   Lab Results  Component Value Date   INSULIN 13.5 02/09/2020   Lab Results  Component Value Date   TSH 2.330 02/09/2020   Lab Results  Component Value Date   CHOL 206 (H) 02/09/2020   HDL 47 02/09/2020   LDLCALC 123 (H) 02/09/2020   TRIG 203 (H) 02/09/2020   CHOLHDL 3 04/01/2019   Lab Results  Component Value Date   WBC 9.9 02/09/2020   HGB 13.2 02/09/2020   HCT 40.6 02/09/2020   MCV 84 02/09/2020   PLT 428 02/09/2020    Attestation Statements:   Reviewed by clinician on day of visit: allergies, medications, problem list, medical history, surgical history, family history, social history, and previous encounter notes.   Edmund Hilda, am acting as transcriptionist for [name], MD.  I have reviewed the above documentation for accuracy and completeness, and I agree with the above. - Katherina Mires, MD

## 2020-05-01 NOTE — Telephone Encounter (Signed)
Dr.Ukleja 

## 2020-05-02 NOTE — Telephone Encounter (Signed)
Please advise if you would like to refill or have pt wait until next f/u appt.

## 2020-05-08 ENCOUNTER — Other Ambulatory Visit (INDEPENDENT_AMBULATORY_CARE_PROVIDER_SITE_OTHER): Payer: Self-pay | Admitting: Family Medicine

## 2020-05-08 ENCOUNTER — Encounter (INDEPENDENT_AMBULATORY_CARE_PROVIDER_SITE_OTHER): Payer: Self-pay | Admitting: Family Medicine

## 2020-05-08 DIAGNOSIS — E8881 Metabolic syndrome: Secondary | ICD-10-CM

## 2020-05-08 NOTE — Telephone Encounter (Signed)
Last seen Dr Ukleja 

## 2020-05-08 NOTE — Telephone Encounter (Signed)
Refill request

## 2020-05-09 NOTE — Telephone Encounter (Signed)
Dr.Ukleja 

## 2020-05-09 NOTE — Telephone Encounter (Signed)
Please refill.

## 2020-05-11 ENCOUNTER — Other Ambulatory Visit: Payer: Self-pay

## 2020-05-11 ENCOUNTER — Encounter (INDEPENDENT_AMBULATORY_CARE_PROVIDER_SITE_OTHER): Payer: Self-pay | Admitting: Family Medicine

## 2020-05-11 ENCOUNTER — Ambulatory Visit (INDEPENDENT_AMBULATORY_CARE_PROVIDER_SITE_OTHER): Payer: 59 | Admitting: Family Medicine

## 2020-05-11 VITALS — BP 121/75 | HR 75 | Temp 97.6°F | Ht 66.0 in | Wt 191.0 lb

## 2020-05-11 DIAGNOSIS — Z9189 Other specified personal risk factors, not elsewhere classified: Secondary | ICD-10-CM | POA: Diagnosis not present

## 2020-05-11 DIAGNOSIS — E669 Obesity, unspecified: Secondary | ICD-10-CM | POA: Diagnosis not present

## 2020-05-11 DIAGNOSIS — E559 Vitamin D deficiency, unspecified: Secondary | ICD-10-CM

## 2020-05-11 DIAGNOSIS — E8881 Metabolic syndrome: Secondary | ICD-10-CM

## 2020-05-11 DIAGNOSIS — Z6833 Body mass index (BMI) 33.0-33.9, adult: Secondary | ICD-10-CM | POA: Diagnosis not present

## 2020-05-11 DIAGNOSIS — E88819 Insulin resistance, unspecified: Secondary | ICD-10-CM

## 2020-05-11 MED ORDER — VITAMIN D (ERGOCALCIFEROL) 1.25 MG (50000 UNIT) PO CAPS: 50000.0000 [IU] | ORAL_CAPSULE | ORAL | 0 refills | Status: DC

## 2020-05-15 NOTE — Progress Notes (Signed)
Chief Complaint:   OBESITY Diana King is here to discuss her progress with her obesity treatment plan along with follow-up of her obesity related diagnoses. Diana King is on the Category 4 Plan and states she is following her eating plan approximately 90% of the time. Diana King states she is doing boot camp 45 minutes 4 times per week.  Today's visit was #: 7 Starting weight: 207 lbs Starting date: 02/09/2020 Today's weight: 191 lbs Today's date: 05/11/2020 Total lbs lost to date: 16 lbs Total lbs lost since last in-office visit: 2  Interim History: Last few weeks, Diana King had all in-laws in town for Easter. She ate more food the day prior to Easter due to overindulging and not feeling well. She has no upcoming plans for the next few weeks. No obstacles. She doesn't feel inclined to change meal plan.  Subjective:   1. Vitamin D deficiency Breindel denies nausea, vomiting, and muscle weakness but notes fatigue. Pt is on prescription Vit D.  2. Insulin resistance Diana King's last A1c was 5.5 and insulin level 13.5. She is on Metformin.  3. At risk for osteoporosis Diana King is at higher risk of osteopenia and osteoporosis due to Vitamin D deficiency.   Assessment/Plan:   1. Vitamin D deficiency Low Vitamin D level contributes to fatigue and are associated with obesity, breast, and colon cancer. She agrees to continue to take prescription Vitamin D @50 ,000 IU every week and will follow-up for routine testing of Vitamin D, at least 2-3 times per year to avoid over-replacement.  - Vitamin D, Ergocalciferol, (DRISDOL) 1.25 MG (50000 UNIT) CAPS capsule; Take 1 capsule (50,000 Units total) by mouth every 7 (seven) days.  Dispense: 4 capsule; Refill: 0  2. Insulin resistance Monserrate will continue to work on weight loss, exercise, and decreasing simple carbohydrates to help decrease the risk of diabetes. Sharian agreed to follow-up with Luanna Cole as directed to closely monitor her progress. Continue Metformin. No  refill needed today.  3. At risk for osteoporosis Diana King was given approximately 15 minutes of osteoporosis prevention counseling today. Diana King is at risk for osteopenia and osteoporosis due to her Vitamin D deficiency. She was encouraged to take her Vitamin D and follow her higher calcium diet and increase strengthening exercise to help strengthen her bones and decrease her risk of osteopenia and osteoporosis.  Repetitive spaced learning was employed today to elicit superior memory formation and behavioral change.  4. Class 1 obesity with serious comorbidity and body mass index (BMI) of 33.0 to 33.9 in adult, unspecified obesity type Diana King is currently in the action stage of change. As such, her goal is to continue with weight loss efforts. She has agreed to the Category 4 Plan.   Exercise goals: As is  Behavioral modification strategies: increasing lean protein intake, meal planning and cooking strategies and keeping healthy foods in the home.  Diana King has agreed to follow-up with our clinic in 3 weeks. She was informed of the importance of frequent follow-up visits to maximize her success with intensive lifestyle modifications for her multiple health conditions.   Objective:   Blood pressure 121/75, pulse 75, temperature 97.6 F (36.4 C), height 5\' 6"  (1.676 m), weight 191 lb (86.6 kg), SpO2 99 %. Body mass index is 30.83 kg/m.  General: Cooperative, alert, well developed, in no acute distress. HEENT: Conjunctivae and lids unremarkable. Cardiovascular: Regular rhythm.  Lungs: Normal work of breathing. Neurologic: No focal deficits.   Lab Results  Component Value Date   CREATININE 0.79 02/09/2020  BUN 11 02/09/2020   NA 139 02/09/2020   K 4.7 02/09/2020   CL 102 02/09/2020   CO2 20 02/09/2020   Lab Results  Component Value Date   ALT 13 02/09/2020   AST 11 02/09/2020   ALKPHOS 54 02/09/2020   BILITOT <0.2 02/09/2020   Lab Results  Component Value Date   HGBA1C 5.5  02/09/2020   Lab Results  Component Value Date   INSULIN 13.5 02/09/2020   Lab Results  Component Value Date   TSH 2.330 02/09/2020   Lab Results  Component Value Date   CHOL 206 (H) 02/09/2020   HDL 47 02/09/2020   LDLCALC 123 (H) 02/09/2020   TRIG 203 (H) 02/09/2020   CHOLHDL 3 04/01/2019   Lab Results  Component Value Date   WBC 9.9 02/09/2020   HGB 13.2 02/09/2020   HCT 40.6 02/09/2020   MCV 84 02/09/2020   PLT 428 02/09/2020    Attestation Statements:   Reviewed by clinician on day of visit: allergies, medications, problem list, medical history, surgical history, family history, social history, and previous encounter notes.  Edmund Hilda, am acting as transcriptionist for [name], MD.   I have reviewed the above documentation for accuracy and completeness, and I agree with the above. - Katherina Mires, MD

## 2020-05-31 ENCOUNTER — Other Ambulatory Visit (INDEPENDENT_AMBULATORY_CARE_PROVIDER_SITE_OTHER): Payer: Self-pay | Admitting: Family Medicine

## 2020-05-31 DIAGNOSIS — E8881 Metabolic syndrome: Secondary | ICD-10-CM

## 2020-06-06 ENCOUNTER — Ambulatory Visit (INDEPENDENT_AMBULATORY_CARE_PROVIDER_SITE_OTHER): Payer: 59 | Admitting: Family Medicine

## 2020-06-06 ENCOUNTER — Other Ambulatory Visit: Payer: Self-pay

## 2020-06-06 ENCOUNTER — Encounter (INDEPENDENT_AMBULATORY_CARE_PROVIDER_SITE_OTHER): Payer: Self-pay | Admitting: Family Medicine

## 2020-06-06 VITALS — BP 112/72 | HR 78 | Temp 97.5°F | Ht 66.0 in | Wt 187.0 lb

## 2020-06-06 DIAGNOSIS — E559 Vitamin D deficiency, unspecified: Secondary | ICD-10-CM | POA: Diagnosis not present

## 2020-06-06 DIAGNOSIS — Z9189 Other specified personal risk factors, not elsewhere classified: Secondary | ICD-10-CM | POA: Diagnosis not present

## 2020-06-06 DIAGNOSIS — Z6833 Body mass index (BMI) 33.0-33.9, adult: Secondary | ICD-10-CM | POA: Diagnosis not present

## 2020-06-06 DIAGNOSIS — E669 Obesity, unspecified: Secondary | ICD-10-CM | POA: Diagnosis not present

## 2020-06-06 DIAGNOSIS — E8881 Metabolic syndrome: Secondary | ICD-10-CM

## 2020-06-06 MED ORDER — VITAMIN D (ERGOCALCIFEROL) 1.25 MG (50000 UNIT) PO CAPS: 50000.0000 [IU] | ORAL_CAPSULE | ORAL | 0 refills | Status: DC

## 2020-06-06 MED ORDER — METFORMIN HCL 500 MG PO TABS: 500.0000 mg | ORAL_TABLET | Freq: Every day | ORAL | 0 refills | Status: DC

## 2020-06-07 ENCOUNTER — Other Ambulatory Visit (INDEPENDENT_AMBULATORY_CARE_PROVIDER_SITE_OTHER): Payer: Self-pay | Admitting: Family Medicine

## 2020-06-07 DIAGNOSIS — E559 Vitamin D deficiency, unspecified: Secondary | ICD-10-CM

## 2020-06-07 NOTE — Progress Notes (Signed)
Chief Complaint:   OBESITY Diana King is here to discuss her progress with her obesity treatment plan along with follow-up of her obesity related diagnoses. Diana King is on the Category 4 Plan and states she is following her eating plan approximately 80% of the time. Diana King states she is doing boot camp and HIIT 45 minutes 4 times per week.  Today's visit was #: 8 Starting weight: 207 lbs Starting date: 02/09/2020 Today's weight: 187 lbs Today's date: 06/06/2020 Total lbs lost to date: 20 Total lbs lost since last in-office visit: 4  Interim History: Diana King has been doing alright- hitting a mental block with meal plan. She is looking for simplicity and convenience. Pt mentions she's still exercising. Mental health wise, she's doing well. She is planning to go to [place] the upcoming weekend.  Subjective:   1. Vitamin D deficiency Diana King denies nausea, vomiting, and muscle weakness but notes fatigue. Pt is on prescription Vit D. She has a Vit D level of 25.5.  2. Insulin resistance Diana King is on Metformin. She denies GI side effects. Last A1c 5.5 and insulin level 13.5.  3. At risk for diabetes mellitus Diana King is at higher than average risk for developing diabetes due to obesity.   Assessment/Plan:   1. Vitamin D deficiency Low Vitamin D level contributes to fatigue and are associated with obesity, breast, and colon cancer. She agrees to continue to take prescription Vitamin D @50 ,000 IU every week and will follow-up for routine testing of Vitamin D, at least 2-3 times per year to avoid over-replacement. - Vitamin D, Ergocalciferol, (DRISDOL) 1.25 MG (50000 UNIT) CAPS capsule; Take 1 capsule (50,000 Units total) by mouth every 7 (seven) days.  Dispense: 4 capsule; Refill: 0  2. Insulin resistance Diana King will continue to work on weight loss, exercise, and decreasing simple carbohydrates to help decrease the risk of diabetes. Diana King agreed to follow-up with Diana King as directed to closely monitor  her progress. - metFORMIN (GLUCOPHAGE) 500 MG tablet; Take 1 tablet (500 mg total) by mouth daily with breakfast.  Dispense: 90 tablet; Refill: 0  3. At risk for diabetes mellitus Diana King was given approximately 15 minutes of diabetes education and counseling today. We discussed intensive lifestyle modifications today with an emphasis on weight loss as well as increasing exercise and decreasing simple carbohydrates in her diet. We also reviewed medication options with an emphasis on risk versus benefit of those discussed.   Repetitive spaced learning was employed today to elicit superior memory formation and behavioral change.  4. Class 1 obesity with serious comorbidity and body mass index (BMI) of 33.0 to 33.9 in adult, unspecified obesity type  Diana King is currently in the action stage of change. As such, her goal is to continue with weight loss efforts. She has agreed to the Category 4 Plan.   Exercise goals: As is  Behavioral modification strategies: increasing lean protein intake, meal planning and cooking strategies, keeping healthy foods in the home and planning for success.  Diana King has agreed to follow-up with our clinic in 3 weeks. She was informed of the importance of frequent follow-up visits to maximize her success with intensive lifestyle modifications for her multiple health conditions.   Objective:   Blood pressure 112/72, pulse 78, temperature (!) 97.5 F (36.4 C), height 5\' 6"  (1.676 m), weight 187 lb (84.8 kg), SpO2 98 %. Body mass index is 30.18 kg/m.  General: Cooperative, alert, well developed, in no acute distress. HEENT: Conjunctivae and lids unremarkable. Cardiovascular: Regular rhythm.  Lungs: Normal work of breathing. Neurologic: No focal deficits.   Lab Results  Component Value Date   CREATININE 0.79 02/09/2020   BUN 11 02/09/2020   NA 139 02/09/2020   K 4.7 02/09/2020   CL 102 02/09/2020   CO2 20 02/09/2020   Lab Results  Component Value Date   ALT  13 02/09/2020   AST 11 02/09/2020   ALKPHOS 54 02/09/2020   BILITOT <0.2 02/09/2020   Lab Results  Component Value Date   HGBA1C 5.5 02/09/2020   Lab Results  Component Value Date   INSULIN 13.5 02/09/2020   Lab Results  Component Value Date   TSH 2.330 02/09/2020   Lab Results  Component Value Date   CHOL 206 (H) 02/09/2020   HDL 47 02/09/2020   LDLCALC 123 (H) 02/09/2020   TRIG 203 (H) 02/09/2020   CHOLHDL 3 04/01/2019   Lab Results  Component Value Date   WBC 9.9 02/09/2020   HGB 13.2 02/09/2020   HCT 40.6 02/09/2020   MCV 84 02/09/2020   PLT 428 02/09/2020   No results found for: IRON, TIBC, FERRITIN   Attestation Statements:   Reviewed by clinician on day of visit: allergies, medications, problem list, medical history, surgical history, family history, social history, and previous encounter notes.  Edmund Hilda, CMA, am acting as transcriptionist for [name], MD.   I have reviewed the above documentation for accuracy and completeness, and I agree with the above. - Katherina Mires, MD

## 2020-06-08 ENCOUNTER — Encounter (INDEPENDENT_AMBULATORY_CARE_PROVIDER_SITE_OTHER): Payer: Self-pay | Admitting: Family Medicine

## 2020-06-08 NOTE — Telephone Encounter (Signed)
DR Ukleja 

## 2020-06-26 ENCOUNTER — Other Ambulatory Visit: Payer: Self-pay | Admitting: Internal Medicine

## 2020-07-07 ENCOUNTER — Other Ambulatory Visit (INDEPENDENT_AMBULATORY_CARE_PROVIDER_SITE_OTHER): Payer: Self-pay | Admitting: Family Medicine

## 2020-07-07 DIAGNOSIS — E559 Vitamin D deficiency, unspecified: Secondary | ICD-10-CM

## 2020-07-10 ENCOUNTER — Encounter (INDEPENDENT_AMBULATORY_CARE_PROVIDER_SITE_OTHER): Payer: Self-pay | Admitting: Family Medicine

## 2020-07-10 ENCOUNTER — Other Ambulatory Visit: Payer: Self-pay

## 2020-07-10 ENCOUNTER — Ambulatory Visit (INDEPENDENT_AMBULATORY_CARE_PROVIDER_SITE_OTHER): Payer: 59 | Admitting: Family Medicine

## 2020-07-10 VITALS — BP 113/73 | HR 76 | Temp 98.5°F | Ht 66.0 in | Wt 185.0 lb

## 2020-07-10 DIAGNOSIS — Z9189 Other specified personal risk factors, not elsewhere classified: Secondary | ICD-10-CM

## 2020-07-10 DIAGNOSIS — E559 Vitamin D deficiency, unspecified: Secondary | ICD-10-CM

## 2020-07-10 DIAGNOSIS — E669 Obesity, unspecified: Secondary | ICD-10-CM | POA: Diagnosis not present

## 2020-07-10 DIAGNOSIS — Z6833 Body mass index (BMI) 33.0-33.9, adult: Secondary | ICD-10-CM

## 2020-07-10 DIAGNOSIS — E8881 Metabolic syndrome: Secondary | ICD-10-CM | POA: Diagnosis not present

## 2020-07-10 DIAGNOSIS — E7849 Other hyperlipidemia: Secondary | ICD-10-CM

## 2020-07-10 MED ORDER — VITAMIN D (ERGOCALCIFEROL) 1.25 MG (50000 UNIT) PO CAPS: 50000.0000 [IU] | ORAL_CAPSULE | ORAL | 0 refills | Status: DC

## 2020-07-10 NOTE — Telephone Encounter (Signed)
Dr.Ukleja 

## 2020-07-11 LAB — LIPID PANEL WITH LDL/HDL RATIO
Cholesterol, Total: 173 mg/dL (ref 100–199)
HDL: 56 mg/dL (ref >39)
LDL Chol Calc (NIH): 97 mg/dL (ref 0–99)
LDL/HDL Ratio: 1.7 ratio (ref 0.0–3.2)
Triglycerides: 110 mg/dL (ref 0–149)
VLDL Cholesterol Cal: 20 mg/dL (ref 5–40)

## 2020-07-11 LAB — VITAMIN D 25 HYDROXY (VIT D DEFICIENCY, FRACTURES): Vit D, 25-Hydroxy: 52.2 ng/mL (ref 30.0–100.0)

## 2020-07-11 LAB — INSULIN, RANDOM: INSULIN: 13.4 u[IU]/mL (ref 2.6–24.9)

## 2020-07-11 LAB — HEMOGLOBIN A1C
Est. average glucose Bld gHb Est-mCnc: 114 mg/dL
Hgb A1c MFr Bld: 5.6 % (ref 4.8–5.6)

## 2020-07-11 NOTE — Progress Notes (Signed)
Chief Complaint:   OBESITY Diana King is here to discuss her progress with her obesity treatment plan along with follow-up of her obesity related diagnoses. Diana King is on the Category 4 Plan and states she is following her eating plan approximately 60% of the time. Diana King states she is doing [employer] 40 minutes 4 times per week.  Today's visit was #: 9 Starting weight: 207 lbs Starting date: 02/09/2020 Today's weight: 185 lbs Today's date: 07/10/2020 Total lbs lost to date: 22 Total lbs lost since last in-office visit: 2  Interim History: Diana King has had a lot going on since her last appointment. She went on vacation to [place] and both she and her daughter got sick. She found she was able to eat while sick. She thinks she followed the plan better than she thought she did.  Subjective:   1. Vitamin D deficiency Diana King denies nausea, vomiting, and muscle weakness but notes fatigue. She is on prescription Vit D.  2. Insulin resistance Diana King stopped Metformin due to bloating. Her last A1c was 5.5 and insulin level 13.5.  3. Other hyperlipidemia Diana King's last LDL was 123, HDL 47, and triglycerides 308. She is not on statin therapy.  4. At risk for osteoporosis Diana King is at higher risk of osteopenia and osteoporosis due to Vitamin D deficiency.   Assessment/Plan:   1. Vitamin D deficiency Low Vitamin D level contributes to fatigue and are associated with obesity, breast, and colon cancer. She agrees to continue to take prescription Vitamin D @50 ,000 IU every week and will follow-up for routine testing of Vitamin D, at least 2-3 times per year to avoid over-replacement. Check labs today.   - Vitamin D, Ergocalciferol, (DRISDOL) 1.25 MG (50000 UNIT) CAPS capsule; Take 1 capsule (50,000 Units total) by mouth every 7 (seven) days.  Dispense: 4 capsule; Refill: 0 - VITAMIN D 25 Hydroxy (Vit-D Deficiency, Fractures)  2. Insulin resistance Diana King will continue to work on weight  loss, exercise, and decreasing simple carbohydrates to help decrease the risk of diabetes. Diana King agreed to follow-up with Diana King as directed to closely monitor her progress. Check labs today.  - Hemoglobin A1c - Insulin, random  3. Other hyperlipidemia Cardiovascular risk and specific lipid/LDL goals reviewed.  We discussed several lifestyle modifications today and Diana King will continue to work on diet, exercise and weight loss efforts. Orders and follow up as documented in patient record.  Check labs today.  Counseling Intensive lifestyle modifications are the first line treatment for this issue. Dietary changes: Increase soluble fiber. Decrease simple carbohydrates. Exercise changes: Moderate to vigorous-intensity aerobic activity 150 minutes per week if tolerated. Lipid-lowering medications: see documented in medical record.  - Lipid Panel With LDL/HDL Ratio  4. At risk for osteoporosis Diana King was given approximately 15 minutes of osteoporosis prevention counseling today. Diana King is at risk for osteopenia and osteoporosis due to her Vitamin D deficiency. She was encouraged to take her Vitamin D and follow her higher calcium diet and increase strengthening exercise to help strengthen her bones and decrease her risk of osteopenia and osteoporosis.  Repetitive spaced learning was employed today to elicit superior memory formation and behavioral change.  5. Class 1 obesity with serious comorbidity and body mass index (BMI) of 33.0 to 33.9 in adult, unspecified obesity type  Diana King is currently in the action stage of change. As such, her goal is to continue with weight loss efforts. She has agreed to the Category 4 Plan.   Exercise goals:  As  is  Behavioral modification strategies: increasing lean protein intake, meal planning and cooking strategies, keeping healthy foods in the home, and planning for success.  Diana King has agreed to follow-up with our clinic in 3 weeks. She was informed of the  importance of frequent follow-up visits to maximize her success with intensive lifestyle modifications for her multiple health conditions.   Diana King was informed we would discuss her lab results at her next visit unless there is a critical issue that needs to be addressed sooner. Diana King agreed to keep her next visit at the agreed upon time to discuss these results.  Objective:   Blood pressure 113/73, pulse 76, temperature 98.5 F (36.9 C), height 5\' 6"  (1.676 m), weight 185 lb (83.9 kg), SpO2 96 %. Body mass index is 29.86 kg/m.  General: Cooperative, alert, well developed, in no acute distress. HEENT: Conjunctivae and lids unremarkable. Cardiovascular: Regular rhythm.  Lungs: Normal work of breathing. Neurologic: No focal deficits.   Lab Results  Component Value Date   CREATININE 0.79 02/09/2020   BUN 11 02/09/2020   NA 139 02/09/2020   K 4.7 02/09/2020   CL 102 02/09/2020   CO2 20 02/09/2020   Lab Results  Component Value Date   ALT 13 02/09/2020   AST 11 02/09/2020   ALKPHOS 54 02/09/2020   BILITOT <0.2 02/09/2020   Lab Results  Component Value Date   HGBA1C 5.6 07/10/2020   HGBA1C 5.5 02/09/2020   Lab Results  Component Value Date   INSULIN 13.4 07/10/2020   INSULIN 13.5 02/09/2020   Lab Results  Component Value Date   TSH 2.330 02/09/2020   Lab Results  Component Value Date   CHOL 173 07/10/2020   HDL 56 07/10/2020   LDLCALC 97 07/10/2020   TRIG 110 07/10/2020   CHOLHDL 3 04/01/2019   Lab Results  Component Value Date   WBC 9.9 02/09/2020   HGB 13.2 02/09/2020   HCT 40.6 02/09/2020   MCV 84 02/09/2020   PLT 428 02/09/2020   No results found for: IRON, TIBC, FERRITIN   Attestation Statements:   Reviewed by clinician on day of visit: allergies, medications, problem list, medical history, surgical history, family history, social history, and previous encounter notes.  02/11/2020, CMA, am acting as transcriptionist for Edmund Hilda,  MD.   I have reviewed the above documentation for accuracy and completeness, and I agree with the above. - Diana Likes, MD

## 2020-07-14 IMAGING — DX DG LUMBAR SPINE 2-3V
3 series · 3 of 3 positions shown · non-contrast
Comparison: None.

CLINICAL DATA: Lumbago

EXAM:
LUMBAR SPINE - 2-3 VIEW

[l-spine ap]
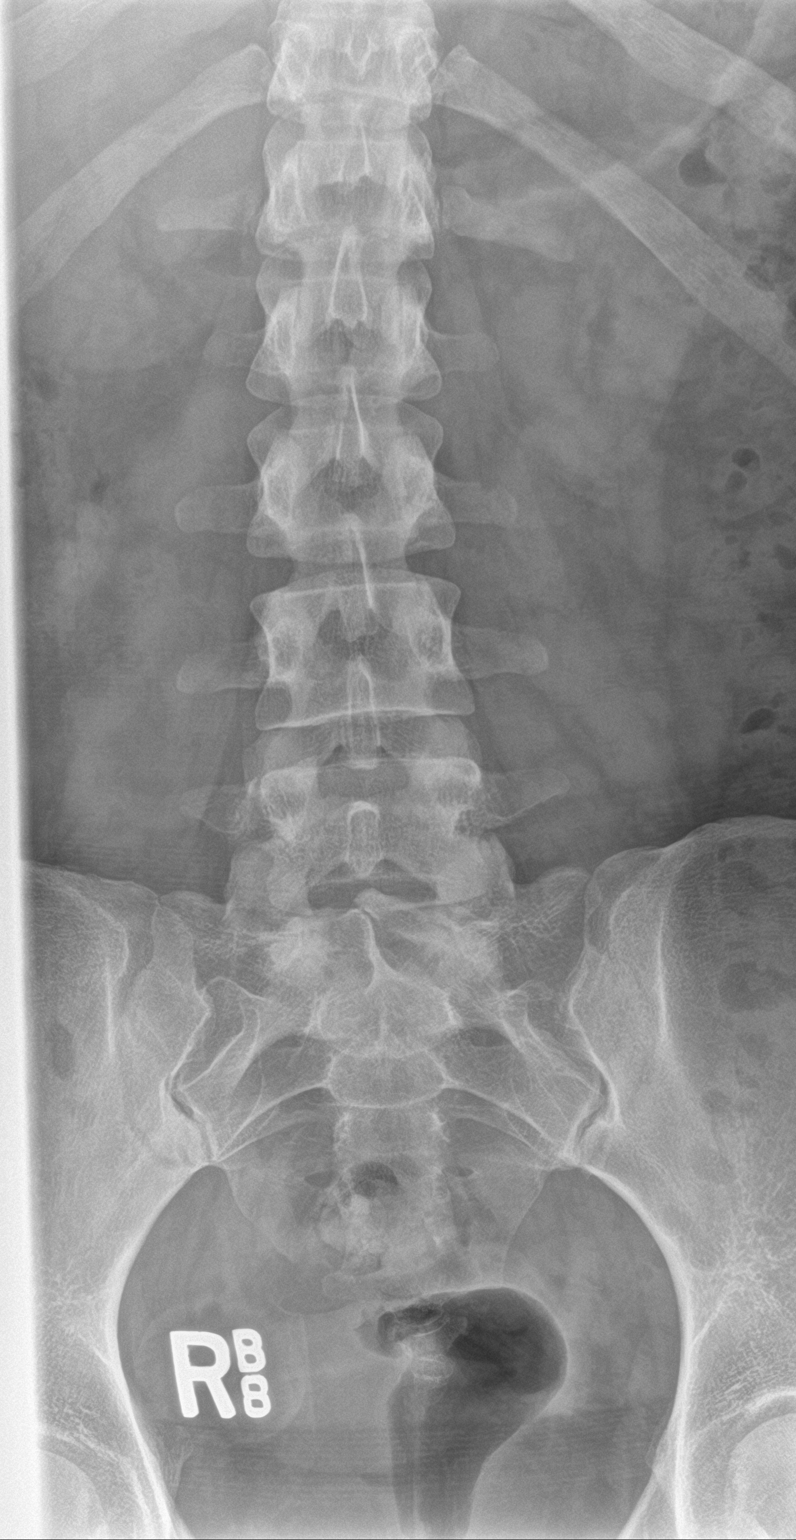

[l-spine lat]
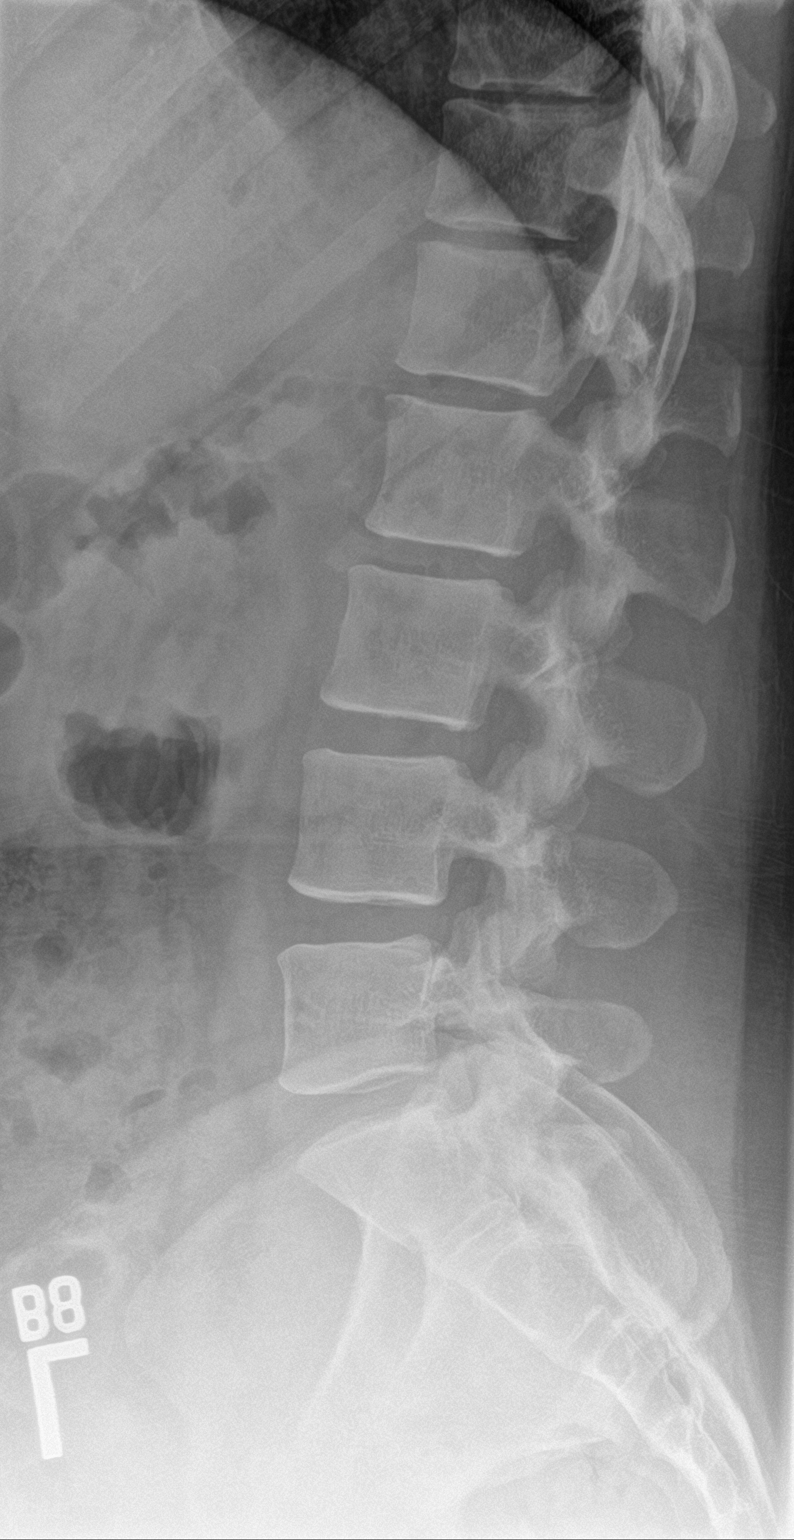

[l-spine spot]
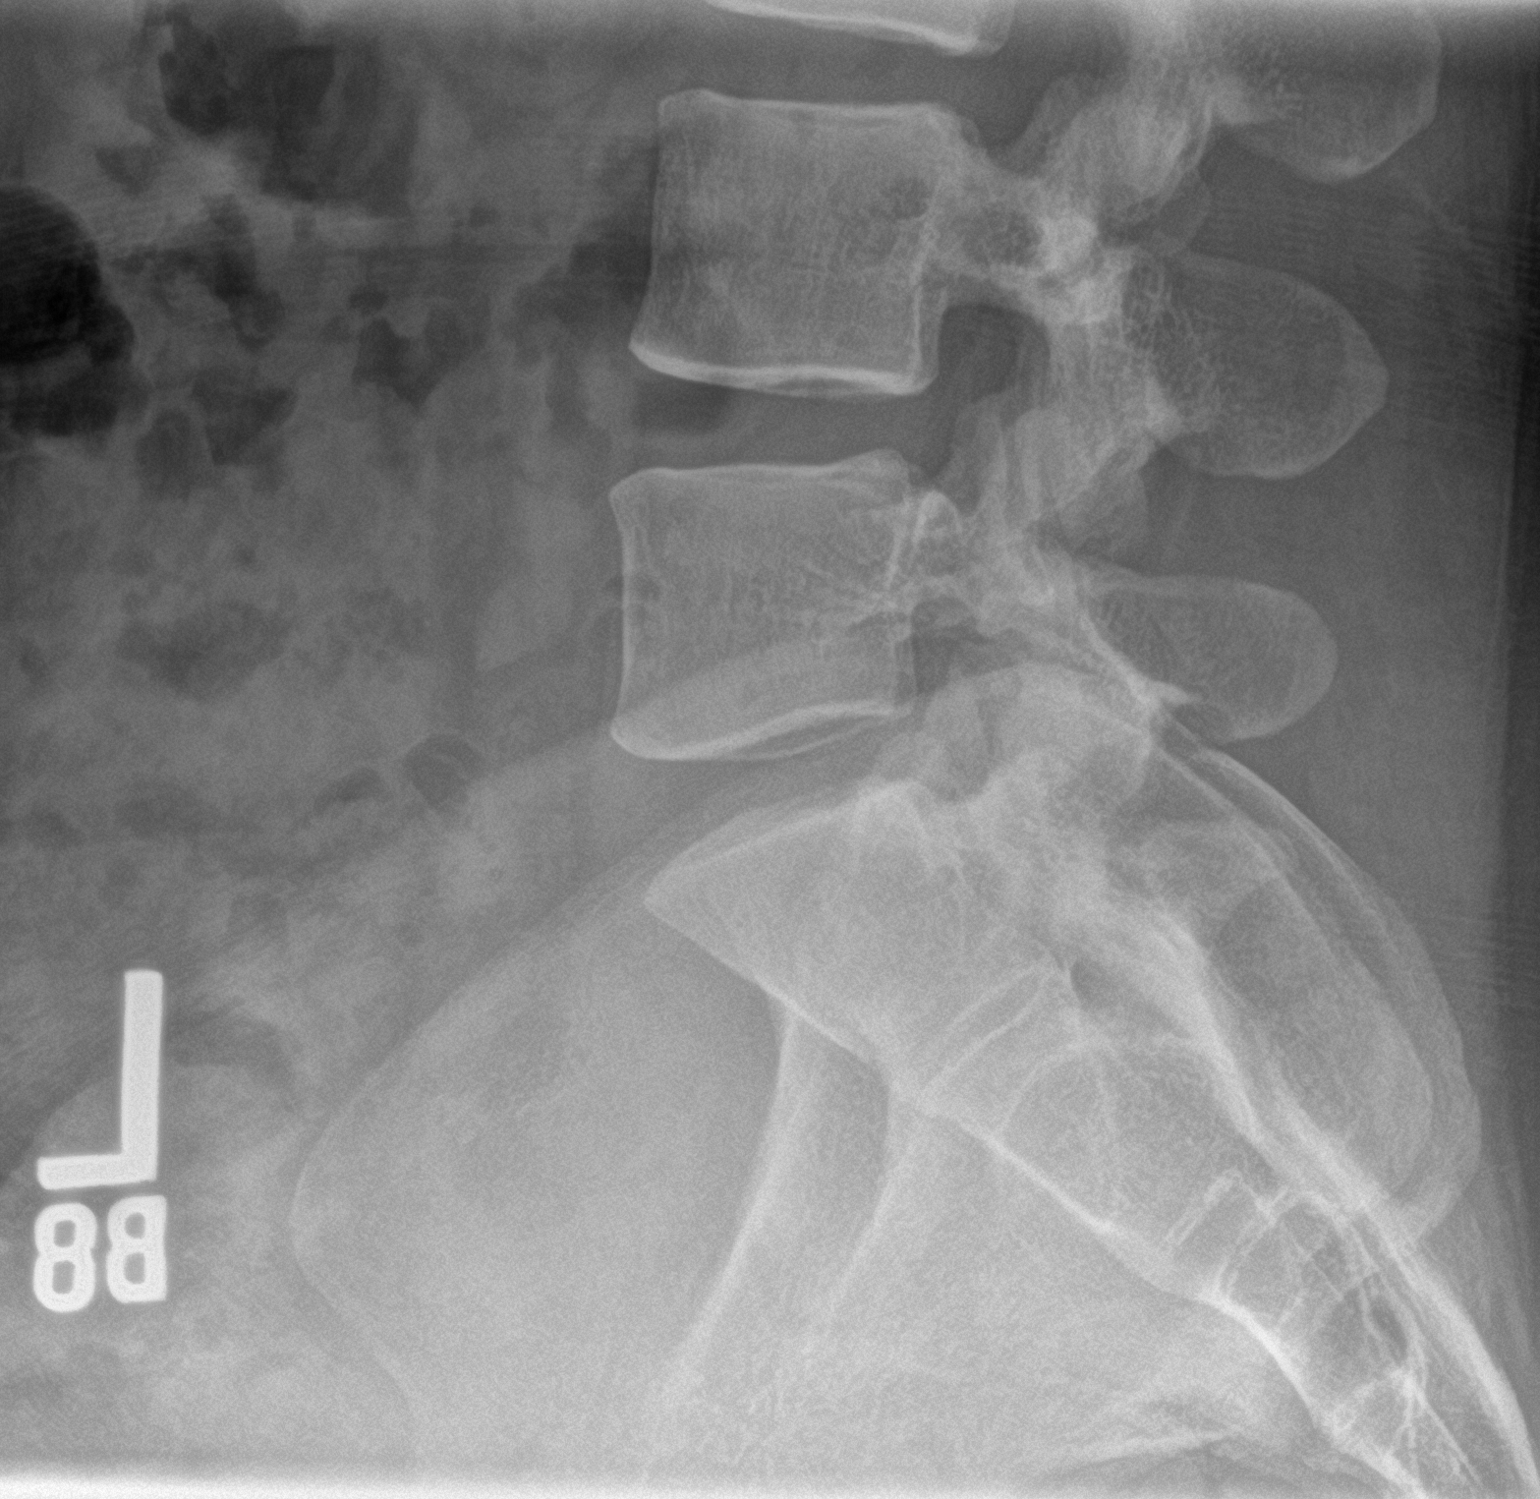

[3 of 3 positions shown; findings below may reference images not displayed]

FINDINGS: Frontal, lateral, and spot lumbosacral lateral images were obtained.
There are 4 strictly non-rib-bearing lumbar type vertebral bodies.
There is mild elongation of the L1 transverse processes bilaterally.
There is slight lower lumbar levoscoliosis. There is no fracture or
spondylolisthesis. The disc spaces appear normal. No appreciable
facet arthropathy.
IMPRESSION: Slight scoliosis. No fracture or spondylolisthesis. No appreciable
arthropathy.

## 2020-07-14 NOTE — Progress Notes (Signed)
[name] [address] [place] [address] Phone: [phone] Subjective:   I Diana King am serving as a Neurosurgeon for Dr. Antoine Primas.  This visit occurred during the SARS-CoV-2 public health emergency.  Safety protocols were in place, including screening questions prior to the visit, additional usage of staff PPE, and extensive cleaning of exam room while observing appropriate contact time as indicated for disinfecting solutions.   I'm seeing this patient by the request  of:  Pincus Sanes, MD  CC: Back pain follow-up  [id number]  Diana King is a 30 y.o. female coming in with complaint of back and neck pain. OMT 11/17/2019. Patient states she has been having low back pain mostly on the left side, mid back pain on the spine, and upper right shoulder pain (shoulder blade) going on 2 weeks. It is worse when she sits or lays down. Laying down is the worst. Only took Tizanidine last night which helped but she still woke up intermittently with pain. When she lays on her right side she lays with her arm up under the pillow and her head on the pillow on her arm. She has used heat to help at night as well.  Patient though has not been working out as regularly.  Taking care of her husband to recently had surgery.  Medications patient has been prescribed:   Taking:         Reviewed prior external information including notes and imaging from previsou exam, outside providers and external EMR if available.   As well as notes that were available from care everywhere and other healthcare systems.  Past medical history, social, surgical and family history all reviewed in electronic medical record.  No pertanent information unless stated regarding to the chief complaint.   Past Medical History:  Diagnosis Date   Acne    Anxiety    Back pain    Depression    GERD (gastroesophageal reflux disease)    IBS (irritable bowel syndrome)     Overweight    Pain in Achilles tendon    Palpitations     Allergies  Allergen Reactions   Latex Swelling     Review of Systems:  No headache, visual changes, nausea, vomiting, diarrhea, constipation, dizziness, abdominal pain, skin rash, fevers, chills, night sweats, weight loss, swollen lymph nodes, body aches, joint swelling, chest pain, shortness of breath, mood changes. POSITIVE muscle aches  Objective  Blood pressure 132/82, pulse 78, height 5\' 6"  (1.676 m), weight 187 lb (84.8 kg), SpO2 92 %.   General: No apparent distress alert and oriented x3 mood and affect normal, dressed appropriately.  HEENT: Pupils equal, extraocular movements intact  Respiratory: Patient's speak in full sentences and does not appear short of breath  Cardiovascular: No lower extremity edema, non tender, no erythema  Mild loss of lordosis of the lumbar spine.  Tenderness to palpation minorly over the sacroiliac joints bilaterally.  Negative straight leg test.  Mild discomfort with FABER test.  Osteopathic findings  L2 flexed rotated and side bent right Sacrum right on right     Assessment and Plan:  Low back pain Chronic problem with mild exacerbation.  Does have Zanaflex for the breakthrough pain.  Discussed icing regimen and home exercises.  Increase activity as tolerated.  Encourage patient to continue to work on the weight loss with patient is done remarkably well at the moment.  Follow-up with me again 6 weeks.   Nonallopathic problems  Decision today to treat with OMT was based on Physical Exam  After verbal consent patient was treated with HVLA, ME, FPR techniques in  lumbar, and sacral  areas  Patient tolerated the procedure well with improvement in symptoms  Patient given exercises, stretches and lifestyle modifications  See medications in patient instructions if given  Patient will follow up in 4-8 weeks      The above documentation has been reviewed and is accurate and  complete Diana Saa, DO       Note: This dictation was prepared with Dragon dictation along with smaller phrase technology. Any transcriptional errors that result from this process are unintentional.

## 2020-07-17 ENCOUNTER — Ambulatory Visit (INDEPENDENT_AMBULATORY_CARE_PROVIDER_SITE_OTHER): Payer: 59 | Admitting: Family Medicine

## 2020-07-17 ENCOUNTER — Other Ambulatory Visit: Payer: Self-pay

## 2020-07-17 ENCOUNTER — Encounter: Payer: Self-pay | Admitting: Family Medicine

## 2020-07-17 VITALS — BP 132/82 | HR 78 | Ht 66.0 in | Wt 187.0 lb

## 2020-07-17 DIAGNOSIS — M9904 Segmental and somatic dysfunction of sacral region: Secondary | ICD-10-CM

## 2020-07-17 DIAGNOSIS — M545 Low back pain, unspecified: Secondary | ICD-10-CM

## 2020-07-17 DIAGNOSIS — M9903 Segmental and somatic dysfunction of lumbar region: Secondary | ICD-10-CM

## 2020-07-17 MED ORDER — TIZANIDINE HCL 4 MG PO TABS: 4.0000 mg | ORAL_TABLET | Freq: Four times a day (QID) | ORAL | 1 refills | Status: DC | PRN

## 2020-07-17 MED ORDER — FLUCONAZOLE 200 MG PO TABS: 200.0000 mg | ORAL_TABLET | Freq: Every day | ORAL | 0 refills | Status: DC

## 2020-07-17 NOTE — Addendum Note (Signed)
Addended by: Ronelle Nigh on: 07/17/2020 02:34 PM   Modules accepted: Orders

## 2020-07-17 NOTE — Assessment & Plan Note (Signed)
Chronic problem with mild exacerbation.  Does have Zanaflex for the breakthrough pain.  Discussed icing regimen and home exercises.  Increase activity as tolerated.  Encourage patient to continue to work on the weight loss with patient is done remarkably well at the moment.  Follow-up with me again 6 weeks.

## 2020-07-31 ENCOUNTER — Other Ambulatory Visit (INDEPENDENT_AMBULATORY_CARE_PROVIDER_SITE_OTHER): Payer: Self-pay | Admitting: Family Medicine

## 2020-07-31 ENCOUNTER — Encounter (INDEPENDENT_AMBULATORY_CARE_PROVIDER_SITE_OTHER): Payer: Self-pay | Admitting: Family Medicine

## 2020-07-31 ENCOUNTER — Telehealth (INDEPENDENT_AMBULATORY_CARE_PROVIDER_SITE_OTHER): Payer: Self-pay

## 2020-07-31 ENCOUNTER — Other Ambulatory Visit: Payer: Self-pay

## 2020-07-31 ENCOUNTER — Ambulatory Visit (INDEPENDENT_AMBULATORY_CARE_PROVIDER_SITE_OTHER): Payer: 59 | Admitting: Family Medicine

## 2020-07-31 VITALS — BP 114/75 | HR 79 | Temp 98.5°F | Ht 66.0 in | Wt 184.0 lb

## 2020-07-31 DIAGNOSIS — Z9189 Other specified personal risk factors, not elsewhere classified: Secondary | ICD-10-CM

## 2020-07-31 DIAGNOSIS — E669 Obesity, unspecified: Secondary | ICD-10-CM | POA: Diagnosis not present

## 2020-07-31 DIAGNOSIS — E8881 Metabolic syndrome: Secondary | ICD-10-CM | POA: Diagnosis not present

## 2020-07-31 DIAGNOSIS — F419 Anxiety disorder, unspecified: Secondary | ICD-10-CM

## 2020-07-31 DIAGNOSIS — F32A Depression, unspecified: Secondary | ICD-10-CM | POA: Diagnosis not present

## 2020-07-31 DIAGNOSIS — Z6833 Body mass index (BMI) 33.0-33.9, adult: Secondary | ICD-10-CM

## 2020-07-31 MED ORDER — OZEMPIC (0.25 OR 0.5 MG/DOSE) 2 MG/1.5ML ~~LOC~~ SOPN: 0.2500 mg | PEN_INJECTOR | SUBCUTANEOUS | 0 refills | Status: DC

## 2020-07-31 NOTE — Telephone Encounter (Signed)
Last OV with Dr Ukleja 

## 2020-07-31 NOTE — Telephone Encounter (Signed)
PA has been initiated via [email or web address] for Ozempic 2mg /1.74mL once weekly injection.  Key: [id number] Ozempic (0.25 or 0.5 MG/DOSE) 2MG /1.5ML pen-injectors   Form: 4m PA Form (2017 NCPDP) Determination: Wait for Determination Please wait for [employer] ESI 2017 to return a determination  Your information has been submitted and will be reviewed by 11-10-2004. You may close this dialog, return to your dashboard, and perform other tasks. An electronic determination will be received in CoverMyMeds within 72-120 hours. You can see the latest determination by locating this request on your dashboard or by reopening this request. You will receive a fax copy of the determination. If 2018 has not responded in 120 hours, contact [employer] at [phone].

## 2020-08-02 NOTE — Progress Notes (Signed)
Chief Complaint:   OBESITY Diana King is here to discuss her progress with her obesity treatment plan along with follow-up of her obesity related diagnoses. Diana King is on the Category 4 Plan and states she is following her eating plan approximately 65% of the time. Diana King states she is doing HIIT 45 minutes 4 times per week.  Today's visit was #: 10 Starting weight: 207 lbs Starting date: 02/09/2020 Today's weight: 184 lbs Today's date: 07/31/2020 Total lbs lost to date: 23 Total lbs lost since last in-office visit: 1  Interim History: Summers had a low key July 4th. She has been feeling more depressed recently, dealing with some apathy. She just went grocery shopping and meal prepped. Pt is feeling frustrated with A1c result. She is going to try to be more conscious of indulgent choices.  Subjective:   1. Anxiety and depression Diana King is on Wellbutrin 300 mg and Zoloft 50 mg.   2. Metabolic syndrome Elevated LDL and elevated waist circumference. Pt is working on intensive lifestyle changes.  3. At risk for side effect of medication Diana King is at risk for side effects of medication due to starting GLP-1.  Assessment/Plan:   1. Anxiety and depression Behavior modification techniques were discussed today to help Diana King deal with her anxiety.  Orders and follow up as documented in patient record.  Increase Zoloft to 75 mg daily.  2. Metabolic syndrome Start Ozempic 0.25 mg weekly, as prescribed below.  Start- Semaglutide,0.25 or 0.5MG /DOS, (OZEMPIC, 0.25 OR 0.5 MG/DOSE,) 2 MG/1.5ML SOPN; Inject 0.25 mg into the skin once a week.  Dispense: 1.5 mL; Refill: 0  3. At risk for side effect of medication Diana King was given approximately 15 minutes of drug side effect counseling today.  We discussed side effect possibility and risk versus benefits. Diana King agreed to the medication and will contact this office if these side effects are intolerable.  Repetitive spaced learning was employed  today to elicit superior memory formation and behavioral change.   4. Current BMI 29.8  Diana King is currently in the action stage of change. As such, her goal is to continue with weight loss efforts. She has agreed to the Category 4 Plan.   Exercise goals:  As is  Behavioral modification strategies: increasing lean protein intake, meal planning and cooking strategies, keeping healthy foods in the home, and planning for success.  Diana King has agreed to follow-up with our clinic in 3 weeks. She was informed of the importance of frequent follow-up visits to maximize her success with intensive lifestyle modifications for her multiple health conditions.   Objective:   Blood pressure 114/75, pulse 79, temperature 98.5 F (36.9 C), height 5\' 6"  (1.676 m), weight 184 lb (83.5 kg), SpO2 97 %. Body mass index is 29.7 kg/m.  General: Cooperative, alert, well developed, in no acute distress. HEENT: Conjunctivae and lids unremarkable. Cardiovascular: Regular rhythm.  Lungs: Normal work of breathing. Neurologic: No focal deficits.   Lab Results  Component Value Date   CREATININE 0.79 02/09/2020   BUN 11 02/09/2020   NA 139 02/09/2020   K 4.7 02/09/2020   CL 102 02/09/2020   CO2 20 02/09/2020   Lab Results  Component Value Date   ALT 13 02/09/2020   AST 11 02/09/2020   ALKPHOS 54 02/09/2020   BILITOT <0.2 02/09/2020   Lab Results  Component Value Date   HGBA1C 5.6 07/10/2020   HGBA1C 5.5 02/09/2020   Lab Results  Component Value Date   INSULIN 13.4 07/10/2020  INSULIN 13.5 02/09/2020   Lab Results  Component Value Date   TSH 2.330 02/09/2020   Lab Results  Component Value Date   CHOL 173 07/10/2020   HDL 56 07/10/2020   LDLCALC 97 07/10/2020   TRIG 110 07/10/2020   CHOLHDL 3 04/01/2019   Lab Results  Component Value Date   VD25OH 52.2 07/10/2020   VD25OH 25.5 (L) 02/09/2020   Lab Results  Component Value Date   WBC 9.9 02/09/2020   HGB 13.2 02/09/2020   HCT 40.6  02/09/2020   MCV 84 02/09/2020   PLT 428 02/09/2020    Attestation Statements:   Reviewed by clinician on day of visit: allergies, medications, problem list, medical history, surgical history, family history, social history, and previous encounter notes.  Edmund Hilda, CMA, am acting as transcriptionist for [name], MD.   I have reviewed the above documentation for accuracy and completeness, and I agree with the above. - [name], MD

## 2020-08-07 ENCOUNTER — Encounter (INDEPENDENT_AMBULATORY_CARE_PROVIDER_SITE_OTHER): Payer: Self-pay | Admitting: Family Medicine

## 2020-08-22 ENCOUNTER — Encounter (INDEPENDENT_AMBULATORY_CARE_PROVIDER_SITE_OTHER): Payer: Self-pay | Admitting: Family Medicine

## 2020-08-22 ENCOUNTER — Ambulatory Visit (INDEPENDENT_AMBULATORY_CARE_PROVIDER_SITE_OTHER): Payer: 59 | Admitting: Family Medicine

## 2020-08-22 ENCOUNTER — Other Ambulatory Visit: Payer: Self-pay

## 2020-08-22 VITALS — BP 114/79 | HR 97 | Temp 98.3°F | Ht 66.0 in | Wt 184.0 lb

## 2020-08-22 DIAGNOSIS — Z9189 Other specified personal risk factors, not elsewhere classified: Secondary | ICD-10-CM | POA: Diagnosis not present

## 2020-08-22 DIAGNOSIS — E559 Vitamin D deficiency, unspecified: Secondary | ICD-10-CM

## 2020-08-22 DIAGNOSIS — R632 Polyphagia: Secondary | ICD-10-CM

## 2020-08-22 DIAGNOSIS — Z6833 Body mass index (BMI) 33.0-33.9, adult: Secondary | ICD-10-CM | POA: Diagnosis not present

## 2020-08-22 DIAGNOSIS — E669 Obesity, unspecified: Secondary | ICD-10-CM | POA: Diagnosis not present

## 2020-08-22 MED ORDER — TIRZEPATIDE 2.5 MG/0.5ML ~~LOC~~ SOAJ: 2.5000 mg | SUBCUTANEOUS | 0 refills | Status: DC

## 2020-08-23 NOTE — Progress Notes (Signed)
Chief Complaint:   OBESITY Diana King is here to discuss her progress with her obesity treatment plan along with follow-up of her obesity related diagnoses. Diana King is on the Category 4 Plan and states she is following her eating plan approximately 70% of the time. Diana King states she is doing HIIT [place] 45 minutes 4 times per week.  Today's visit was #: 11 Starting weight: 207 lbs Starting date: 02/09/2020 Today's weight: 184 lbs Today's date: 08/22/2020 Total lbs lost to date: 23 Total lbs lost since last in-office visit: 0  Interim History: Ozempic was not approved by insurance, but [place] restarted Metformin. She has been going through a difficult time with her daughter getting older. She is doing quite a bit of morning star and cauliflower substitutions. She wants to get back up to 80-90% compliance.  Subjective:   1. Vitamin D deficiency Diana King's last Vit D level was 52.2. She reports fatigue and is taking OTC Vit D 5,000 IU daily.  2. Polyphagia Ozempic was denied. Diana King is on oral contraception. She reports increased food intake recently.  3. At risk for side effect of medication Diana King is at risk for side effects of medication due to starting [facility].  Assessment/Plan:   1. Vitamin D deficiency Low Vitamin D level contributes to fatigue and are associated with obesity, breast, and colon cancer. She agrees to continue to take OTC Vitamin D 5,000 IU daily and will follow-up for routine testing of Vitamin D, at least 2-3 times per year to avoid over-replacement.  2. Polyphagia Diana King will start Mounjaro 2.5 mg. Back up barrier contraception discussed. Intensive lifestyle modifications are the first line treatment for this issue. We discussed several lifestyle modifications today and she will continue to work on diet, exercise and weight loss efforts. Orders and follow up as documented in patient record.  Counseling Polyphagia is excessive hunger. Causes can include: low  blood sugars, hypERthyroidism, PMS, lack of sleep, stress, insulin resistance, diabetes, certain medications, and diets that are deficient in protein and fiber.   Start- tirzepatide [facility]) 2.5 MG/0.5ML Pen; Inject 2.5 mg into the skin once a week. Patient has copay card  Dispense: 2 mL; Refill: 0  3. At risk for side effect of medication Diana King was given approximately 15 minutes of drug side effect counseling today.  We discussed side effect possibility and risk versus benefits. Diana King agreed to the medication and will contact this office if these side effects are intolerable.  Repetitive spaced learning was employed today to elicit superior memory formation and behavioral change.   4. Obesity with current BMI of 29.8  Diana King is currently in the action stage of change. As such, her goal is to continue with weight loss efforts. She has agreed to the Category 4 Plan.   Exercise goals:  As is  Behavioral modification strategies: increasing lean protein intake, meal planning and cooking strategies, keeping healthy foods in the home, and planning for success.  Diana King has agreed to follow-up with our clinic in 3 weeks. She was informed of the importance of frequent follow-up visits to maximize her success with intensive lifestyle modifications for her multiple health conditions.   Objective:   Blood pressure 114/79, pulse 97, temperature 98.3 F (36.8 C), height 5\' 6"  (1.676 m), weight 184 lb (83.5 kg), last menstrual period 08/13/2020, SpO2 97 %. Body mass index is 29.7 kg/m.  General: Cooperative, alert, well developed, in no acute distress. HEENT: Conjunctivae and lids unremarkable. Cardiovascular: Regular rhythm.  Lungs: Normal  work of breathing. Neurologic: No focal deficits.   Lab Results  Component Value Date   CREATININE 0.79 02/09/2020   BUN 11 02/09/2020   NA 139 02/09/2020   K 4.7 02/09/2020   CL 102 02/09/2020   CO2 20 02/09/2020   Lab Results  Component Value Date    ALT 13 02/09/2020   AST 11 02/09/2020   ALKPHOS 54 02/09/2020   BILITOT <0.2 02/09/2020   Lab Results  Component Value Date   HGBA1C 5.6 07/10/2020   HGBA1C 5.5 02/09/2020   Lab Results  Component Value Date   INSULIN 13.4 07/10/2020   INSULIN 13.5 02/09/2020   Lab Results  Component Value Date   TSH 2.330 02/09/2020   Lab Results  Component Value Date   CHOL 173 07/10/2020   HDL 56 07/10/2020   LDLCALC 97 07/10/2020   TRIG 110 07/10/2020   CHOLHDL 3 04/01/2019   Lab Results  Component Value Date   VD25OH 52.2 07/10/2020   VD25OH 25.5 (L) 02/09/2020   Lab Results  Component Value Date   WBC 9.9 02/09/2020   HGB 13.2 02/09/2020   HCT 40.6 02/09/2020   MCV 84 02/09/2020   PLT 428 02/09/2020    Attestation Statements:   Reviewed by clinician on day of visit: allergies, medications, problem list, medical history, surgical history, family history, social history, and previous encounter notes.  Edmund Hilda, CMA, am acting as transcriptionist for [name], MD.   I have reviewed the above documentation for accuracy and completeness, and I agree with the above. - [name], MD

## 2020-08-29 ENCOUNTER — Encounter: Payer: Self-pay | Admitting: Family Medicine

## 2020-08-29 ENCOUNTER — Other Ambulatory Visit: Payer: Self-pay

## 2020-08-29 ENCOUNTER — Ambulatory Visit: Payer: Self-pay

## 2020-08-29 ENCOUNTER — Encounter (INDEPENDENT_AMBULATORY_CARE_PROVIDER_SITE_OTHER): Payer: Self-pay

## 2020-08-29 ENCOUNTER — Ambulatory Visit (INDEPENDENT_AMBULATORY_CARE_PROVIDER_SITE_OTHER): Payer: 59 | Admitting: Family Medicine

## 2020-08-29 VITALS — BP 132/94 | HR 73 | Ht 66.0 in

## 2020-08-29 DIAGNOSIS — M9902 Segmental and somatic dysfunction of thoracic region: Secondary | ICD-10-CM | POA: Diagnosis not present

## 2020-08-29 DIAGNOSIS — M898X1 Other specified disorders of bone, shoulder: Secondary | ICD-10-CM

## 2020-08-29 DIAGNOSIS — M9908 Segmental and somatic dysfunction of rib cage: Secondary | ICD-10-CM

## 2020-08-29 DIAGNOSIS — M9901 Segmental and somatic dysfunction of cervical region: Secondary | ICD-10-CM | POA: Diagnosis not present

## 2020-08-29 DIAGNOSIS — M25512 Pain in left shoulder: Secondary | ICD-10-CM | POA: Diagnosis not present

## 2020-08-29 NOTE — Assessment & Plan Note (Signed)
Left shoulder pain seems to be multifactorial.  I do think there is some secondary to scapular dyskinesis but also on ultrasound seems to have some mild swelling noted of the acromioclavicular joint.  With patient improving I think we can continue to monitor.  Does respond well to manipulation.  Discussed icing regimen and home exercises.  Continue to work on posture.  Follow-up again 6 weeks or as needed

## 2020-08-29 NOTE — Patient Instructions (Signed)
No more hair whip maneuver Keep hands within peripheral vision  OK to lift but ice and pennsaid to top of shoulder See me when you need me

## 2020-08-29 NOTE — Progress Notes (Signed)
[facility] [address] [place] [address] Phone: [phone] Subjective:   Diana King, am serving as a scribe for Dr. Antoine King.  This visit occurred during the SARS-CoV-2 public health emergency.  Safety protocols were in place, including screening questions prior to the visit, additional usage of staff PPE, and extensive cleaning of exam room while observing appropriate contact time as indicated for disinfecting solutions.   I'm seeing this patient by the request  of:  Diana Sanes, MD  CC: Neck and back pain follow-up  Diana  Diana King is a 30 y.o. female coming in with complaint of back and neck pain. OMT 07/17/2020. Patient states that she has been having pain for past 2 weeks in L clavicle. Pain with shoulder movements such as abduction. Patient notices pain when she puts her arm at 12 o'clock on steering wheel.   Medications patient has been prescribed: Zanaflex          Reviewed prior external information including notes and imaging from previsou exam, outside providers and external EMR if available.   As well as notes that were available from care everywhere and other healthcare systems.  Past medical history, social, surgical and family history all reviewed in electronic medical record.  No pertanent information unless stated regarding to the chief complaint.   Past Medical History:  Diagnosis Date   Acne    Anxiety    Back pain    Depression    GERD (gastroesophageal reflux disease)    IBS (irritable bowel syndrome)    Overweight    Pain in Achilles tendon    Palpitations     Allergies  Allergen Reactions   Latex Swelling     Review of Systems:  No headache, visual changes, nausea, vomiting, diarrhea, constipation, dizziness, abdominal pain, skin rash, fevers, chills, night sweats, weight loss, swollen lymph nodes, body aches, joint swelling, chest pain, shortness of breath, mood changes.  POSITIVE muscle aches  Objective  Last menstrual period 08/13/2020.   General: No apparent distress alert and oriented x3 mood and affect normal, dressed appropriately.  HEENT: Pupils equal, extraocular movements intact  Respiratory: Patient's speak in full sentences and does not appear short of breath  Cardiovascular: No lower extremity edema, non tender, no erythema  Patient's left shoulder does have good range of motion with very mild positive impingement but seems to be more secondary to crossover.  5-5 strength of the rotator cuff noted.  Osteopathic findings  C2 flexed rotated and side bent right C6 flexed rotated and side bent left T3 extended rotated and side bent left inhaled rib   Limited muscular skeletal ultrasound was performed and interpreted by Diana King, M  Limited ultrasound of patient's shoulder does not show anything significant except for very mild hypoechoic changes of the acromioclavicular joint.  Rotator cuff appears to be intact with very mild hypoechoic changes.       Assessment and Plan:  Left shoulder pain Left shoulder pain seems to be multifactorial.  I do think there is some secondary to scapular dyskinesis but also on ultrasound seems to have some mild swelling noted of the acromioclavicular joint.  With patient improving I think we can continue to monitor.  Does respond well to manipulation.  Discussed icing regimen and home exercises.  Continue to work on posture.  Follow-up again 6 weeks or as needed   Nonallopathic problems  Decision today to treat with OMT was based on Physical  Exam  After verbal consent patient was treated with HVLA, ME, FPR techniques in cervical, rib, thoracic,  areas  Patient tolerated the procedure well with improvement in symptoms  Patient given exercises, stretches and lifestyle modifications  See medications in patient instructions if given  Patient will follow up in 4-8 weeks      The above documentation  has been reviewed and is accurate and complete Diana Saa, DO       Note: This dictation was prepared with Dragon dictation along with smaller phrase technology. Any transcriptional errors that result from this process are unintentional.

## 2020-09-16 ENCOUNTER — Other Ambulatory Visit: Payer: Self-pay | Admitting: Internal Medicine

## 2020-09-28 ENCOUNTER — Ambulatory Visit (INDEPENDENT_AMBULATORY_CARE_PROVIDER_SITE_OTHER): Payer: 59 | Admitting: Family Medicine

## 2020-09-28 ENCOUNTER — Encounter (INDEPENDENT_AMBULATORY_CARE_PROVIDER_SITE_OTHER): Payer: Self-pay | Admitting: Family Medicine

## 2020-09-28 ENCOUNTER — Other Ambulatory Visit: Payer: Self-pay

## 2020-09-28 VITALS — BP 123/80 | HR 64 | Temp 97.5°F | Ht 66.0 in | Wt 180.0 lb

## 2020-09-28 DIAGNOSIS — Z9189 Other specified personal risk factors, not elsewhere classified: Secondary | ICD-10-CM | POA: Diagnosis not present

## 2020-09-28 DIAGNOSIS — E669 Obesity, unspecified: Secondary | ICD-10-CM

## 2020-09-28 DIAGNOSIS — Z6833 Body mass index (BMI) 33.0-33.9, adult: Secondary | ICD-10-CM | POA: Diagnosis not present

## 2020-09-28 DIAGNOSIS — R632 Polyphagia: Secondary | ICD-10-CM

## 2020-09-28 DIAGNOSIS — E559 Vitamin D deficiency, unspecified: Secondary | ICD-10-CM | POA: Diagnosis not present

## 2020-09-28 MED ORDER — TIRZEPATIDE 5 MG/0.5ML ~~LOC~~ SOAJ: 5.0000 mg | SUBCUTANEOUS | 0 refills | Status: DC

## 2020-09-28 NOTE — Progress Notes (Signed)
Chief Complaint:   OBESITY Diana King is here to discuss her progress with her obesity treatment plan along with follow-up of her obesity related diagnoses. Diana King is on the Category 4 Plan and states she is following her eating plan approximately 83% of the time. Diana King states she is doing high intensity workout 45 minutes 4 times per week.  Today's visit was #: 12 Starting weight: 207 lbs Starting date: 02/09/2020 Today's weight: 180 lbs Today's date: 09/28/2020 Total lbs lost to date: 27 Total lbs lost since last in-office visit: 4  Interim History: Diana King is planning her daughter's 9th birthday. She is really enjoying Mounjaro. She is not snacking the way she was previously. Pt has no upcoming travel or plans except the birthday party. She is going to [facility] at some point in [place].  Subjective:   1. Polyphagia Mikaili denies GI side effects of Mounjaro 2.5 mg dose. She notes better control of snacking.  2. Vitamin D deficiency Pt denies nausea, vomiting, and muscle weakness but notes fatigue. She is now on OTC Vit D.  3. At risk for side effect of medication Diana King is at risk for side effects of medication due to increasing dose of Mounjaro.  Assessment/Plan:   1. Polyphagia Diana King will increase Mounjaro to 5 mg weekly. Intensive lifestyle modifications are the first line treatment for this issue. We discussed several lifestyle modifications today and she will continue to work on diet, exercise and weight loss efforts. Orders and follow up as documented in patient record.  Counseling Polyphagia is excessive hunger. Causes can include: low blood sugars, hypERthyroidism, PMS, lack of sleep, stress, insulin resistance, diabetes, certain medications, and diets that are deficient in protein and fiber.   Increase and Refill- tirzepatide (MOUNJARO) 5 MG/0.5ML Pen; Inject 5 mg into the skin once a week.  Dispense: 6 mL; Refill: 0  2. Vitamin D deficiency Low Vitamin D level  contributes to fatigue and are associated with obesity, breast, and colon cancer. She agrees to continue OTC Vitamin D 5,000 IU daily and will follow-up for routine testing of Vitamin D, at least 2-3 times per year to avoid over-replacement.  3. At risk for side effect of medication Diana King was given approximately 15 minutes of drug side effect counseling today.  We discussed side effect possibility and risk versus benefits. Diana King agreed to the medication and will contact this office if these side effects are intolerable.  Repetitive spaced learning was employed today to elicit superior memory formation and behavioral change.  4. Obesity with current BMI of 29.1  Diana King is currently in the action stage of change. As such, her goal is to continue with weight loss efforts. She has agreed to the Category 4 Plan.   Exercise goals:  As is  Behavioral modification strategies: increasing lean protein intake, meal planning and cooking strategies, keeping healthy foods in the home, and better snacking choices.  Diana King has agreed to follow-up with our clinic in 3 weeks. She was informed of the importance of frequent follow-up visits to maximize her success with intensive lifestyle modifications for her multiple health conditions.   Objective:   Blood pressure 123/80, pulse 64, temperature (!) 97.5 F (36.4 C), height 5\' 6"  (1.676 m), weight 180 lb (81.6 kg), SpO2 99 %. Body mass index is 29.05 kg/m.  General: Cooperative, alert, well developed, in no acute distress. HEENT: Conjunctivae and lids unremarkable. Cardiovascular: Regular rhythm.  Lungs: Normal work of breathing. Neurologic: No focal deficits.   Lab  Results  Component Value Date   CREATININE 0.79 02/09/2020   BUN 11 02/09/2020   NA 139 02/09/2020   K 4.7 02/09/2020   CL 102 02/09/2020   CO2 20 02/09/2020   Lab Results  Component Value Date   ALT 13 02/09/2020   AST 11 02/09/2020   ALKPHOS 54 02/09/2020   BILITOT <0.2  02/09/2020   Lab Results  Component Value Date   HGBA1C 5.6 07/10/2020   HGBA1C 5.5 02/09/2020   Lab Results  Component Value Date   INSULIN 13.4 07/10/2020   INSULIN 13.5 02/09/2020   Lab Results  Component Value Date   TSH 2.330 02/09/2020   Lab Results  Component Value Date   CHOL 173 07/10/2020   HDL 56 07/10/2020   LDLCALC 97 07/10/2020   TRIG 110 07/10/2020   CHOLHDL 3 04/01/2019   Lab Results  Component Value Date   VD25OH 52.2 07/10/2020   VD25OH 25.5 (L) 02/09/2020   Lab Results  Component Value Date   WBC 9.9 02/09/2020   HGB 13.2 02/09/2020   HCT 40.6 02/09/2020   MCV 84 02/09/2020   PLT 428 02/09/2020    Attestation Statements:   Reviewed by clinician on day of visit: allergies, medications, problem list, medical history, surgical history, family history, social history, and previous encounter notes.  Edmund Hilda, CMA, am acting as transcriptionist for [name], MD.   I have reviewed the above documentation for accuracy and completeness, and I agree with the above. - [name], MD

## 2020-10-03 ENCOUNTER — Encounter (INDEPENDENT_AMBULATORY_CARE_PROVIDER_SITE_OTHER): Payer: Self-pay

## 2020-10-18 NOTE — Progress Notes (Signed)
  [name] [address] [place] [address] Phone: [phone] Subjective:   Bruce Donath, am serving as a scribe for Dr. Antoine Primas. This visit occurred during the SARS-CoV-2 public health emergency.  Safety protocols were in place, including screening questions prior to the visit, additional usage of staff PPE, and extensive cleaning of exam room while observing appropriate contact time as indicated for disinfecting solutions.   I'm seeing this patient by the request  of:  Pincus Sanes, MD  CC: Low back pain  [id number]  Diana King is a 30 y.o. female coming in with complaint of back and neck pain. OMT 08/29/2020. Patient states that she has had an increase in thoracic spine pain recently.   Medications patient has been prescribed: Zanaflex          Past Medical History:  Diagnosis Date   Acne    Anxiety    Back pain    Depression    GERD (gastroesophageal reflux disease)    IBS (irritable bowel syndrome)    Overweight    Pain in Achilles tendon    Palpitations     Allergies  Allergen Reactions   Latex Swelling     Review of Systems:  No headache, visual changes, nausea, vomiting, diarrhea, constipation, dizziness, abdominal pain, skin rash, fevers, chills, night sweats, weight loss, swollen lymph nodes, body aches, joint swelling, chest pain, shortness of breath, mood changes. POSITIVE muscle aches  Objective  Blood pressure 122/84, pulse 81, height 5\' 6"  (1.676 m), SpO2 98 %.   General: No apparent distress alert and oriented x3 mood and affect normal, dressed appropriately.  HEENT: Pupils equal, extraocular movements intact  Respiratory: Patient's speak in full sentences and does not appear short of breath  Cardiovascular: No lower extremity edema, non tender, no erythema  Low back exam does show the patient still has some mild tightness noted in the thoracolumbar juncture.  Minimal pain over the sacroiliac  joint.  With more discomfort noted in the cervical thoracic area than usual.  Osteopathic findings  C2 flexed rotated and side bent right C7 flexed rotated and side bent left T3 extended rotated and side bent right inhaled rib T9 extended rotated and side bent left L2 flexed rotated and side bent right Sacrum right on right       Assessment and Plan:  Low back pain Low back pain and upper back pain.  Seems to be exacerbation of underlying condition.  Has been a little more recently.  He does feel that this is contributing.  Has been continuing medications and that he has some GI discomfort.  Discussed with patient about the probiotics.  Follow-up with me again for 6   Nonallopathic problems  Decision today to treat with OMT was based on Physical Exam  After verbal consent patient was treated with HVLA, ME, FPR techniques in cervical, rib, thoracic, lumbar, and sacral  areas  Patient tolerated the procedure well with improvement in symptoms  Patient given exercises, stretches and lifestyle modifications  See medications in patient instructions if given  Patient will follow up in 4-8 weeks      The above documentation has been reviewed and is accurate and complete , DO        Note: This dictation was prepared with Dragon dictation along with smaller phrase technology. Any transcriptional errors that result from this process are unintentional.

## 2020-10-19 ENCOUNTER — Ambulatory Visit (INDEPENDENT_AMBULATORY_CARE_PROVIDER_SITE_OTHER): Payer: 59 | Admitting: Family Medicine

## 2020-10-19 ENCOUNTER — Encounter: Payer: Self-pay | Admitting: Family Medicine

## 2020-10-19 ENCOUNTER — Other Ambulatory Visit: Payer: Self-pay

## 2020-10-19 ENCOUNTER — Encounter (INDEPENDENT_AMBULATORY_CARE_PROVIDER_SITE_OTHER): Payer: Self-pay | Admitting: Family Medicine

## 2020-10-19 VITALS — BP 138/77 | HR 72 | Temp 98.5°F | Ht 66.0 in | Wt 177.0 lb

## 2020-10-19 DIAGNOSIS — M545 Low back pain, unspecified: Secondary | ICD-10-CM

## 2020-10-19 DIAGNOSIS — Z6833 Body mass index (BMI) 33.0-33.9, adult: Secondary | ICD-10-CM

## 2020-10-19 DIAGNOSIS — E559 Vitamin D deficiency, unspecified: Secondary | ICD-10-CM

## 2020-10-19 DIAGNOSIS — Z9189 Other specified personal risk factors, not elsewhere classified: Secondary | ICD-10-CM

## 2020-10-19 DIAGNOSIS — E669 Obesity, unspecified: Secondary | ICD-10-CM

## 2020-10-19 DIAGNOSIS — R632 Polyphagia: Secondary | ICD-10-CM

## 2020-10-19 MED ORDER — TIRZEPATIDE 5 MG/0.5ML ~~LOC~~ SOAJ: 5.0000 mg | SUBCUTANEOUS | 0 refills | Status: DC

## 2020-10-19 NOTE — Progress Notes (Signed)
Chief Complaint:   OBESITY Diana King is here to discuss her progress with her obesity treatment plan along with follow-up of her obesity related diagnoses. Diana King is on the Category 4 Plan and states she is following her eating plan approximately 75% of the time. Diana King states she is doing Burn HIIT 45 minutes 3-4 times per week.  Today's visit was #: 13 Starting weight: 207 lbs Starting date: 02/09/2020 Today's weight: 177 lbs Today's date: 10/19/2020 Total lbs lost to date: 30 Total lbs lost since last in-office visit: 3  Interim History: Diana King had her daughter's birthday yesterday and birthday party last weekend. Yesterday, they went to [employer] and got 2 hamsters. This past week, she feels she didn't follow as strictly. Pt is noticing she can't finish all of her meals. She wants to stay more consistently on plan over the next 2 weeks.  Subjective:   1. Polyphagia Diana King is doing well on Mounjaro and denies GI side effects. She is noticing some improvement in control of food quantity intake.  2. Vitamin D deficiency Diana King is on OTC Vit D 5,000 IU daily. Pt denies nausea, vomiting, and muscle weakness but notes fatigue.   3. At risk for side effect of medication Diana King is at risk for side effects of medication due to being on Mounjaro 5 mg.  Assessment/Plan:   1. Polyphagia Pt will continue Mounjaro 5 mg as directed. Intensive lifestyle modifications are the first line treatment for this issue. We discussed several lifestyle modifications today and she will continue to work on diet, exercise and weight loss efforts. Orders and follow up as documented in patient record.  Counseling Polyphagia is excessive hunger. Causes can include: low blood sugars, hypERthyroidism, PMS, lack of sleep, stress, insulin resistance, diabetes, certain medications, and diets that are deficient in protein and fiber.   Refill- tirzepatide (MOUNJARO) 5 MG/0.5ML Pen; Inject 5 mg into the skin  once a week.  Dispense: 6 mL; Refill: 0  2. Vitamin D deficiency Low Vitamin D level contributes to fatigue and are associated with obesity, breast, and colon cancer. She agrees to continue to take OTC Vitamin D 5,000 IU daily and will follow-up for routine testing of Vitamin D, at least 2-3 times per year to avoid over-replacement.  3. At risk for side effect of medication Diana King was given approximately 15 minutes of drug side effect counseling today.  We discussed side effect possibility and risk versus benefits. Diana King agreed to the medication and will contact this office if these side effects are intolerable.  Repetitive spaced learning was employed today to elicit superior memory formation and behavioral change.   4. Obesity with current BMI of 28.7  Diana King is currently in the action stage of change. As such, her goal is to continue with weight loss efforts. She has agreed to the Category 4 Plan.   Exercise goals:  As is  Behavioral modification strategies: increasing lean protein intake, meal planning and cooking strategies, and keeping healthy foods in the home.  Diana King has agreed to follow-up with our clinic in 3-4 weeks. She was informed of the importance of frequent follow-up visits to maximize her success with intensive lifestyle modifications for her multiple health conditions.   Objective:   Blood pressure 138/77, pulse 72, temperature 98.5 F (36.9 C), height 5\' 6"  (1.676 m), weight 177 lb (80.3 kg), last menstrual period 10/05/2020, SpO2 97 %. Body mass index is 28.57 kg/m.  General: Cooperative, alert, well developed, in no acute distress.  HEENT: Conjunctivae and lids unremarkable. Cardiovascular: Regular rhythm.  Lungs: Normal work of breathing. Neurologic: No focal deficits.   Lab Results  Component Value Date   CREATININE 0.79 02/09/2020   BUN 11 02/09/2020   NA 139 02/09/2020   K 4.7 02/09/2020   CL 102 02/09/2020   CO2 20 02/09/2020   Lab Results   Component Value Date   ALT 13 02/09/2020   AST 11 02/09/2020   ALKPHOS 54 02/09/2020   BILITOT <0.2 02/09/2020   Lab Results  Component Value Date   HGBA1C 5.6 07/10/2020   HGBA1C 5.5 02/09/2020   Lab Results  Component Value Date   INSULIN 13.4 07/10/2020   INSULIN 13.5 02/09/2020   Lab Results  Component Value Date   TSH 2.330 02/09/2020   Lab Results  Component Value Date   CHOL 173 07/10/2020   HDL 56 07/10/2020   LDLCALC 97 07/10/2020   TRIG 110 07/10/2020   CHOLHDL 3 04/01/2019   Lab Results  Component Value Date   VD25OH 52.2 07/10/2020   VD25OH 25.5 (L) 02/09/2020   Lab Results  Component Value Date   WBC 9.9 02/09/2020   HGB 13.2 02/09/2020   HCT 40.6 02/09/2020   MCV 84 02/09/2020   PLT 428 02/09/2020    Attestation Statements:   Reviewed by clinician on day of visit: allergies, medications, problem list, medical history, surgical history, family history, social history, and previous encounter notes.  Edmund Hilda, CMA, am acting as transcriptionist for [name], MD.   I have reviewed the above documentation for accuracy and completeness, and I agree with the above. - [name], MD

## 2020-10-19 NOTE — Assessment & Plan Note (Signed)
Low back pain and upper back pain.  Seems to be exacerbation of underlying condition.  Has been a little more recently.  He does feel that this is contributing.  Has been continuing medications and that he has some GI discomfort.  Discussed with patient about the probiotics.  Follow-up with me again for 6

## 2020-10-31 ENCOUNTER — Ambulatory Visit: Payer: 59 | Admitting: Family Medicine

## 2020-11-06 ENCOUNTER — Encounter (INDEPENDENT_AMBULATORY_CARE_PROVIDER_SITE_OTHER): Payer: Self-pay

## 2020-11-08 ENCOUNTER — Other Ambulatory Visit: Payer: Self-pay | Admitting: Internal Medicine

## 2020-11-13 ENCOUNTER — Other Ambulatory Visit: Payer: Self-pay

## 2020-11-13 ENCOUNTER — Ambulatory Visit (INDEPENDENT_AMBULATORY_CARE_PROVIDER_SITE_OTHER): Payer: 59 | Admitting: Family Medicine

## 2020-11-13 ENCOUNTER — Encounter (INDEPENDENT_AMBULATORY_CARE_PROVIDER_SITE_OTHER): Payer: Self-pay | Admitting: Family Medicine

## 2020-11-13 VITALS — BP 136/86 | HR 71 | Temp 98.1°F | Ht 66.0 in | Wt 174.0 lb

## 2020-11-13 DIAGNOSIS — E7849 Other hyperlipidemia: Secondary | ICD-10-CM | POA: Diagnosis not present

## 2020-11-13 DIAGNOSIS — Z6833 Body mass index (BMI) 33.0-33.9, adult: Secondary | ICD-10-CM

## 2020-11-13 DIAGNOSIS — Z9189 Other specified personal risk factors, not elsewhere classified: Secondary | ICD-10-CM | POA: Diagnosis not present

## 2020-11-13 DIAGNOSIS — R632 Polyphagia: Secondary | ICD-10-CM

## 2020-11-13 DIAGNOSIS — E669 Obesity, unspecified: Secondary | ICD-10-CM | POA: Diagnosis not present

## 2020-11-13 MED ORDER — TIRZEPATIDE 7.5 MG/0.5ML ~~LOC~~ SOAJ
7.5000 mg | SUBCUTANEOUS | 0 refills | Status: DC
Start: 2020-11-13 — End: 2020-12-28

## 2020-11-13 NOTE — Progress Notes (Signed)
Chief Complaint:   OBESITY Diana King is here to discuss her progress with her obesity treatment plan along with follow-up of her obesity related diagnoses. Diana King is on the Category 4 Plan and states she is following her eating plan approximately 80% of the time. Diana King states she is doing Burn HIT 45 minutes 4-5 times per week.  Today's visit was #: 14 Starting weight: 207 lbs Starting date: 02/09/2020 Today's weight: 174 lbs Today's date: 11/13/2020 Total lbs lost to date: 33 Total lbs lost since last in-office visit: 3  Interim History: Diana King is driving up to [place] for Halloween and will be gone for 4 days. Since her last appt, she got factor box for increased protein foods, especially on the weekends. She is still having cravings for sweets. Pt is getting all food in, except fruit.  Subjective:   1. Polyphagia Diana King is on Mounjaro 5 mg with no GI side effects. She is still having increased drive for sweets.  2. Other hyperlipidemia Pt has an LDL of 97 (prior 123) and triglycerides 110. She is not on statin therapy.  3. At risk for side effect of medication Diana King is at risk for side effects if medication due to increasing Mounjaro to 7.5 mg.  Assessment/Plan:   1. Polyphagia Intensive lifestyle modifications are the first line treatment for this issue. We discussed several lifestyle modifications today and she will continue to work on diet, exercise and weight loss efforts. Orders and follow up as documented in patient record. Increase Mounjaro to 7.5 mg weekly.  Counseling Polyphagia is excessive hunger. Causes can include: low blood sugars, hypERthyroidism, PMS, lack of sleep, stress, insulin resistance, diabetes, certain medications, and diets that are deficient in protein and fiber.   Increase and Refill- tirzepatide (MOUNJARO) 7.5 MG/0.5ML Pen; Inject 7.5 mg into the skin once a week.  Dispense: 6 mL; Refill: 0  2. Other hyperlipidemia Cardiovascular risk and  specific lipid/LDL goals reviewed.  We discussed several lifestyle modifications today and Diana King will continue to work on diet, exercise and weight loss efforts. Orders and follow up as documented in patient record. Continue category 4 meal plan.  Counseling Intensive lifestyle modifications are the first line treatment for this issue. Dietary changes: Increase soluble fiber. Decrease simple carbohydrates. Exercise changes: Moderate to vigorous-intensity aerobic activity 150 minutes per week if tolerated. Lipid-lowering medications: see documented in medical record.  3. At risk for side effect of medication Diana King was given approximately 15 minutes of drug side effect counseling today.  We discussed side effect possibility and risk versus benefits. Diana King agreed to the medication and will contact this office if these side effects are intolerable.  Repetitive spaced learning was employed today to elicit superior memory formation and behavioral change.   4. Obesity with current BMI of 28.2  Diana King is currently in the action stage of change. As such, her goal is to continue with weight loss efforts. She has agreed to the Category 4 Plan.   Exercise goals: All adults should avoid inactivity. Some physical activity is better than none, and adults who participate in any amount of physical activity gain some health benefits.  Behavioral modification strategies: increasing lean protein intake, meal planning and cooking strategies, keeping healthy foods in the home, and planning for success.  Diana King has agreed to follow-up with our clinic in 3 weeks. She was informed of the importance of frequent follow-up visits to maximize her success with intensive lifestyle modifications for her multiple health conditions.  Objective:   Blood pressure 136/86, pulse 71, temperature 98.1 F (36.7 C), height 5\' 6"  (1.676 m), weight 174 lb (78.9 kg), last menstrual period 11/04/2020, SpO2 98 %. Body mass index is  28.08 kg/m.  General: Cooperative, alert, well developed, in no acute distress. HEENT: Conjunctivae and lids unremarkable. Cardiovascular: Regular rhythm.  Lungs: Normal work of breathing. Neurologic: No focal deficits.   Lab Results  Component Value Date   CREATININE 0.79 02/09/2020   BUN 11 02/09/2020   NA 139 02/09/2020   K 4.7 02/09/2020   CL 102 02/09/2020   CO2 20 02/09/2020   Lab Results  Component Value Date   ALT 13 02/09/2020   AST 11 02/09/2020   ALKPHOS 54 02/09/2020   BILITOT <0.2 02/09/2020   Lab Results  Component Value Date   HGBA1C 5.6 07/10/2020   HGBA1C 5.5 02/09/2020   Lab Results  Component Value Date   INSULIN 13.4 07/10/2020   INSULIN 13.5 02/09/2020   Lab Results  Component Value Date   TSH 2.330 02/09/2020   Lab Results  Component Value Date   CHOL 173 07/10/2020   HDL 56 07/10/2020   LDLCALC 97 07/10/2020   TRIG 110 07/10/2020   CHOLHDL 3 04/01/2019   Lab Results  Component Value Date   VD25OH 52.2 07/10/2020   VD25OH 25.5 (L) 02/09/2020   Lab Results  Component Value Date   WBC 9.9 02/09/2020   HGB 13.2 02/09/2020   HCT 40.6 02/09/2020   MCV 84 02/09/2020   PLT 428 02/09/2020    Attestation Statements:   Reviewed by clinician on day of visit: allergies, medications, problem list, medical history, surgical history, family history, social history, and previous encounter notes.  02/11/2020, CMA, am acting as transcriptionist for Edmund Hilda, MD.  I have reviewed the above documentation for accuracy and completeness, and I agree with the above. - [name], MD

## 2020-11-14 ENCOUNTER — Encounter (INDEPENDENT_AMBULATORY_CARE_PROVIDER_SITE_OTHER): Payer: Self-pay | Admitting: Family Medicine

## 2020-11-15 ENCOUNTER — Other Ambulatory Visit: Payer: Self-pay

## 2020-11-15 ENCOUNTER — Ambulatory Visit: Payer: 59 | Admitting: Sports Medicine

## 2020-11-15 VITALS — BP 122/96 | HR 80

## 2020-11-15 DIAGNOSIS — M9902 Segmental and somatic dysfunction of thoracic region: Secondary | ICD-10-CM | POA: Diagnosis not present

## 2020-11-15 DIAGNOSIS — M546 Pain in thoracic spine: Secondary | ICD-10-CM | POA: Diagnosis not present

## 2020-11-15 DIAGNOSIS — M9903 Segmental and somatic dysfunction of lumbar region: Secondary | ICD-10-CM

## 2020-11-15 DIAGNOSIS — M9905 Segmental and somatic dysfunction of pelvic region: Secondary | ICD-10-CM

## 2020-11-15 NOTE — Progress Notes (Signed)
   Diana King [name] [address] [place] [address] Phone: [phone]   Assessment and Plan:     1. Acute right-sided thoracic back pain 2. Somatic dysfunction of thoracic region 3. Somatic dysfunction of lumbar region 4. Somatic dysfunction of pelvic region -Chronic with exacerbation, subsequent sports medicine visit - Recurrence of somatic dysfunction that typically been treated well with OMT.  Elected for repeat OMT today's visit - No significant area at today's visit was T4-6   Decision today to treat with OMT was based on Physical Exam   After verbal consent patient was treated with HVLA (high velocity low amplitude), ME (muscle energy), FPR (flex positional release), ST (soft tissue), PC/PD (Pelvic Compression/ Pelvic Decompression) techniques in cervical, rib, thoracic, lumbar, and pelvic areas. Patient tolerated the procedure well with improvement in symptoms.  Patient educated on potential side effects of soreness and recommended to rest, hydrate, and use Tylenol as needed for pain control.   Pertinent previous records reviewed include none   Follow Up: as needed for repeat OMT     Subjective:    Chief Complaint: Patient reports having acute thoracic and upper lumbar spine pain for a few days. Unable to sleep due to pain. Having hard time retracting scapula without pain. Is going to be sitting in car for a while this weekend and would like to get some relief.    Relevant Historical Information: None pertinent  Additional pertinent review of systems negative.  Current Outpatient Medications  Medication Sig Dispense Refill   Adapalene 0.3 % gel Apply 1 application topically at bedtime. 45 g 5   buPROPion (WELLBUTRIN XL) 300 MG 24 hr tablet TAKE 1 TABLET BY MOUTH EVERY DAY 90 tablet 1   busPIRone (BUSPAR) 5 MG tablet TAKE 1 TABLET BY MOUTH THREE TIMES A DAY 270 tablet 1   Cholecalciferol (VITAMIN D3) 125 MCG (5000 UT) CAPS Take by  mouth.     clindamycin (CLINDAGEL) 1 % gel Apply topically 2 (two) times daily.     drospirenone-ethinyl estradiol (YAZ,GIANVI,LORYNA) 3-0.02 MG tablet Take 1 tablet by mouth daily.     meloxicam (MOBIC) 7.5 MG tablet Take 1 tablet (7.5 mg total) by mouth daily. 30 tablet 0   sertraline (ZOLOFT) 50 MG tablet TAKE 1 TABLET BY MOUTH EVERYDAY AT BEDTIME 90 tablet 2   spironolactone (ALDACTONE) 25 MG tablet Take 25 mg by mouth daily.     tirzepatide (MOUNJARO) 7.5 MG/0.5ML Pen Inject 7.5 mg into the skin once a week. 6 mL 0   tiZANidine (ZANAFLEX) 4 MG tablet Take 1 tablet (4 mg total) by mouth every 6 (six) hours as needed for muscle spasms. 30 tablet 1   No current facility-administered medications for this visit.      Objective:     Vitals:   11/15/20 1210  BP: (!) 122/96  Pulse: 80  SpO2: 98%      There is no height or weight on file to calculate BMI.    Physical Exam:     General: Well-appearing, cooperative, sitting comfortably in no acute distress.   OMT Physical Exam:  ASIS Compression Test: Positive Right Thoracic: TTP paraspinal, T4-6 RRSL Lumbar: TTP paraspinal, T12-L2 RRSL Pelvis: Right anterior innominate  Electronically signed by:  Diana King [name] 12:24 PM 11/15/20

## 2020-12-04 ENCOUNTER — Encounter (INDEPENDENT_AMBULATORY_CARE_PROVIDER_SITE_OTHER): Payer: Self-pay | Admitting: Family Medicine

## 2020-12-04 ENCOUNTER — Other Ambulatory Visit: Payer: Self-pay

## 2020-12-04 ENCOUNTER — Ambulatory Visit (INDEPENDENT_AMBULATORY_CARE_PROVIDER_SITE_OTHER): Payer: 59 | Admitting: Sports Medicine

## 2020-12-04 ENCOUNTER — Ambulatory Visit (INDEPENDENT_AMBULATORY_CARE_PROVIDER_SITE_OTHER): Payer: 59 | Admitting: Family Medicine

## 2020-12-04 VITALS — BP 120/83 | HR 74 | Ht 66.0 in | Wt 170.0 lb

## 2020-12-04 VITALS — BP 120/83 | HR 74 | Temp 97.9°F | Ht 66.0 in | Wt 170.0 lb

## 2020-12-04 DIAGNOSIS — M546 Pain in thoracic spine: Secondary | ICD-10-CM | POA: Diagnosis not present

## 2020-12-04 DIAGNOSIS — Z6833 Body mass index (BMI) 33.0-33.9, adult: Secondary | ICD-10-CM | POA: Diagnosis not present

## 2020-12-04 DIAGNOSIS — E669 Obesity, unspecified: Secondary | ICD-10-CM

## 2020-12-04 DIAGNOSIS — M9905 Segmental and somatic dysfunction of pelvic region: Secondary | ICD-10-CM

## 2020-12-04 DIAGNOSIS — R632 Polyphagia: Secondary | ICD-10-CM | POA: Diagnosis not present

## 2020-12-04 DIAGNOSIS — M9908 Segmental and somatic dysfunction of rib cage: Secondary | ICD-10-CM

## 2020-12-04 DIAGNOSIS — M9901 Segmental and somatic dysfunction of cervical region: Secondary | ICD-10-CM | POA: Diagnosis not present

## 2020-12-04 DIAGNOSIS — M9903 Segmental and somatic dysfunction of lumbar region: Secondary | ICD-10-CM

## 2020-12-04 DIAGNOSIS — E559 Vitamin D deficiency, unspecified: Secondary | ICD-10-CM

## 2020-12-04 DIAGNOSIS — M9902 Segmental and somatic dysfunction of thoracic region: Secondary | ICD-10-CM

## 2020-12-04 NOTE — Progress Notes (Signed)
Diana King D.[facility] [address] [place] [address] Phone: [phone]   Assessment and Plan:     1. Acute right-sided thoracic back pain 2. Somatic dysfunction of cervical region 3. Somatic dysfunction of thoracic region 4. Somatic dysfunction of lumbar region 5. Somatic dysfunction of pelvic region 6. Somatic dysfunction of rib region -Chronic with exacerbation, subsequent visit - Recurrence of multiple musculoskeletal complaints with most prominent being right-sided thoracic leading to cervicogenic headaches - Patient has received significant relief with OMT in the past.  Elects for repeat OMT today.  Tolerated well per note below. - Decision today to treat with OMT was based on Physical Exam   After verbal consent patient was treated with HVLA (high velocity low amplitude), ME (muscle energy), FPR (flex positional release), ST (soft tissue), PC/PD (Pelvic Compression/ Pelvic Decompression) techniques in cervical, rib, thoracic, lumbar, and pelvic areas. Patient tolerated the procedure well with improvement in symptoms.  Patient educated on potential side effects of soreness and recommended to rest, hydrate, and use Tylenol as needed for pain control.   Pertinent previous records reviewed include none   Follow Up: As needed for repeat OMT   Subjective:    Chief Complaint: Back pain  HPI: Pt is a 30 y/o female c/o thoracic back, bilat shoulders, and neck pain. Pt was last seen by Dr. Jean Rosenthal on 11/15/20 for OMT. Pt notes she has been experiencing increased frequency of HA. Pt locates pain to right thoracic, cervical genic headaches, low back  Radiates:  LE Numbness/tingling: No LE Weakness: no   Relevant Historical Information: None pertinent  Additional pertinent review of systems negative.  Current Outpatient Medications  Medication Sig Dispense Refill   Adapalene 0.3 % gel Apply 1 application topically at bedtime. 45 g 5   buPROPion  (WELLBUTRIN XL) 300 MG 24 hr tablet TAKE 1 TABLET BY MOUTH EVERY DAY 90 tablet 1   busPIRone (BUSPAR) 5 MG tablet TAKE 1 TABLET BY MOUTH THREE TIMES A DAY 270 tablet 1   Cholecalciferol (VITAMIN D3) 125 MCG (5000 UT) CAPS Take by mouth.     clindamycin (CLINDAGEL) 1 % gel Apply topically 2 (two) times daily.     drospirenone-ethinyl estradiol (YAZ,GIANVI,LORYNA) 3-0.02 MG tablet Take 1 tablet by mouth daily.     meloxicam (MOBIC) 7.5 MG tablet Take 1 tablet (7.5 mg total) by mouth daily. 30 tablet 0   sertraline (ZOLOFT) 50 MG tablet TAKE 1 TABLET BY MOUTH EVERYDAY AT BEDTIME 90 tablet 2   spironolactone (ALDACTONE) 25 MG tablet Take 25 mg by mouth daily.     tirzepatide (MOUNJARO) 7.5 MG/0.5ML Pen Inject 7.5 mg into the skin once a week. 6 mL 0   tiZANidine (ZANAFLEX) 4 MG tablet Take 1 tablet (4 mg total) by mouth every 6 (six) hours as needed for muscle spasms. 30 tablet 1   No current facility-administered medications for this visit.      Objective:     Vitals:   12/04/20 1206  BP: 120/83  Pulse: 74  SpO2: 96%  Weight: 170 lb (77.1 kg)  Height: 5\' 6"  (1.676 m)      Body mass index is 27.44 kg/m.    Physical Exam:     General: Well-appearing, cooperative, sitting comfortably in no acute distress.   OMT Physical Exam:  ASIS Compression Test: Positive Right Cervical: TTP paraspinal, C3 RLSR Rib: Ribs 4-8 inhalation dysfunction on right Thoracic: TTP paraspinal, T4-8 RRSL Lumbar: TTP paraspinal, L2 RLSL,  L5 RR Pelvis: Right anterior innominate  Electronically signed by:  Diana King D.[facility] 1:06 PM 12/04/20

## 2020-12-04 NOTE — Progress Notes (Signed)
Chief Complaint:   OBESITY Diana King is here to discuss her progress with her obesity treatment plan along with follow-up of her obesity related diagnoses. Diana King is on the Category 4 Plan and states she is following her eating plan approximately 90% of the time. Diana King states she is doing HIIT 45 minutes 2 times per week.  Today's visit was #: 15 Starting weight: 207 lbs Starting date: 02/09/2020 Today's weight: 170 lbs Today's date: 12/04/2020 Total lbs lost to date: 37 Total lbs lost since last in-office visit: 4  Interim History: Diana King has been having more frequent headaches with the time change and her daughter has been sick. The next week will be a more low key Thanksgiving. She is doing better on category 4 than she has been. Pt feels that she has been a little "off" the last few weeks.  Subjective:   1. Polyphagia Daphane just started Mounjaro 7.5 mg and notes no side effects. She is doing better with control of food intake.  2. Vitamin D deficiency Diana King is on OTC Vit D 5,000 IU daily and reports fatigue.  Assessment/Plan:   1. Polyphagia Intensive lifestyle modifications are the first line treatment for this issue. We discussed several lifestyle modifications today and she will continue to work on diet, exercise and weight loss efforts. Orders and follow up as documented in patient record. Continue Mounjaro with no change in dose.  Counseling Polyphagia is excessive hunger. Causes can include: low blood sugars, hypERthyroidism, PMS, lack of sleep, stress, insulin resistance, diabetes, certain medications, and diets that are deficient in protein and fiber.   2. Vitamin D deficiency Low Vitamin D level contributes to fatigue and are associated with obesity, breast, and colon cancer. She agrees to continue to take OTC Vitamin D 5,000 IU daily and will follow-up for routine testing of Vitamin D, at least 2-3 times per year to avoid over-replacement.  3. Obesity BMI today  is 81  Diana King is currently in the action stage of change. As such, her goal is to continue with weight loss efforts. She has agreed to the Category 4 Plan.   Exercise goals:  As is  Behavioral modification strategies: increasing lean protein intake, meal planning and cooking strategies, and keeping healthy foods in the home.  Cristian has agreed to follow-up with our clinic in 3-4 weeks. She was informed of the importance of frequent follow-up visits to maximize her success with intensive lifestyle modifications for her multiple health conditions.   Objective:   Blood pressure 120/83, pulse 74, temperature 97.9 F (36.6 C), height 5\' 6"  (1.676 m), weight 170 lb (77.1 kg), last menstrual period 12/04/2020, SpO2 96 %. Body mass index is 27.44 kg/m.  General: Cooperative, alert, well developed, in no acute distress. HEENT: Conjunctivae and lids unremarkable. Cardiovascular: Regular rhythm.  Lungs: Normal work of breathing. Neurologic: No focal deficits.   Lab Results  Component Value Date   CREATININE 0.79 02/09/2020   BUN 11 02/09/2020   NA 139 02/09/2020   K 4.7 02/09/2020   CL 102 02/09/2020   CO2 20 02/09/2020   Lab Results  Component Value Date   ALT 13 02/09/2020   AST 11 02/09/2020   ALKPHOS 54 02/09/2020   BILITOT <0.2 02/09/2020   Lab Results  Component Value Date   HGBA1C 5.6 07/10/2020   HGBA1C 5.5 02/09/2020   Lab Results  Component Value Date   INSULIN 13.4 07/10/2020   INSULIN 13.5 02/09/2020   Lab Results  Component  Value Date   TSH 2.330 02/09/2020   Lab Results  Component Value Date   CHOL 173 07/10/2020   HDL 56 07/10/2020   LDLCALC 97 07/10/2020   TRIG 110 07/10/2020   CHOLHDL 3 04/01/2019   Lab Results  Component Value Date   VD25OH 52.2 07/10/2020   VD25OH 25.5 (L) 02/09/2020   Lab Results  Component Value Date   WBC 9.9 02/09/2020   HGB 13.2 02/09/2020   HCT 40.6 02/09/2020   MCV 84 02/09/2020   PLT 428 02/09/2020     Attestation Statements:   Reviewed by clinician on day of visit: allergies, medications, problem list, medical history, surgical history, family history, social history, and previous encounter notes.  Edmund Hilda, CMA, am acting as transcriptionist for [name], MD.   I have reviewed the above documentation for accuracy and completeness, and I agree with the above. - [name], MD

## 2020-12-11 ENCOUNTER — Telehealth: Payer: Self-pay | Admitting: Family Medicine

## 2020-12-11 ENCOUNTER — Other Ambulatory Visit: Payer: Self-pay

## 2020-12-11 MED ORDER — TIZANIDINE HCL 4 MG PO TABS: 4.0000 mg | ORAL_TABLET | Freq: Every day | ORAL | 1 refills | Status: DC

## 2020-12-11 NOTE — Telephone Encounter (Signed)
Med refilled and patient notified.

## 2020-12-11 NOTE — Telephone Encounter (Signed)
Patient is requesting a refill on tiZANidine (ZANAFLEX) 4 MG tablet to be sent to [facility] on [address].

## 2020-12-24 ENCOUNTER — Other Ambulatory Visit: Payer: Self-pay | Admitting: Internal Medicine

## 2020-12-28 ENCOUNTER — Encounter (INDEPENDENT_AMBULATORY_CARE_PROVIDER_SITE_OTHER): Payer: Self-pay | Admitting: Family Medicine

## 2020-12-28 ENCOUNTER — Other Ambulatory Visit: Payer: Self-pay

## 2020-12-28 ENCOUNTER — Ambulatory Visit (INDEPENDENT_AMBULATORY_CARE_PROVIDER_SITE_OTHER): Payer: 59 | Admitting: Family Medicine

## 2020-12-28 VITALS — BP 128/80 | HR 71 | Temp 97.9°F | Ht 66.0 in | Wt 164.0 lb

## 2020-12-28 DIAGNOSIS — R632 Polyphagia: Secondary | ICD-10-CM

## 2020-12-28 DIAGNOSIS — E669 Obesity, unspecified: Secondary | ICD-10-CM

## 2020-12-28 DIAGNOSIS — Z6833 Body mass index (BMI) 33.0-33.9, adult: Secondary | ICD-10-CM | POA: Diagnosis not present

## 2020-12-28 DIAGNOSIS — E559 Vitamin D deficiency, unspecified: Secondary | ICD-10-CM

## 2020-12-28 MED ORDER — TIRZEPATIDE 5 MG/0.5ML ~~LOC~~ SOAJ: 5.0000 mg | SUBCUTANEOUS | 0 refills | Status: DC

## 2020-12-28 NOTE — Progress Notes (Signed)
Chief Complaint:   OBESITY Diana King is here to discuss her progress with her obesity treatment plan along with follow-up of her obesity related diagnoses. Diana King is on the Category 4 Plan and states she is following her eating plan approximately 85-90% of the time. Diana King states she is doing burn 45 minutes 3-4 times per week.  Today's visit was #: 16 Starting weight: 207 lbs Starting date: 02/09/2020 Today's weight: 164 lbs Today's date: 12/28/2020 Total lbs lost to date: 43 Total lbs lost since last in-office visit: 6  Interim History: Goldia had a low key Thanksgiving- her and her husband. She doesn't have much planned for the next few weeks, except prepping for Christmas. She is starting to see the light at the end of the tunnel at work. Pt can't entirety of food on plan anymore. She is eating more food on weight training days.  Subjective:   1. Polyphagia Diana King is doing well on Mounjaro. She is only able to get about 1/2 of her meal in.  2. Vitamin D deficiency Pt denies nausea, vomiting, and muscle weakness but notes fatigue. She is on OTC Vit D.  Assessment/Plan:   1. Polyphagia Diana King will decrease Mounjaro to 5 mg weekly. Intensive lifestyle modifications are the first line treatment for this issue. We discussed several lifestyle modifications today and she will continue to work on diet, exercise and weight loss efforts. Orders and follow up as documented in patient record.  Counseling Polyphagia is excessive hunger. Causes can include: low blood sugars, hypERthyroidism, PMS, lack of sleep, stress, insulin resistance, diabetes, certain medications, and diets that are deficient in protein and fiber.   Decrease and Refill- tirzepatide (MOUNJARO) 5 MG/0.5ML Pen; Inject 5 mg into the skin once a week.  Dispense: 6 mL; Refill: 0  2. Vitamin D deficiency At goal. Low Vitamin D level contributes to fatigue and are associated with obesity, breast, and colon cancer. She agrees  to continue to take OTC Vitamin D and will follow-up for routine testing of Vitamin D, at least 2-3 times per year to avoid over-replacement.  3. Obesity with current BMI of 26.6  Diana King is currently in the action stage of change. As such, her goal is to continue with weight loss efforts. She has agreed to the Category 4 Plan with 8 oz at dinner.   Exercise goals:  As is  Behavioral modification strategies: increasing lean protein intake, meal planning and cooking strategies, and keeping healthy foods in the home.  Diana King has agreed to follow-up with our clinic in 4 weeks. She was informed of the importance of frequent follow-up visits to maximize her success with intensive lifestyle modifications for her multiple health conditions.   Objective:   Blood pressure 128/80, pulse 71, temperature 97.9 F (36.6 C), height 5\' 6"  (1.676 m), weight 164 lb (74.4 kg), last menstrual period 12/04/2020, SpO2 97 %. Body mass index is 26.47 kg/m.  General: Cooperative, alert, well developed, in no acute distress. HEENT: Conjunctivae and lids unremarkable. Cardiovascular: Regular rhythm.  Lungs: Normal work of breathing. Neurologic: No focal deficits.   Lab Results  Component Value Date   CREATININE 0.79 02/09/2020   BUN 11 02/09/2020   NA 139 02/09/2020   K 4.7 02/09/2020   CL 102 02/09/2020   CO2 20 02/09/2020   Lab Results  Component Value Date   ALT 13 02/09/2020   AST 11 02/09/2020   ALKPHOS 54 02/09/2020   BILITOT <0.2 02/09/2020   Lab Results  Component Value Date   HGBA1C 5.6 07/10/2020   HGBA1C 5.5 02/09/2020   Lab Results  Component Value Date   INSULIN 13.4 07/10/2020   INSULIN 13.5 02/09/2020   Lab Results  Component Value Date   TSH 2.330 02/09/2020   Lab Results  Component Value Date   CHOL 173 07/10/2020   HDL 56 07/10/2020   LDLCALC 97 07/10/2020   TRIG 110 07/10/2020   CHOLHDL 3 04/01/2019   Lab Results  Component Value Date   VD25OH 52.2 07/10/2020    VD25OH 25.5 (L) 02/09/2020   Lab Results  Component Value Date   WBC 9.9 02/09/2020   HGB 13.2 02/09/2020   HCT 40.6 02/09/2020   MCV 84 02/09/2020   PLT 428 02/09/2020    Attestation Statements:   Reviewed by clinician on day of visit: allergies, medications, problem list, medical history, surgical history, family history, social history, and previous encounter notes.  Edmund Hilda, CMA, am acting as transcriptionist for [name], MD.  I have reviewed the above documentation for accuracy and completeness, and I agree with the above. - [name], MD

## 2021-01-03 ENCOUNTER — Telehealth (INDEPENDENT_AMBULATORY_CARE_PROVIDER_SITE_OTHER): Payer: Self-pay | Admitting: Family Medicine

## 2021-01-03 ENCOUNTER — Encounter (INDEPENDENT_AMBULATORY_CARE_PROVIDER_SITE_OTHER): Payer: Self-pay

## 2021-01-03 NOTE — Telephone Encounter (Signed)
Prior authorization denied for Mounjaro. Patient already uses coupon savings card. Patient sent mychart message.  °

## 2021-01-10 ENCOUNTER — Other Ambulatory Visit: Payer: Self-pay | Admitting: Family Medicine

## 2021-01-29 ENCOUNTER — Encounter (INDEPENDENT_AMBULATORY_CARE_PROVIDER_SITE_OTHER): Payer: Self-pay | Admitting: Family Medicine

## 2021-01-29 ENCOUNTER — Ambulatory Visit (INDEPENDENT_AMBULATORY_CARE_PROVIDER_SITE_OTHER): Payer: Self-pay | Admitting: Family Medicine

## 2021-01-29 ENCOUNTER — Other Ambulatory Visit: Payer: Self-pay

## 2021-01-29 VITALS — BP 144/75 | HR 70 | Temp 97.8°F | Ht 66.0 in | Wt 164.0 lb

## 2021-01-29 DIAGNOSIS — Z6826 Body mass index (BMI) 26.0-26.9, adult: Secondary | ICD-10-CM

## 2021-01-29 DIAGNOSIS — E669 Obesity, unspecified: Secondary | ICD-10-CM

## 2021-01-29 DIAGNOSIS — E559 Vitamin D deficiency, unspecified: Secondary | ICD-10-CM

## 2021-01-29 DIAGNOSIS — R030 Elevated blood-pressure reading, without diagnosis of hypertension: Secondary | ICD-10-CM

## 2021-01-29 DIAGNOSIS — R632 Polyphagia: Secondary | ICD-10-CM

## 2021-01-29 MED ORDER — TIRZEPATIDE 7.5 MG/0.5ML ~~LOC~~ SOAJ: 7.5000 mg | SUBCUTANEOUS | 0 refills | Status: DC

## 2021-01-30 NOTE — Progress Notes (Signed)
Chief Complaint:   OBESITY Diana King is here to discuss her progress with her obesity treatment plan along with follow-up of her obesity related diagnoses. Diana King is on the Category 4 Plan and states she is following her eating plan approximately 70% of the time. Diana King states she is doing Burn/HIIT 45 minutes 4 times per week.  Today's visit was #: 17 Starting weight: 207 lbs Starting date: 02/09/2020 Today's weight: 164 lbs Today's date: 01/29/2021 Total lbs lost to date: 43 Total lbs lost since last in-office visit: 0  Interim History: Pt had a low key holiday- saw family and stayed local. Food wise, she ate some indulgences. She was off Mounjaro for 3 weeks and felt significantly less full, not satisfied (pt felt disappointed with return of symptoms). It took 1.5 week to lose holiday weight.  Subjective:   1. Polyphagia Pt is feeling better hunger control. She still has some cravings- started her period today.  2. Vitamin D deficiency Pt is on OTC Vit D 5,000 IU daily and reports fatigue.  3. Elevated blood pressure reading BP outlier today- elevated in comparison to prior readings. Pt denies chest pain/chest pressure/headache.  Assessment/Plan:   1. Polyphagia Intensive lifestyle modifications are the first line treatment for this issue. We discussed several lifestyle modifications today and she will continue to work on diet, exercise and weight loss efforts. Orders and follow up as documented in patient record. Thersa will increase Mounjaro to 7.5 mg weekly.  Counseling Polyphagia is excessive hunger. Causes can include: low blood sugars, hypERthyroidism, PMS, lack of sleep, stress, insulin resistance, diabetes, certain medications, and diets that are deficient in protein and fiber.   Increase & Refill- tirzepatide (MOUNJARO) 7.5 MG/0.5ML Pen; Inject 7.5 mg into the skin once a week.  Dispense: 2 mL; Refill: 0  2. Vitamin D deficiency Low Vitamin D level contributes to  fatigue and are associated with obesity, breast, and colon cancer. She agrees to continue to take OTC Vitamin D 5,000 IU daily and will follow-up for routine testing of Vitamin D, at least 2-3 times per year to avoid over-replacement.  3. Elevated blood pressure reading Diana King is working on healthy weight loss and exercise to improve blood pressure control. We will watch for signs of hypotension as she continues her lifestyle modifications. Follow-up on BP at next appt.  4. Obesity with current BMI of 26.6  Diana King is currently in the action stage of change. As such, her goal is to continue with weight loss efforts. She has agreed to the Category 4 Plan.   Exercise goals:  As is  Behavioral modification strategies: increasing lean protein intake, meal planning and cooking strategies, keeping healthy foods in the home, and planning for success.  Diana King has agreed to follow-up with our clinic in 3 weeks. She was informed of the importance of frequent follow-up visits to maximize her success with intensive lifestyle modifications for her multiple health conditions.   Objective:   Blood pressure (!) 144/75, pulse 70, temperature 97.8 F (36.6 C), height 5\' 6"  (1.676 m), weight 164 lb (74.4 kg), last menstrual period 01/29/2021, SpO2 98 %. Body mass index is 26.47 kg/m.  General: Cooperative, alert, well developed, in no acute distress. HEENT: Conjunctivae and lids unremarkable. Cardiovascular: Regular rhythm.  Lungs: Normal work of breathing. Neurologic: No focal deficits.   Lab Results  Component Value Date   CREATININE 0.79 02/09/2020   BUN 11 02/09/2020   NA 139 02/09/2020   K 4.7 02/09/2020  CL 102 02/09/2020   CO2 20 02/09/2020   Lab Results  Component Value Date   ALT 13 02/09/2020   AST 11 02/09/2020   ALKPHOS 54 02/09/2020   BILITOT <0.2 02/09/2020   Lab Results  Component Value Date   HGBA1C 5.6 07/10/2020   HGBA1C 5.5 02/09/2020   Lab Results  Component Value  Date   INSULIN 13.4 07/10/2020   INSULIN 13.5 02/09/2020   Lab Results  Component Value Date   TSH 2.330 02/09/2020   Lab Results  Component Value Date   CHOL 173 07/10/2020   HDL 56 07/10/2020   LDLCALC 97 07/10/2020   TRIG 110 07/10/2020   CHOLHDL 3 04/01/2019   Lab Results  Component Value Date   VD25OH 52.2 07/10/2020   VD25OH 25.5 (L) 02/09/2020   Lab Results  Component Value Date   WBC 9.9 02/09/2020   HGB 13.2 02/09/2020   HCT 40.6 02/09/2020   MCV 84 02/09/2020   PLT 428 02/09/2020    Attestation Statements:   Reviewed by clinician on day of visit: allergies, medications, problem list, medical history, surgical history, family history, social history, and previous encounter notes.  Edmund Hilda, CMA, am acting as transcriptionist for Diana Likes, MD.  I have reviewed the above documentation for accuracy and completeness, and I agree with the above. - Diana Likes, MD

## 2021-02-03 ENCOUNTER — Other Ambulatory Visit: Payer: Self-pay | Admitting: Family Medicine

## 2021-02-18 ENCOUNTER — Other Ambulatory Visit: Payer: Self-pay | Admitting: Family Medicine

## 2021-02-20 ENCOUNTER — Other Ambulatory Visit: Payer: Self-pay

## 2021-02-20 ENCOUNTER — Encounter (INDEPENDENT_AMBULATORY_CARE_PROVIDER_SITE_OTHER): Payer: Self-pay | Admitting: Family Medicine

## 2021-02-20 ENCOUNTER — Ambulatory Visit (INDEPENDENT_AMBULATORY_CARE_PROVIDER_SITE_OTHER): Payer: BC Managed Care – PPO | Admitting: Family Medicine

## 2021-02-20 VITALS — BP 111/65 | HR 71 | Temp 97.6°F | Ht 66.0 in | Wt 162.0 lb

## 2021-02-20 DIAGNOSIS — E559 Vitamin D deficiency, unspecified: Secondary | ICD-10-CM

## 2021-02-20 DIAGNOSIS — E669 Obesity, unspecified: Secondary | ICD-10-CM | POA: Diagnosis not present

## 2021-02-20 DIAGNOSIS — Z6833 Body mass index (BMI) 33.0-33.9, adult: Secondary | ICD-10-CM

## 2021-02-20 DIAGNOSIS — Z6826 Body mass index (BMI) 26.0-26.9, adult: Secondary | ICD-10-CM | POA: Diagnosis not present

## 2021-02-20 DIAGNOSIS — E7849 Other hyperlipidemia: Secondary | ICD-10-CM

## 2021-02-20 MED ORDER — BUPROPION HCL ER (XL) 300 MG PO TB24: 300.0000 mg | ORAL_TABLET | Freq: Every day | ORAL | 1 refills | Status: DC

## 2021-02-21 ENCOUNTER — Encounter (INDEPENDENT_AMBULATORY_CARE_PROVIDER_SITE_OTHER): Payer: Self-pay

## 2021-02-21 NOTE — Progress Notes (Signed)
Chief Complaint:   OBESITY Diana King is here to discuss her progress with her obesity treatment plan along with follow-up of her obesity related diagnoses. Diana King is on the Category 4 Plan and states she is following her eating plan approximately 83% of the time. Diana King states she is doing [employer] 45 minutes 3-4 times per week.  Today's visit was #: 18 Starting weight: 207 lbs Starting date: 02/09/2020 Today's weight: 162 lbs Today's date: 02/20/2021 Total lbs lost to date: 45 Total lbs lost since last in-office visit: 2  Interim History: Diana King got a new puppy and a new kitten so she is a bit sleep deprived. She is doing ok following plan but recognizes the end of January and early Feb is she and her husband's birthdays, so there has been some indulgent eating and drinking. Diana King is able to get all food in. She needs to go back to the store to pick up food on plan.  Subjective:   1. Other hyperlipidemia Diana King has an LDL of 97, HDL 56, and triglycerides 400. She is not on meds.  2. Vitamin D deficiency Diana King denies nausea, vomiting, and muscle weakness but notes fatigue. Diana King is on OTC Vit D 5K IU/day.  Assessment/Plan:   1. Other hyperlipidemia Cardiovascular risk and specific lipid/LDL goals reviewed.  We discussed several lifestyle modifications today and Diana King will continue to work on diet, exercise and weight loss efforts. Orders and follow up as documented in patient record. Repeat labs at PCP appt in March.  Counseling Intensive lifestyle modifications are the first line treatment for this issue. Dietary changes: Increase soluble fiber. Decrease simple carbohydrates. Exercise changes: Moderate to vigorous-intensity aerobic activity 150 minutes per week if tolerated. Lipid-lowering medications: see documented in medical record.  2. Vitamin D deficiency Low Vitamin D level contributes to fatigue and are associated with obesity, breast, and colon cancer. She agrees to continue to  take OTC Vitamin D 5,000 IU daily and will follow-up for routine testing of Vitamin D, at least 2-3 times per year to avoid over-replacement. Repeat labs at PCP appt in March.  3. Obesity with current BMI of 26.3 Diana King is currently in the action stage of change. As such, her goal is to continue with weight loss efforts. She has agreed to the Category 4 Plan.   Exercise goals:  As is  Behavioral modification strategies: increasing lean protein intake, meal planning and cooking strategies, keeping healthy foods in the home, and planning for success.  Diana King has agreed to follow-up with our clinic in 4 weeks. She was informed of the importance of frequent follow-up visits to maximize her success with intensive lifestyle modifications for her multiple health conditions.   Objective:   Blood pressure 111/65, pulse 71, temperature 97.6 F (36.4 C), height 5\' 6"  (1.676 m), weight 162 lb (73.5 kg), last menstrual period 01/29/2021, SpO2 97 %. Body mass index is 26.15 kg/m.  General: Cooperative, alert, well developed, in no acute distress. HEENT: Conjunctivae and lids unremarkable. Cardiovascular: Regular rhythm.  Lungs: Normal work of breathing. Neurologic: No focal deficits.   Lab Results  Component Value Date   CREATININE 0.79 02/09/2020   BUN 11 02/09/2020   NA 139 02/09/2020   K 4.7 02/09/2020   CL 102 02/09/2020   CO2 20 02/09/2020   Lab Results  Component Value Date   ALT 13 02/09/2020   AST 11 02/09/2020   ALKPHOS 54 02/09/2020   BILITOT <0.2 02/09/2020   Lab Results  Component Value Date   HGBA1C 5.6 07/10/2020   HGBA1C 5.5 02/09/2020   Lab Results  Component Value Date   INSULIN 13.4 07/10/2020   INSULIN 13.5 02/09/2020   Lab Results  Component Value Date   TSH 2.330 02/09/2020   Lab Results  Component Value Date   CHOL 173 07/10/2020   HDL 56 07/10/2020   LDLCALC 97 07/10/2020   TRIG 110 07/10/2020   CHOLHDL 3 04/01/2019   Lab Results  Component  Value Date   VD25OH 52.2 07/10/2020   VD25OH 25.5 (L) 02/09/2020   Lab Results  Component Value Date   WBC 9.9 02/09/2020   HGB 13.2 02/09/2020   HCT 40.6 02/09/2020   MCV 84 02/09/2020   PLT 428 02/09/2020    Attestation Statements:   Reviewed by clinician on day of visit: allergies, medications, problem list, medical history, surgical history, family history, social history, and previous encounter notes.  Edmund Hilda, CMA, am acting as transcriptionist for [name], MD.   I have reviewed the above documentation for accuracy and completeness, and I agree with the above. - [name], MD

## 2021-03-21 ENCOUNTER — Ambulatory Visit (INDEPENDENT_AMBULATORY_CARE_PROVIDER_SITE_OTHER): Payer: BC Managed Care – PPO | Admitting: Family Medicine

## 2021-03-26 ENCOUNTER — Encounter: Payer: Self-pay | Admitting: Internal Medicine

## 2021-03-26 NOTE — Patient Instructions (Addendum)
? ? ? ?Blood work was ordered.   ? ? ?Medications changes include :  none  ? ? ?Your prescription(s) have been sent to your pharmacy.  ? ? ?Return in about 1 year (around 03/28/2022) for CPE. ? ? ? ?Health Maintenance, Female ?Adopting a healthy lifestyle and getting preventive care are important in promoting health and wellness. Ask your health care provider about: ?The right schedule for you to have regular tests and exams. ?Things you can do on your own to prevent diseases and keep yourself healthy. ?What should I know about diet, weight, and exercise? ?Eat a healthy diet ? ?Eat a diet that includes plenty of vegetables, fruits, low-fat dairy products, and lean protein. ?Do not eat a lot of foods that are high in solid fats, added sugars, or sodium. ?Maintain a healthy weight ?Body mass index (BMI) is used to identify weight problems. It estimates body fat based on height and weight. Your health care provider can help determine your BMI and help you achieve or maintain a healthy weight. ?Get regular exercise ?Get regular exercise. This is one of the most important things you can do for your health. Most adults should: ?Exercise for at least 150 minutes each week. The exercise should increase your heart rate and make you sweat (moderate-intensity exercise). ?Do strengthening exercises at least twice a week. This is in addition to the moderate-intensity exercise. ?Spend less time sitting. Even light physical activity can be beneficial. ?Watch cholesterol and blood lipids ?Have your blood tested for lipids and cholesterol at 33 years of age, then have this test every 5 years. ?Have your cholesterol levels checked more often if: ?Your lipid or cholesterol levels are high. ?You are older than 33 years of age. ?You are at high risk for heart disease. ?What should I know about cancer screening? ?Depending on your health history and family history, you may need to have cancer screening at various ages. This may include  screening for: ?Breast cancer. ?Cervical cancer. ?Colorectal cancer. ?Skin cancer. ?Lung cancer. ?What should I know about heart disease, diabetes, and high blood pressure? ?Blood pressure and heart disease ?High blood pressure causes heart disease and increases the risk of stroke. This is more likely to develop in people who have high blood pressure readings or are overweight. ?Have your blood pressure checked: ?Every 3-5 years if you are 34-72 years of age. ?Every year if you are 27 years old or older. ?Diabetes ?Have regular diabetes screenings. This checks your fasting blood sugar level. Have the screening done: ?Once every three years after age 24 if you are at a normal weight and have a low risk for diabetes. ?More often and at a younger age if you are overweight or have a high risk for diabetes. ?What should I know about preventing infection? ?Hepatitis B ?If you have a higher risk for hepatitis B, you should be screened for this virus. Talk with your health care provider to find out if you are at risk for hepatitis B infection. ?Hepatitis C ?Testing is recommended for: ?Everyone born from [birth year] through 1967. ?Anyone with known risk factors for hepatitis C. ?Sexually transmitted infections (STIs) ?Get screened for STIs, including gonorrhea and chlamydia, if: ?You are sexually active and are younger than 33 years of age. ?You are older than 33 years of age and your health care provider tells you that you are at risk for this type of infection. ?Your sexual activity has changed since you were last screened, and you are at  increased risk for chlamydia or gonorrhea. Ask your health care provider if you are at risk. ?Ask your health care provider about whether you are at high risk for HIV. Your health care provider may recommend a prescription medicine to help prevent HIV infection. If you choose to take medicine to prevent HIV, you should first get tested for HIV. You should then be tested every 3 months for as  long as you are taking the medicine. ?Pregnancy ?If you are about to stop having your period (premenopausal) and you may become pregnant, seek counseling before you get pregnant. ?Take 400 to 800 micrograms (mcg) of folic acid every day if you become pregnant. ?Ask for birth control (contraception) if you want to prevent pregnancy. ?Osteoporosis and menopause ?Osteoporosis is a disease in which the bones lose minerals and strength with aging. This can result in bone fractures. If you are 66 years old or older, or if you are at risk for osteoporosis and fractures, ask your health care provider if you should: ?Be screened for bone loss. ?Take a calcium or vitamin D supplement to lower your risk of fractures. ?Be given hormone replacement therapy (HRT) to treat symptoms of menopause. ?Follow these instructions at home: ?Alcohol use ?Do not drink alcohol if: ?Your health care provider tells you not to drink. ?You are pregnant, may be pregnant, or are planning to become pregnant. ?If you drink alcohol: ?Limit how much you have to: ?0-1 drink a day. ?Know how much alcohol is in your drink. In the [place] one drink equals one 12 oz bottle of beer (355 mL), one 5 oz glass of wine (148 mL), or one 1? oz glass of hard liquor (44 mL). ?Lifestyle ?Do not use any products that contain nicotine or tobacco. These products include cigarettes, chewing tobacco, and vaping devices, such as e-cigarettes. If you need help quitting, ask your health care provider. ?Do not use street drugs. ?Do not share needles. ?Ask your health care provider for help if you need support or information about quitting drugs. ?General instructions ?Schedule regular health, dental, and eye exams. ?Stay current with your vaccines. ?Tell your health care provider if: ?You often feel depressed. ?You have ever been abused or do not feel safe at home. ?Summary ?Adopting a healthy lifestyle and getting preventive care are important in promoting health and  wellness. ?Follow your health care provider's instructions about healthy diet, exercising, and getting tested or screened for diseases. ?Follow your health care provider's instructions on monitoring your cholesterol and blood pressure. ?This information is not intended to replace advice given to you by your health care provider. Make sure you discuss any questions you have with your health care provider. ?Document Revised: 05/29/2020 Document Reviewed: 05/29/2020 ?Elsevier Patient Education ? [place]. ? ?

## 2021-03-26 NOTE — Progress Notes (Signed)
? ? ?Subjective:  ? ? Patient ID: Diana King, female    DOB: [birth year], 31 y.o.   MRN: [id number] ? ? ?This visit occurred during the SARS-CoV-2 public health emergency.  Safety protocols were in place, including screening questions prior to the visit, additional usage of staff PPE, and extensive cleaning of exam room while observing appropriate contact time as indicated for disinfecting solutions. ? ? ? ?HPI ?Daya is here for  ?Chief Complaint  ?Patient presents with  ? Annual Exam  ? ? ? ?She has no concerns.  ? ? ?Medications and allergies reviewed with patient and updated if appropriate. ? ? ? ?Current Outpatient Medications on File Prior to Visit  ?Medication Sig Dispense Refill  ? Adapalene 0.3 % gel Apply 1 application topically at bedtime. 45 g 5  ? Cholecalciferol (VITAMIN D3) 125 MCG (5000 UT) CAPS Take by mouth.    ? clindamycin (CLINDAGEL) 1 % gel Apply topically 2 (two) times daily.    ? drospirenone-ethinyl estradiol (YAZ,GIANVI,LORYNA) 3-0.02 MG tablet Take 1 tablet by mouth daily.    ? spironolactone (ALDACTONE) 50 MG tablet Take 50 mg by mouth daily.    ? tirzepatide (MOUNJARO) 7.5 MG/0.5ML Pen Inject 7.5 mg into the skin once a week. 2 mL 0  ? tiZANidine (ZANAFLEX) 4 MG tablet TAKE 1 TABLET BY MOUTH EVERYDAY AT BEDTIME 90 tablet 1  ? clindamycin (CLEOCIN T) 1 % lotion Apply topically at bedtime.    ? ?No current facility-administered medications on file prior to visit.  ? ? ?Review of Systems  ?Constitutional:  Negative for fever.  ?Eyes:  Negative for visual disturbance.  ?Respiratory:  Negative for cough, shortness of breath and wheezing.   ?Cardiovascular:  Negative for chest pain, palpitations and leg swelling.  ?Gastrointestinal:  Negative for abdominal pain, blood in stool, constipation, diarrhea and nausea.  ?Genitourinary:  Negative for dysuria.  ?Musculoskeletal:  Positive for back pain (intermittent). Negative for arthralgias.  ?Skin:  Negative for rash.  ?Neurological:  Negative  for dizziness and headaches.  ?Psychiatric/Behavioral:  Positive for dysphoric mood. The patient is nervous/anxious.   ? ?   ?Objective:  ? ?Vitals:  ? 03/27/21 0753  ?BP: 126/76  ?Pulse: 65  ?Temp: 97.9 ?F (36.6 ?C)  ?SpO2: 99%  ? ?Filed Weights  ? 03/27/21 0753  ?Weight: 161 lb (73 kg)  ? ?Body mass index is 25.99 kg/m?. ? ?BP Readings from Last 3 Encounters:  ?03/27/21 126/76  ?02/20/21 111/65  ?01/29/21 (!) 144/75  ? ? ?Wt Readings from Last 3 Encounters:  ?03/27/21 161 lb (73 kg)  ?02/20/21 162 lb (73.5 kg)  ?01/29/21 164 lb (74.4 kg)  ? ? ?Depression screen [facility] 2/9 04/20/2020 04/14/2020 02/09/2020 04/02/2019 08/31/2018  ?Decreased Interest 0 0 3 0 3  ?Down, Depressed, Hopeless 0 0 3 0 2  ?PHQ - 2 Score 0 0 6 0 5  ?Altered sleeping 1 1 2 1 3   ?Tired, decreased energy 1 1 3 1 3   ?Change in appetite 0 0 3 0 3  ?Feeling bad or failure about yourself  0 0 3 0 2  ?Trouble concentrating 0 0 0 0 0  ?Moving slowly or fidgety/restless 0 0 0 0 0  ?Suicidal thoughts 0 0 0 0 0  ?PHQ-9 Score 2 2 17 2 16   ?Difficult doing work/chores Somewhat difficult Not difficult at all Not difficult at all - -  ? ? ? ?GAD 7 : Generalized Anxiety Score 04/14/2020 04/02/2019 08/31/2018 05/22/2018  ?Nervous,  Anxious, on Edge 1 0 0 0  ?Control/stop worrying 0 0 0 0  ?Worry too much - different things 0 0 0 0  ?Trouble relaxing 0 0 1 0  ?Restless 0 0 0 1  ?Easily annoyed or irritable 1 1 3 2   ?Afraid - awful might happen 0 0 0 0  ?Total GAD 7 Score 2 1 4 3   ?Anxiety Difficulty Not difficult at all - - -  ? ? ? ? ?  ?Physical Exam ?Constitutional: She appears well-developed and well-nourished. No distress.  ?HENT:  ?Head: Normocephalic and atraumatic.  ?Right Ear: External ear normal. Normal ear canal and TM ?Left Ear: External ear normal.  Normal ear canal and TM ?Mouth/Throat: Oropharynx is clear and moist.  ?Eyes: Conjunctivae and EOM are normal.  ?Neck: Neck supple. No tracheal deviation present. No thyromegaly present.  ?No carotid bruit   ?Cardiovascular: Normal rate, regular rhythm and normal heart sounds.   ?No murmur heard.  No edema. ?Pulmonary/Chest: Effort normal and breath sounds normal. No respiratory distress. She has no wheezes. She has no rales.  ?Breast: deferred   ?Abdominal: Soft. She exhibits no distension. There is no tenderness.  ?Lymphadenopathy: She has no cervical adenopathy.  ?Skin: Skin is warm and dry. She is not diaphoretic.  ?Psychiatric: She has a normal mood and affect. Her behavior is normal.  ? ? ? ?Lab Results  ?Component Value Date  ? WBC 9.9 02/09/2020  ? HGB 13.2 02/09/2020  ? HCT 40.6 02/09/2020  ? PLT 428 02/09/2020  ? GLUCOSE 89 02/09/2020  ? CHOL 173 07/10/2020  ? TRIG 110 07/10/2020  ? HDL 56 07/10/2020  ? LDLCALC 97 07/10/2020  ? ALT 13 02/09/2020  ? AST 11 02/09/2020  ? NA 139 02/09/2020  ? K 4.7 02/09/2020  ? CL 102 02/09/2020  ? CREATININE 0.79 02/09/2020  ? BUN 11 02/09/2020  ? CO2 20 02/09/2020  ? TSH 2.330 02/09/2020  ? HGBA1C 5.6 07/10/2020  ? ? ? ? ?   ?Assessment & Plan:  ? ?Physical exam: ?Screening blood work  ordered ?Exercise  [employer] ?Weight  good ?Substance abuse  none ? ? ?Reviewed recommended immunizations. ? ? ?Health Maintenance  ?Topic Date Due  ? Hepatitis C Screening  Never done  ? COVID-19 Vaccine (2 - Pfizer risk series) 04/12/2021 (Originally 01/07/2020)  ? PAP SMEAR-Modifier  03/23/2022  ? TETANUS/TDAP  08/23/2023  ? INFLUENZA VACCINE  Completed  ? HIV Screening  Completed  ? HPV VACCINES  Aged Out  ?  ? ? ? ? ? ? ?See Problem List for Assessment and Plan of chronic medical problems. ? ? ? ? ?

## 2021-03-27 ENCOUNTER — Ambulatory Visit (INDEPENDENT_AMBULATORY_CARE_PROVIDER_SITE_OTHER): Payer: BC Managed Care – PPO | Admitting: Internal Medicine

## 2021-03-27 ENCOUNTER — Other Ambulatory Visit: Payer: Self-pay

## 2021-03-27 VITALS — BP 126/76 | HR 65 | Temp 97.9°F | Ht 66.0 in | Wt 161.0 lb

## 2021-03-27 DIAGNOSIS — F419 Anxiety disorder, unspecified: Secondary | ICD-10-CM | POA: Diagnosis not present

## 2021-03-27 DIAGNOSIS — E8881 Metabolic syndrome: Secondary | ICD-10-CM

## 2021-03-27 DIAGNOSIS — Z0000 Encounter for general adult medical examination without abnormal findings: Secondary | ICD-10-CM

## 2021-03-27 DIAGNOSIS — Z1159 Encounter for screening for other viral diseases: Secondary | ICD-10-CM

## 2021-03-27 DIAGNOSIS — G43109 Migraine with aura, not intractable, without status migrainosus: Secondary | ICD-10-CM

## 2021-03-27 DIAGNOSIS — E559 Vitamin D deficiency, unspecified: Secondary | ICD-10-CM

## 2021-03-27 DIAGNOSIS — F32A Depression, unspecified: Secondary | ICD-10-CM

## 2021-03-27 DIAGNOSIS — E88819 Insulin resistance, unspecified: Secondary | ICD-10-CM | POA: Insufficient documentation

## 2021-03-27 LAB — COMPREHENSIVE METABOLIC PANEL
ALT: 17 U/L (ref 0–35)
AST: 16 U/L (ref 0–37)
Albumin: 4.4 g/dL (ref 3.5–5.2)
Alkaline Phosphatase: 40 U/L (ref 39–117)
BUN: 14 mg/dL (ref 6–23)
CO2: 26 mEq/L (ref 19–32)
Calcium: 9.4 mg/dL (ref 8.4–10.5)
Chloride: 102 mEq/L (ref 96–112)
Creatinine, Ser: 0.95 mg/dL (ref 0.40–1.20)
GFR: 78.97 mL/min (ref >60.00)
Glucose, Bld: 78 mg/dL (ref 70–99)
Potassium: 4 mEq/L (ref 3.5–5.1)
Sodium: 139 mEq/L (ref 135–145)
Total Bilirubin: 0.2 mg/dL (ref 0.2–1.2)
Total Protein: 7.7 g/dL (ref 6.0–8.3)

## 2021-03-27 LAB — CBC WITH DIFFERENTIAL/PLATELET
Basophils Absolute: 0 10*3/uL (ref 0.0–0.1)
Basophils Relative: 0.6 % (ref 0.0–3.0)
Eosinophils Absolute: 0.1 10*3/uL (ref 0.0–0.7)
Eosinophils Relative: 1.5 % (ref 0.0–5.0)
HCT: 42.6 % (ref 36.0–46.0)
Hemoglobin: 14.1 g/dL (ref 12.0–15.0)
Lymphocytes Relative: 39 % (ref 12.0–46.0)
Lymphs Abs: 3.2 10*3/uL (ref 0.7–4.0)
MCHC: 33 g/dL (ref 30.0–36.0)
MCV: 85.7 fl (ref 78.0–100.0)
Monocytes Absolute: 0.7 10*3/uL (ref 0.1–1.0)
Monocytes Relative: 8.4 % (ref 3.0–12.0)
Neutro Abs: 4.1 10*3/uL (ref 1.4–7.7)
Neutrophils Relative %: 50.5 % (ref 43.0–77.0)
Platelets: 391 10*3/uL (ref 150.0–400.0)
RBC: 4.97 Mil/uL (ref 3.87–5.11)
RDW: 12.7 % (ref 11.5–15.5)
WBC: 8.2 10*3/uL (ref 4.0–10.5)

## 2021-03-27 LAB — LIPID PANEL
Cholesterol: 211 mg/dL — ABNORMAL HIGH (ref 0–200)
HDL: 63.1 mg/dL (ref >39.00)
LDL Cholesterol: 134 mg/dL — ABNORMAL HIGH (ref 0–99)
NonHDL: 147.96
Total CHOL/HDL Ratio: 3
Triglycerides: 68 mg/dL (ref 0.0–149.0)
VLDL: 13.6 mg/dL (ref 0.0–40.0)

## 2021-03-27 LAB — VITAMIN D 25 HYDROXY (VIT D DEFICIENCY, FRACTURES): VITD: 87.07 ng/mL (ref 30.00–100.00)

## 2021-03-27 LAB — HEMOGLOBIN A1C: Hgb A1c MFr Bld: 5.5 % (ref 4.6–6.5)

## 2021-03-27 LAB — TSH: TSH: 2.67 u[IU]/mL (ref 0.35–5.50)

## 2021-03-27 MED ORDER — BUSPIRONE HCL 5 MG PO TABS: 5.0000 mg | ORAL_TABLET | Freq: Two times a day (BID) | ORAL | 3 refills | Status: DC

## 2021-03-27 MED ORDER — SERTRALINE HCL 50 MG PO TABS: ORAL_TABLET | ORAL | 3 refills | Status: DC

## 2021-03-27 MED ORDER — BUPROPION HCL ER (XL) 300 MG PO TB24: 300.0000 mg | ORAL_TABLET | Freq: Every day | ORAL | 3 refills | Status: DC

## 2021-03-27 NOTE — Assessment & Plan Note (Signed)
No headaches since getting new glasses ?

## 2021-03-27 NOTE — Assessment & Plan Note (Signed)
Chronic ?On mounjaro ?Check insulin, a1c ?

## 2021-03-27 NOTE — Assessment & Plan Note (Signed)
Chronic Taking vitamin D daily Check vitamin D level  

## 2021-03-27 NOTE — Assessment & Plan Note (Addendum)
Chronic ?Controlled, Stable ?Continue BuSpar 5 mg 2 times daily or 10 mg daily, bupropion 300 mg daily, sertraline 50 mg daily ?

## 2021-03-28 ENCOUNTER — Encounter (INDEPENDENT_AMBULATORY_CARE_PROVIDER_SITE_OTHER): Payer: Self-pay | Admitting: Family Medicine

## 2021-03-28 ENCOUNTER — Ambulatory Visit (INDEPENDENT_AMBULATORY_CARE_PROVIDER_SITE_OTHER): Payer: BC Managed Care – PPO | Admitting: Family Medicine

## 2021-03-28 VITALS — BP 143/74 | HR 76 | Temp 98.0°F | Ht 66.0 in | Wt 161.0 lb

## 2021-03-28 DIAGNOSIS — E669 Obesity, unspecified: Secondary | ICD-10-CM

## 2021-03-28 DIAGNOSIS — Z6826 Body mass index (BMI) 26.0-26.9, adult: Secondary | ICD-10-CM

## 2021-03-28 DIAGNOSIS — E7849 Other hyperlipidemia: Secondary | ICD-10-CM | POA: Diagnosis not present

## 2021-03-28 DIAGNOSIS — R632 Polyphagia: Secondary | ICD-10-CM | POA: Diagnosis not present

## 2021-03-28 DIAGNOSIS — E559 Vitamin D deficiency, unspecified: Secondary | ICD-10-CM | POA: Diagnosis not present

## 2021-03-28 LAB — HEPATITIS C ANTIBODY
Hepatitis C Ab: NONREACTIVE
SIGNAL TO CUT-OFF: 0.02 (ref <1.00)

## 2021-03-28 LAB — INSULIN, RANDOM: Insulin: 4.4 u[IU]/mL

## 2021-03-28 MED ORDER — TIRZEPATIDE 7.5 MG/0.5ML ~~LOC~~ SOAJ: 7.5000 mg | SUBCUTANEOUS | 0 refills | Status: DC

## 2021-03-28 MED ORDER — VITAMIN D3 125 MCG (5000 UT) PO CAPS: 1.0000 | ORAL_CAPSULE | ORAL | Status: DC

## 2021-03-29 NOTE — Progress Notes (Signed)
Chief Complaint:   OBESITY Diana King is here to discuss her progress with her obesity treatment plan along with follow-up of her obesity related diagnoses. Diana King is on the Category 4 Plan and states she is following her eating plan approximately 30% of the time. Diana King states she is [employer] for 45 minutes 3 times per week.  Today's visit was #: 19 Starting weight: 207 lbs Starting date: 02/09/2020 Today's weight: 161 lbs Today's date: 03/28/2021 Total lbs lost to date: 46 lbs Total lbs lost since last in-office visit: 1 lb  Interim History: Diana King says she struggles yearly from December to March due to depression.  She got labs yesterday and noticed that her LDL was elevated.  She is trying to get herself off sugar and fried food.  She has prepped food for the next few days.  She has also been able to stick with [facility]  She cannot overeat on Mounjaro.  Subjective:   1. Other hyperlipidemia Lasasha's LDL was 134, triglycerides 68, HDL 63.1.  She is not on medication.  2. Vitamin D deficiency Her vitamin D level was 87.07.  She is taking 5000 IU daily.  3. Polyphagia Diana King is doing really well on Mounjaro.  She denies GI side effects.  Assessment/Plan:   1. Other hyperlipidemia Cardiovascular risk and specific lipid/LDL goals reviewed.  We discussed several lifestyle modifications today and Karly will continue to work on diet, exercise and weight loss efforts. Orders and follow up as documented in patient record. Repeat labs in July/August.  Counseling Intensive lifestyle modifications are the first line treatment for this issue. Dietary changes: Increase soluble fiber. Decrease simple carbohydrates. Exercise changes: Moderate to vigorous-intensity aerobic activity 150 minutes per week if tolerated. Lipid-lowering medications: see documented in medical record.  2. Vitamin D deficiency Low Vitamin D level contributes to fatigue and are associated with obesity,  breast, and colon cancer. She agrees to start taking her vitamin D every other day.  3. Polyphagia Intensive lifestyle modifications are the first line treatment for this issue. We discussed several lifestyle modifications today and she will continue to work on diet, exercise and weight loss efforts. Orders and follow up as documented in patient record.  Will refill Mounjaro 7.5 mg subcutaneously weekly.  Counseling Polyphagia is excessive hunger. Causes can include: low blood sugars, hypERthyroidism, PMS, lack of sleep, stress, insulin resistance, diabetes, certain medications, and diets that are deficient in protein and fiber.   - Refill tirzepatide (MOUNJARO) 7.5 MG/0.5ML Pen; Inject 7.5 mg into the skin once a week.  Dispense: 2 mL; Refill: 0  4. Obesity with current BMI of 26.1  Diana King is currently in the action stage of change. As such, her goal is to continue with weight loss efforts. She has agreed to the Category 4 Plan.   Exercise goals:  As is.  Behavioral modification strategies: increasing lean protein intake, meal planning and cooking strategies, keeping healthy foods in the home, and planning for success.  Diana King has agreed to follow-up with our clinic in 4 weeks. She was informed of the importance of frequent follow-up visits to maximize her success with intensive lifestyle modifications for her multiple health conditions.   Objective:   Blood pressure (!) 143/74, pulse 76, temperature 98 F (36.7 C), height 5\' 6"  (1.676 m), weight 161 lb (73 kg), last menstrual period 03/26/2021, SpO2 97 %. Body mass index is 25.99 kg/m.  General: Cooperative, alert, well developed, in no acute distress. HEENT: Conjunctivae  and lids unremarkable. Cardiovascular: Regular rhythm.  Lungs: Normal work of breathing. Neurologic: No focal deficits.   Lab Results  Component Value Date   CREATININE 0.95 03/27/2021   BUN 14 03/27/2021   NA 139 03/27/2021   K 4.0 03/27/2021   CL 102  03/27/2021   CO2 26 03/27/2021   Lab Results  Component Value Date   ALT 17 03/27/2021   AST 16 03/27/2021   ALKPHOS 40 03/27/2021   BILITOT 0.2 03/27/2021   Lab Results  Component Value Date   HGBA1C 5.5 03/27/2021   HGBA1C 5.6 07/10/2020   HGBA1C 5.5 02/09/2020   Lab Results  Component Value Date   INSULIN 13.4 07/10/2020   INSULIN 13.5 02/09/2020   Lab Results  Component Value Date   TSH 2.67 03/27/2021   Lab Results  Component Value Date   CHOL 211 (H) 03/27/2021   HDL 63.10 03/27/2021   LDLCALC 134 (H) 03/27/2021   TRIG 68.0 03/27/2021   CHOLHDL 3 03/27/2021   Lab Results  Component Value Date   VD25OH 87.07 03/27/2021   VD25OH 52.2 07/10/2020   VD25OH 25.5 (L) 02/09/2020   Lab Results  Component Value Date   WBC 8.2 03/27/2021   HGB 14.1 03/27/2021   HCT 42.6 03/27/2021   MCV 85.7 03/27/2021   PLT 391.0 03/27/2021   Attestation Statements:   Reviewed by clinician on day of visit: allergies, medications, problem list, medical history, surgical history, family history, social history, and previous encounter notes.  I, Insurance claims handler, CMA, am acting as transcriptionist for [name], MD. I have reviewed the above documentation for accuracy and completeness, and I agree with the above. - [name], MD

## 2021-03-30 ENCOUNTER — Other Ambulatory Visit: Payer: Self-pay | Admitting: Internal Medicine

## 2021-04-10 ENCOUNTER — Ambulatory Visit: Payer: Self-pay

## 2021-04-10 ENCOUNTER — Ambulatory Visit (INDEPENDENT_AMBULATORY_CARE_PROVIDER_SITE_OTHER): Payer: BC Managed Care – PPO | Admitting: Family Medicine

## 2021-04-10 ENCOUNTER — Other Ambulatory Visit: Payer: Self-pay

## 2021-04-10 ENCOUNTER — Encounter: Payer: Self-pay | Admitting: Family Medicine

## 2021-04-10 VITALS — BP 116/76 | HR 75 | Ht 66.0 in | Wt 161.0 lb

## 2021-04-10 DIAGNOSIS — M79671 Pain in right foot: Secondary | ICD-10-CM

## 2021-04-10 DIAGNOSIS — S93311A Subluxation of tarsal joint of right foot, initial encounter: Secondary | ICD-10-CM | POA: Diagnosis not present

## 2021-04-10 NOTE — Patient Instructions (Signed)
Rigid soled shoes ?Foot massage hourly at home ?Wear [place] ?See me around  ?Dierdre Searles, [employer] ?

## 2021-04-10 NOTE — Assessment & Plan Note (Signed)
Drop cuboid.  ?Discussed proper shoes, hep icing, bracing.  ?RTC in 3 weeks  ?

## 2021-04-10 NOTE — Progress Notes (Signed)
?Diana King D.O. ?[facility] ?[address] [place] [address] ?Phone: [phone] ?Subjective:   ? ?I'm seeing this patient by the request  of:  Pincus Sanes, MD ? ?CC: Right foot pain ? ?[id number]  ?Diana King is a 31 y.o. female coming in with complaint of R foot pain. Patient states that she continues to have pain on dorsum of R foot. Tried metatarsal pad that was uncomfortable. Using sock that had pad in it. Pain in all brands of shoes. Pain with weightbearing.  ? ?Now having L foot pain due to antalgic gait.  ? ?  ? ?Past Medical History:  ?Diagnosis Date  ? Acne   ? Anxiety   ? Back pain   ? Depression   ? GERD (gastroesophageal reflux disease)   ? IBS (irritable bowel syndrome)   ? Overweight   ? Pain in Achilles tendon   ? Palpitations   ? ?Past Surgical History:  ?Procedure Laterality Date  ? NO PAST SURGERIES    ? ?Social History  ? ?Socioeconomic History  ? Marital status: Married  ?  Spouse name: Amalia Hailey  ? Number of children: 1  ? Years of education: Not on file  ? Highest education level: Not on file  ?Occupational History  ? Occupation: CMA  ?Tobacco Use  ? Smoking status: Never  ? Smokeless tobacco: Never  ?Substance and Sexual Activity  ? Alcohol use: Yes  ?  Comment: social  ? Drug use: Not on file  ? Sexual activity: Not on file  ?Other Topics Concern  ? Not on file  ?Social History Narrative  ? Not on file  ? ?Social Determinants of Health  ? ?Financial Resource Strain: Not on file  ?Food Insecurity: Not on file  ?Transportation Needs: Not on file  ?Physical Activity: Not on file  ?Stress: Not on file  ?Social Connections: Not on file  ? ?Allergies  ?Allergen Reactions  ? Latex Swelling  ? ?Family History  ?Problem Relation Age of Onset  ? Fibromyalgia Mother   ? Interstitial cystitis Mother   ? Migraines Mother   ? Depression Mother   ? Anxiety disorder Mother   ? Alcohol abuse Maternal Aunt   ? Mental illness Maternal Aunt   ? Alcohol abuse Maternal Uncle   ?  Thyroid disease Maternal Grandmother   ? Hyperlipidemia Father   ? Alcohol abuse Father   ? ? ?Current Outpatient Medications (Endocrine & Metabolic):  ?  drospirenone-ethinyl estradiol (YAZ,GIANVI,LORYNA) 3-0.02 MG tablet, Take 1 tablet by mouth daily. ?  tirzepatide (MOUNJARO) 7.5 MG/0.5ML Pen, Inject 7.5 mg into the skin once a week. ? ?Current Outpatient Medications (Cardiovascular):  ?  spironolactone (ALDACTONE) 50 MG tablet, Take 50 mg by mouth daily. ? ? ? ? ?Current Outpatient Medications (Other):  ?  Adapalene 0.3 % gel, Apply 1 application topically at bedtime. ?  buPROPion (WELLBUTRIN XL) 300 MG 24 hr tablet, Take 1 tablet (300 mg total) by mouth daily. ?  busPIRone (BUSPAR) 5 MG tablet, Take 1 tablet (5 mg total) by mouth 2 (two) times daily. ?  Cholecalciferol (VITAMIN D3) 125 MCG (5000 UT) CAPS, Take 1 capsule (5,000 Units total) by mouth every other day. ?  clindamycin (CLEOCIN T) 1 % lotion, Apply topically at bedtime. ?  clindamycin (CLINDAGEL) 1 % gel, Apply topically 2 (two) times daily. ?  sertraline (ZOLOFT) 50 MG tablet, TAKE 1 TABLET BY MOUTH EVERYDAY AT BEDTIME ?  tiZANidine (ZANAFLEX) 4 MG tablet,  TAKE 1 TABLET BY MOUTH EVERYDAY AT BEDTIME ? ? ? ? ?Objective  ?Blood pressure 116/76, pulse 75, height 5\' 6"  (1.676 m), weight 161 lb (73 kg), last menstrual period 03/26/2021, SpO2 98 %. ?  ?General: No apparent distress alert and oriented x3 mood and affect normal, dressed appropriately.  ?HEENT: Pupils equal, extraocular movements intact  ?Respiratory: Patient's speak in full sentences and does not appear short of breath  ?Cardiovascular: No lower extremity edema, non tender, no erythema  ?Gait normal with good balance and coordination.  ?MSK: Right foot exam shows the patient is tender to palpation just distal to the cuboid bone but does have some pain on the cuboid as well.  Rigid midfoot noted. ? ? ?Limited muscular skeletal ultrasound was performed and interpreted by 05/26/2021, M   ?Limited ultrasound does not show any significant hypoechoic changes that would be consistent with a bursitis on the plantar aspect of the foot.  No cortical irregularity or hypoechoic changes of the joint space noted either. ?Impression: Fairly unremarkable foot. ?  ?Impression and Recommendations:  ?  ? ?The above documentation has been reviewed and is accurate and complete Antoine Primas, DO ? ? ? ?

## 2021-04-11 ENCOUNTER — Ambulatory Visit: Payer: BC Managed Care – PPO | Admitting: Family Medicine

## 2021-04-16 ENCOUNTER — Other Ambulatory Visit: Payer: Self-pay | Admitting: Family Medicine

## 2021-04-16 ENCOUNTER — Other Ambulatory Visit: Payer: Self-pay

## 2021-04-16 ENCOUNTER — Ambulatory Visit (INDEPENDENT_AMBULATORY_CARE_PROVIDER_SITE_OTHER): Payer: BC Managed Care – PPO

## 2021-04-16 DIAGNOSIS — M79671 Pain in right foot: Secondary | ICD-10-CM

## 2021-04-16 MED ORDER — PREDNISONE 20 MG PO TABS: 40.0000 mg | ORAL_TABLET | Freq: Every day | ORAL | 0 refills | Status: DC

## 2021-04-16 NOTE — Progress Notes (Signed)
Continue fot pain, gave prednisone to see how she does  ?

## 2021-04-25 ENCOUNTER — Ambulatory Visit (INDEPENDENT_AMBULATORY_CARE_PROVIDER_SITE_OTHER): Payer: BC Managed Care – PPO | Admitting: Family Medicine

## 2021-04-25 ENCOUNTER — Encounter (INDEPENDENT_AMBULATORY_CARE_PROVIDER_SITE_OTHER): Payer: Self-pay | Admitting: Family Medicine

## 2021-04-25 VITALS — BP 117/76 | HR 69 | Temp 97.6°F | Ht 66.0 in | Wt 159.0 lb

## 2021-04-25 DIAGNOSIS — R632 Polyphagia: Secondary | ICD-10-CM

## 2021-04-25 DIAGNOSIS — E669 Obesity, unspecified: Secondary | ICD-10-CM

## 2021-04-25 DIAGNOSIS — Z6825 Body mass index (BMI) 25.0-25.9, adult: Secondary | ICD-10-CM | POA: Diagnosis not present

## 2021-04-25 DIAGNOSIS — E559 Vitamin D deficiency, unspecified: Secondary | ICD-10-CM | POA: Diagnosis not present

## 2021-04-25 MED ORDER — TIRZEPATIDE 7.5 MG/0.5ML ~~LOC~~ SOAJ: 7.5000 mg | SUBCUTANEOUS | 0 refills | Status: DC

## 2021-04-27 NOTE — Progress Notes (Signed)
? ? ? ?Chief Complaint:  ? ?OBESITY ?Diana King is here to discuss her progress with her obesity treatment plan along with follow-up of her obesity related diagnoses. Nicholina is on the Category 4 Plan and states she is following her eating plan approximately 75% of the time. Jansen states she is doing HIIT for 45 minutes 2 times per week. ? ?Today's visit was #: 20 ?Starting weight: 207 lbs ?Starting date: 02/09/2020 ?Today's weight: 159 lbs ?Today's date: 04/25/2021 ?Total lbs lost to date: 71 ?Total lbs lost since last in-office visit: 2 ? ?Interim History: Caitlynd has been being more active with change in wether. She is remodeling her bathroom at home and also took course of  prednisone as she injured her foot. She has an increase in appetite while on prednisone. She did more significant food prep when dealing with foot injury and she has been able to be more on the plan.  ? ?Subjective:  ? ?1. Polyphagia ?Aleny is on Mounjaro 7.5 mg with good balance of craving control/satiety, and food intake.  ? ?2. Vitamin D deficiency ?Mckelle denies nausea, vomiting, or muscle weakness, but notes fatigue. Her Vitamin D level is of 87.07 at her PCP's office on 03/27/2021. ? ?Assessment/Plan:  ? ?1. Polyphagia ?Shanine will continue Mounjaro 7.5 mg SubQ weekly, and we will refill for 1 month. ? ?- tirzepatide (MOUNJARO) 7.5 MG/0.5ML Pen; Inject 7.5 mg into the skin once a week.  Dispense: 2 mL; Refill: 0 ? ?2. Vitamin D deficiency ?We will follow on Cathleen's labs and repeat in July.  ? ?3. Obesity with current BMI of 25.7 ?Thomasine is currently in the action stage of change. As such, her goal is to continue with weight loss efforts. She has agreed to the Category 4 Plan and keeping a food journal and adhering to recommended goals of 550-700 calories and 50+ grams of protein at supper daily.  ? ?Exercise goals: Hoping to get back to burn for both cardio and weight training days.  ? ?Behavioral modification strategies: increasing lean  protein intake, meal planning and cooking strategies, keeping healthy foods in the home, and planning for success. ? ?Noriah has agreed to follow-up with our clinic in 4 weeks. She was informed of the importance of frequent follow-up visits to maximize her success with intensive lifestyle modifications for her multiple health conditions.  ? ?Objective:  ? ?Blood pressure 117/76, pulse 69, temperature 97.6 ?F (36.4 ?C), height 5\' 6"  (1.676 m), weight 159 lb (72.1 kg), last menstrual period 03/26/2021, SpO2 97 %. ?Body mass index is 25.66 kg/m?. ? ?General: Cooperative, alert, well developed, in no acute distress. ?HEENT: Conjunctivae and lids unremarkable. ?Cardiovascular: Regular rhythm.  ?Lungs: Normal work of breathing. ?Neurologic: No focal deficits.  ? ?Lab Results  ?Component Value Date  ? CREATININE 0.95 03/27/2021  ? BUN 14 03/27/2021  ? NA 139 03/27/2021  ? K 4.0 03/27/2021  ? CL 102 03/27/2021  ? CO2 26 03/27/2021  ? ?Lab Results  ?Component Value Date  ? ALT 17 03/27/2021  ? AST 16 03/27/2021  ? ALKPHOS 40 03/27/2021  ? BILITOT 0.2 03/27/2021  ? ?Lab Results  ?Component Value Date  ? HGBA1C 5.5 03/27/2021  ? HGBA1C 5.6 07/10/2020  ? HGBA1C 5.5 02/09/2020  ? ?Lab Results  ?Component Value Date  ? INSULIN 13.4 07/10/2020  ? INSULIN 13.5 02/09/2020  ? ?Lab Results  ?Component Value Date  ? TSH 2.67 03/27/2021  ? ?Lab Results  ?Component Value Date  ? CHOL 211 (  H) 03/27/2021  ? HDL 63.10 03/27/2021  ? LDLCALC 134 (H) 03/27/2021  ? TRIG 68.0 03/27/2021  ? CHOLHDL 3 03/27/2021  ? ?Lab Results  ?Component Value Date  ? VD25OH 87.07 03/27/2021  ? VD25OH 52.2 07/10/2020  ? VD25OH 25.5 (L) 02/09/2020  ? ?Lab Results  ?Component Value Date  ? WBC 8.2 03/27/2021  ? HGB 14.1 03/27/2021  ? HCT 42.6 03/27/2021  ? MCV 85.7 03/27/2021  ? PLT 391.0 03/27/2021  ? ?No results found for: IRON, TIBC, FERRITIN ? ?Attestation Statements:  ? ?Reviewed by clinician on day of visit: allergies, medications, problem list, medical  history, surgical history, family history, social history, and previous encounter notes. ? ? ?I, Trixie Dredge, am acting as transcriptionist for Coralie Common, MD. ?I have reviewed the above documentation for accuracy and completeness, and I agree with the above. Coralie Common, MD ? ? ?

## 2021-05-21 ENCOUNTER — Ambulatory Visit (INDEPENDENT_AMBULATORY_CARE_PROVIDER_SITE_OTHER): Payer: BC Managed Care – PPO | Admitting: Family Medicine

## 2021-05-21 ENCOUNTER — Encounter (INDEPENDENT_AMBULATORY_CARE_PROVIDER_SITE_OTHER): Payer: Self-pay | Admitting: Family Medicine

## 2021-05-21 VITALS — BP 122/79 | HR 80 | Temp 97.5°F | Ht 66.0 in | Wt 159.0 lb

## 2021-05-21 DIAGNOSIS — E669 Obesity, unspecified: Secondary | ICD-10-CM | POA: Diagnosis not present

## 2021-05-21 DIAGNOSIS — R632 Polyphagia: Secondary | ICD-10-CM | POA: Diagnosis not present

## 2021-05-21 DIAGNOSIS — Z6825 Body mass index (BMI) 25.0-25.9, adult: Secondary | ICD-10-CM

## 2021-05-21 DIAGNOSIS — F419 Anxiety disorder, unspecified: Secondary | ICD-10-CM

## 2021-05-21 DIAGNOSIS — F32A Depression, unspecified: Secondary | ICD-10-CM

## 2021-05-21 DIAGNOSIS — Z9189 Other specified personal risk factors, not elsewhere classified: Secondary | ICD-10-CM

## 2021-05-21 MED ORDER — TIRZEPATIDE 7.5 MG/0.5ML ~~LOC~~ SOAJ: 7.5000 mg | SUBCUTANEOUS | 0 refills | Status: DC

## 2021-05-22 ENCOUNTER — Encounter: Payer: Self-pay | Admitting: Internal Medicine

## 2021-05-23 MED ORDER — SERTRALINE HCL 100 MG PO TABS: 100.0000 mg | ORAL_TABLET | Freq: Every day | ORAL | 3 refills | Status: DC

## 2021-05-23 NOTE — Progress Notes (Signed)
Yes, we can increase it to 100 mg daily - I sent [facility].  You may need it more than a month which is ok.   ? ?Let me know if you need anything.  ?

## 2021-06-01 NOTE — Progress Notes (Signed)
Chief Complaint:   OBESITY Diana King is here to discuss her progress with her obesity treatment plan along with follow-up of her obesity related diagnoses. Diana King is on the Category 4 Plan and keeping a food journal and adhering to recommended goals of 550-700 calories and 50+ grams of  protein and states she is following her eating plan approximately 70% of the time. Diana King states she is doing HIT and weights 45 minutes 3 times per week.  Today's visit was #: 21 Starting weight: 207 lbs Starting date: 02/09/2020 Today's weight: 159 lbs Today's date: 05/21/2021 Total lbs lost to date: 48 Total lbs lost since last in-office visit: 0  Interim History: Diana King enjoyed a week without her husband and daughter and got on track. Family having significant medical issues (M,D, MIL, daughter). Work has been busy too. She is not anticipating much of any changes with daughters anxiety. She is going to grocery store today.  Subjective:   1. Polyphagia Diana King is doing well on Mounjaro. Denies any GI side effects.  2. Anxiety and depression Some increase in anxiety and depression. Denies suicidal ideas, and homicidal ideas.  3. At risk for anxiety Diana King is at risk of developing anxiety due to familial health issues.  Assessment/Plan:   1. Polyphagia We will refill Mounjaro 7.5 mg subcutaneous, weekly for 1 month with no refills.  -Refill tirzepatide (MOUNJARO) 7.5 MG/0.5ML Pen; Inject 7.5 mg into the skin once a week.  Dispense: 2 mL; Refill: 0  2. Anxiety and depression Diana King may reach out to PCP if anxiety increases to increase Zoloft to 75 mg.  3. At risk for anxiety Diana King was given approximately 15 minutes of anxiety risk counseling today. She has risk factors for anxiety. We discussed the importance of a healthy work life balance, a healthy relationship with food and a good support system.  Repetitive spaced learning was employed today to elicit superior memory formation and  behavioral change.   4. Obesity with current BMI of 25.7 Diana King is currently in the action stage of change. As such, her goal is to continue with weight loss efforts. She has agreed to the Category 4 Plan and keeping a food journal and adhering to recommended goals of 550-700 calories and 50+ grams of protein at supper.  Exercise goals: All adults should avoid inactivity. Some physical activity is better than none, and adults who participate in any amount of physical activity gain some health benefits.  Behavioral modification strategies: increasing lean protein intake, meal planning and cooking strategies, and keeping healthy foods in the home.  Diana King has agreed to follow-up with our clinic in 4 weeks. She was informed of the importance of frequent follow-up visits to maximize her success with intensive lifestyle modifications for her multiple health conditions.   Objective:   Blood pressure 122/79, pulse 80, temperature (!) 97.5 F (36.4 C), height 5\' 6"  (1.676 m), weight 159 lb (72.1 kg), last menstrual period 05/21/2021, SpO2 98 %. Body mass index is 25.66 kg/m.  General: Cooperative, alert, well developed, in no acute distress. HEENT: Conjunctivae and lids unremarkable. Cardiovascular: Regular rhythm.  Lungs: Normal work of breathing. Neurologic: No focal deficits.   Lab Results  Component Value Date   CREATININE 0.95 03/27/2021   BUN 14 03/27/2021   NA 139 03/27/2021   K 4.0 03/27/2021   CL 102 03/27/2021   CO2 26 03/27/2021   Lab Results  Component Value Date   ALT 17 03/27/2021   AST 16 03/27/2021  ALKPHOS 40 03/27/2021   BILITOT 0.2 03/27/2021   Lab Results  Component Value Date   HGBA1C 5.5 03/27/2021   HGBA1C 5.6 07/10/2020   HGBA1C 5.5 02/09/2020   Lab Results  Component Value Date   INSULIN 13.4 07/10/2020   INSULIN 13.5 02/09/2020   Lab Results  Component Value Date   TSH 2.67 03/27/2021   Lab Results  Component Value Date   CHOL 211 (H)  03/27/2021   HDL 63.10 03/27/2021   LDLCALC 134 (H) 03/27/2021   TRIG 68.0 03/27/2021   CHOLHDL 3 03/27/2021   Lab Results  Component Value Date   VD25OH 87.07 03/27/2021   VD25OH 52.2 07/10/2020   VD25OH 25.5 (L) 02/09/2020   Lab Results  Component Value Date   WBC 8.2 03/27/2021   HGB 14.1 03/27/2021   HCT 42.6 03/27/2021   MCV 85.7 03/27/2021   PLT 391.0 03/27/2021   No results found for: IRON, TIBC, FERRITIN  Attestation Statements:   Reviewed by clinician on day of visit: allergies, medications, problem list, medical history, surgical history, family history, social history, and previous encounter notes.  I, Fortino Sic, RMA am acting as transcriptionist for [name], MD.  I have reviewed the above documentation for accuracy and completeness, and I agree with the above. - [name], MD

## 2021-06-14 DIAGNOSIS — Z30011 Encounter for initial prescription of contraceptive pills: Secondary | ICD-10-CM | POA: Diagnosis not present

## 2021-06-14 DIAGNOSIS — Z6827 Body mass index (BMI) 27.0-27.9, adult: Secondary | ICD-10-CM | POA: Diagnosis not present

## 2021-06-14 DIAGNOSIS — Z01419 Encounter for gynecological examination (general) (routine) without abnormal findings: Secondary | ICD-10-CM | POA: Diagnosis not present

## 2021-06-19 ENCOUNTER — Ambulatory Visit (INDEPENDENT_AMBULATORY_CARE_PROVIDER_SITE_OTHER): Payer: BC Managed Care – PPO | Admitting: Family Medicine

## 2021-07-17 ENCOUNTER — Encounter (INDEPENDENT_AMBULATORY_CARE_PROVIDER_SITE_OTHER): Payer: Self-pay | Admitting: Family Medicine

## 2021-07-17 ENCOUNTER — Ambulatory Visit (INDEPENDENT_AMBULATORY_CARE_PROVIDER_SITE_OTHER): Payer: BC Managed Care – PPO | Admitting: Family Medicine

## 2021-07-17 VITALS — BP 125/75 | HR 83 | Temp 98.1°F | Ht 66.0 in | Wt 162.0 lb

## 2021-07-17 DIAGNOSIS — F419 Anxiety disorder, unspecified: Secondary | ICD-10-CM

## 2021-07-17 DIAGNOSIS — E669 Obesity, unspecified: Secondary | ICD-10-CM | POA: Diagnosis not present

## 2021-07-17 DIAGNOSIS — F32A Depression, unspecified: Secondary | ICD-10-CM

## 2021-07-17 DIAGNOSIS — R632 Polyphagia: Secondary | ICD-10-CM | POA: Diagnosis not present

## 2021-07-17 DIAGNOSIS — Z6826 Body mass index (BMI) 26.0-26.9, adult: Secondary | ICD-10-CM

## 2021-07-17 MED ORDER — TIRZEPATIDE 7.5 MG/0.5ML ~~LOC~~ SOAJ: 7.5000 mg | SUBCUTANEOUS | 0 refills | Status: DC

## 2021-07-18 NOTE — Progress Notes (Signed)
Chief Complaint:   OBESITY Caleen is here to discuss her progress with her obesity treatment plan along with follow-up of her obesity related diagnoses. Renella is on the Category 4 Plan and states she is following her eating plan approximately 50% of the time. Nimrat states she is doing Burn HIT 45 minutes 2 times per week.  Today's visit was #: 22 Starting weight: 207 lbs Starting date: 02/09/2020 Today's weight: 162 lbs Today's date: 07/17/2021 Total lbs lost to date: 45 lbs Total lbs lost since last in-office visit: 0  Interim History: Latiffany voices some stressors at home and difficulty with compliance to plan. Work has also been stressful. She's not sure what her difficulty is. She has struggled with motivation and voices she knows what she needs to do but can not do it.  Subjective:   1. Polyphagia Maury is currently on Mounjaro 7.5 mg weekly. Her coupon code will expire 07/20/21.  2. Anxiety and depression Wanda is currently taking Wellbutrin, Buspar and Zoloft. Did increase to 150 mg Zoloft daily.  Assessment/Plan:   1. Polyphagia We will refill Mounjaro 7.5 mg SubQ once weekly for 41month with 0 refills.  -Refill tirzepatide (MOUNJARO) 7.5 MG/0.5ML Pen; Inject 7.5 mg into the skin once a week.  Dispense: 2 mL; Refill: 0  2. Anxiety and depression Lavell will follow up with Dr. Myrtie Hawk with current medications.  3. Obesity with current BMI of 26.1 Kaijah is currently in the action stage of change. As such, her goal is to continue with weight loss efforts. She has agreed to the Category 4 Plan.   Exercise goals: As is.  Behavioral modification strategies: increasing lean protein intake, meal planning and cooking strategies, keeping healthy foods in the home, and planning for success.  Aspynn has agreed to follow-up with our clinic in 3 weeks. She was informed of the importance of frequent follow-up visits to maximize her success with intensive lifestyle  modifications for her multiple health conditions.   Objective:   Blood pressure 125/75, pulse 83, temperature 98.1 F (36.7 C), height 5\' 6"  (1.676 m), weight 162 lb (73.5 kg), SpO2 99 %. Body mass index is 26.15 kg/m.  General: Cooperative, alert, well developed, in no acute distress. HEENT: Conjunctivae and lids unremarkable. Cardiovascular: Regular rhythm.  Lungs: Normal work of breathing. Neurologic: No focal deficits.   Lab Results  Component Value Date   CREATININE 0.95 03/27/2021   BUN 14 03/27/2021   NA 139 03/27/2021   K 4.0 03/27/2021   CL 102 03/27/2021   CO2 26 03/27/2021   Lab Results  Component Value Date   ALT 17 03/27/2021   AST 16 03/27/2021   ALKPHOS 40 03/27/2021   BILITOT 0.2 03/27/2021   Lab Results  Component Value Date   HGBA1C 5.5 03/27/2021   HGBA1C 5.6 07/10/2020   HGBA1C 5.5 02/09/2020   Lab Results  Component Value Date   INSULIN 13.4 07/10/2020   INSULIN 13.5 02/09/2020   Lab Results  Component Value Date   TSH 2.67 03/27/2021   Lab Results  Component Value Date   CHOL 211 (H) 03/27/2021   HDL 63.10 03/27/2021   LDLCALC 134 (H) 03/27/2021   TRIG 68.0 03/27/2021   CHOLHDL 3 03/27/2021   Lab Results  Component Value Date   VD25OH 87.07 03/27/2021   VD25OH 52.2 07/10/2020   VD25OH 25.5 (L) 02/09/2020   Lab Results  Component Value Date   WBC 8.2 03/27/2021   HGB 14.1 03/27/2021  HCT 42.6 03/27/2021   MCV 85.7 03/27/2021   PLT 391.0 03/27/2021   No results found for: "IRON", "TIBC", "FERRITIN"  Attestation Statements:   Reviewed by clinician on day of visit: allergies, medications, problem list, medical history, surgical history, family history, social history, and previous encounter notes.  I, Fortino Sic, RMA am acting as transcriptionist for [name], MD.  I have reviewed the above documentation for accuracy and completeness, and I agree with the above. - [name], MD

## 2021-08-15 ENCOUNTER — Ambulatory Visit (INDEPENDENT_AMBULATORY_CARE_PROVIDER_SITE_OTHER): Payer: BC Managed Care – PPO | Admitting: Family Medicine

## 2021-08-15 VITALS — BP 118/76 | HR 79 | Temp 98.6°F | Ht 66.0 in | Wt 172.0 lb

## 2021-08-15 DIAGNOSIS — F439 Reaction to severe stress, unspecified: Secondary | ICD-10-CM | POA: Diagnosis not present

## 2021-08-15 DIAGNOSIS — Z6827 Body mass index (BMI) 27.0-27.9, adult: Secondary | ICD-10-CM

## 2021-08-15 DIAGNOSIS — E669 Obesity, unspecified: Secondary | ICD-10-CM | POA: Diagnosis not present

## 2021-08-19 ENCOUNTER — Encounter: Payer: Self-pay | Admitting: Internal Medicine

## 2021-08-19 NOTE — Progress Notes (Unsigned)
Subjective:    Patient ID: Diana King, female    DOB: 23-Jun-1990, 31 y.o.   MRN: [id number]      HPI Diana King is here for  Chief Complaint  Patient presents with   Anxiety    Having a lot of increased anxiety and depression.  Her daughter has increased anxiety and also had an incident at school last year that is causing a lot of anxiety for her daughter and herself.  She denies that she is not sleeping well, having headaches, getting episodes of feeling very hot, and shaking, heart racing-all of this can ultimately lead to more severe depression which has not happened yet but she is concerned about.  She is eating more and has gained weight after she lost all the weight.  She is not exercising, but started today and is going to try to maintain regular exercise because that does help.  She did try increasing the sertraline initially to 200 mg daily but that caused shakes and tremors and she decreased it to 150 and took that for a month and there is no change in her depression and anxiety.  She did increase her BuSpar to 3 times daily, but that was not much change either.   Doing EAP for therapy.      Medications and allergies reviewed with patient and updated if appropriate.  Current Outpatient Medications on File Prior to Visit  Medication Sig Dispense Refill   Adapalene 0.3 % gel Apply 1 application topically at bedtime. 45 g 5   buPROPion (WELLBUTRIN XL) 300 MG 24 hr tablet Take 1 tablet (300 mg total) by mouth daily. 90 tablet 3   busPIRone (BUSPAR) 5 MG tablet Take 1 tablet (5 mg total) by mouth 2 (two) times daily. 180 tablet 3   Cholecalciferol (VITAMIN D3) 125 MCG (5000 UT) CAPS Take 1 capsule (5,000 Units total) by mouth every other day. 30 capsule    clindamycin (CLINDAGEL) 1 % gel Apply topically 2 (two) times daily.     drospirenone-ethinyl estradiol (YAZ,GIANVI,LORYNA) 3-0.02 MG tablet Take 1 tablet by mouth daily.     sertraline (ZOLOFT) 100 MG tablet Take 1  tablet (100 mg total) by mouth daily. 30 tablet 3   spironolactone (ALDACTONE) 50 MG tablet Take 50 mg by mouth daily.     tiZANidine (ZANAFLEX) 4 MG tablet TAKE 1 TABLET BY MOUTH EVERYDAY AT BEDTIME 90 tablet 1   No current facility-administered medications on file prior to visit.    Review of Systems  Constitutional:  Positive for appetite change.  Respiratory:  Negative for shortness of breath.   Cardiovascular:  Negative for chest pain and palpitations (heart racing).  Neurological:  Positive for headaches.  Psychiatric/Behavioral:  Positive for sleep disturbance.        Objective:   Vitals:   08/20/21 0749  BP: 128/74  Pulse: (!) 102  Temp: 98.1 F (36.7 C)  SpO2: 98%   BP Readings from Last 3 Encounters:  08/20/21 128/74  08/15/21 118/76  07/17/21 125/75   Wt Readings from Last 3 Encounters:  08/20/21 172 lb (78 kg)  08/15/21 172 lb (78 kg)  07/17/21 162 lb (73.5 kg)   Body mass index is 27.76 kg/m.    Physical Exam Constitutional:      Appearance: Normal appearance.  HENT:     Head: Normocephalic and atraumatic.  Skin:    General: Skin is warm and dry.  Neurological:     Mental Status: She is  alert.  Psychiatric:     Comments: Mildly anxious mood            Assessment & Plan:    See Problem List for Assessment and Plan of chronic medical problems.

## 2021-08-20 ENCOUNTER — Ambulatory Visit: Payer: BC Managed Care – PPO | Admitting: Internal Medicine

## 2021-08-20 VITALS — BP 128/74 | HR 102 | Temp 98.1°F | Wt 172.0 lb

## 2021-08-20 DIAGNOSIS — F32A Depression, unspecified: Secondary | ICD-10-CM

## 2021-08-20 DIAGNOSIS — F419 Anxiety disorder, unspecified: Secondary | ICD-10-CM

## 2021-08-20 MED ORDER — CITALOPRAM HYDROBROMIDE 20 MG PO TABS: 20.0000 mg | ORAL_TABLET | Freq: Every day | ORAL | 5 refills | Status: DC

## 2021-08-20 MED ORDER — BUSPIRONE HCL 5 MG PO TABS: 20.0000 mg | ORAL_TABLET | Freq: Two times a day (BID) | ORAL | 3 refills | Status: DC

## 2021-08-20 NOTE — Assessment & Plan Note (Addendum)
Chronic Baseline has some anxiety and depression, but this is worse under current circumstances-mostly was going on with her daughter She is having several physical symptoms that go along with increased anxiety and may be some depression Discussed medication options Continue Wellbutrin XL 300 mg daily Taper off sertraline Then start Celexa 20 mg daily Increase BuSpar to 20 mg twice daily Has started seeing a therapist Back to exercising and will continue that Can try journaling, meditation Follow-up in 4 weeks, sooner if needed

## 2021-08-20 NOTE — Patient Instructions (Addendum)
     Medications changes include :   decrease sertraline for 50 mg x 1 week then stop if you have no side effects.  Start celexa 20 mg daily  Increase buspar to 20 mg twice a day.     Your prescription(s) have been sent to your pharmacy.    Return in about 4 weeks (around 09/17/2021) for follow up.

## 2021-08-20 NOTE — Progress Notes (Signed)
Chief Complaint:   OBESITY Diana King is here to discuss her progress with her obesity treatment plan along with follow-up of her obesity related diagnoses. Diana King is on the Category 4 Plan and states she is following her eating plan approximately 20% of the time. Diana King states she is walking 45 minutes 3 times per week.  Today's visit was #: 23 Starting weight: 207 lbs Starting date: 02/09/2020 Today's weight: 172 lbs Today's date: 08/15/2021 Total lbs lost to date: 35 lbs Total lbs lost since last in-office visit: 10  Interim History: Diana King has had a very stressful last few weeks-- started seeing a therapist and has been bringing her daughter to numerous doctors appointments. She realizes when she eats the amount of protein she needs to, she is not  as hungry. She did try to make small lists of things to do.  Subjective:   1. Stress at home Diana King is dealing with significant stress with her daughter. Denies suicidal ideas, and homicidal ideas. She is currently on Zoloft, Buspar and Wellbutrin.  Assessment/Plan:   1. Stress at home Ellory will continue taking current medications without any changes in dose.  2. Obesity with current BMI of 27.8 Diana King is currently in the action stage of change. As such, her goal is to continue with weight loss efforts. She has agreed to the Category 4 Plan.   Exercise goals: As is.  Behavioral modification strategies: increasing lean protein intake, decreasing eating out, meal planning and cooking strategies, keeping healthy foods in the home, and emotional eating strategies.  Diana King has agreed to follow-up with our clinic in 4 weeks. She was informed of the importance of frequent follow-up visits to maximize her success with intensive lifestyle modifications for her multiple health conditions.   Objective:   Blood pressure 118/76, pulse 79, temperature 98.6 F (37 C), height 5\' 6"  (1.676 m), weight 172 lb (78 kg), last menstrual period  07/21/2021, SpO2 97 %. Body mass index is 27.76 kg/m.  General: Cooperative, alert, well developed, in no acute distress. HEENT: Conjunctivae and lids unremarkable. Cardiovascular: Regular rhythm.  Lungs: Normal work of breathing. Neurologic: No focal deficits.   Lab Results  Component Value Date   CREATININE 0.95 03/27/2021   BUN 14 03/27/2021   NA 139 03/27/2021   K 4.0 03/27/2021   CL 102 03/27/2021   CO2 26 03/27/2021   Lab Results  Component Value Date   ALT 17 03/27/2021   AST 16 03/27/2021   ALKPHOS 40 03/27/2021   BILITOT 0.2 03/27/2021   Lab Results  Component Value Date   HGBA1C 5.5 03/27/2021   HGBA1C 5.6 07/10/2020   HGBA1C 5.5 02/09/2020   Lab Results  Component Value Date   INSULIN 13.4 07/10/2020   INSULIN 13.5 02/09/2020   Lab Results  Component Value Date   TSH 2.67 03/27/2021   Lab Results  Component Value Date   CHOL 211 (H) 03/27/2021   HDL 63.10 03/27/2021   LDLCALC 134 (H) 03/27/2021   TRIG 68.0 03/27/2021   CHOLHDL 3 03/27/2021   Lab Results  Component Value Date   VD25OH 87.07 03/27/2021   VD25OH 52.2 07/10/2020   VD25OH 25.5 (L) 02/09/2020   Lab Results  Component Value Date   WBC 8.2 03/27/2021   HGB 14.1 03/27/2021   HCT 42.6 03/27/2021   MCV 85.7 03/27/2021   PLT 391.0 03/27/2021   No results found for: "IRON", "TIBC", "FERRITIN"  Attestation Statements:   Reviewed by clinician on day  of visit: allergies, medications, problem list, medical history, surgical history, family history, social history, and previous encounter notes.  I, Elnora Morrison, RMA am acting as transcriptionist for Coralie Common, MD.  I have reviewed the above documentation for accuracy and completeness, and I agree with the above. - Coralie Common, MD

## 2021-08-29 ENCOUNTER — Encounter (INDEPENDENT_AMBULATORY_CARE_PROVIDER_SITE_OTHER): Payer: Self-pay

## 2021-09-03 ENCOUNTER — Ambulatory Visit (INDEPENDENT_AMBULATORY_CARE_PROVIDER_SITE_OTHER): Payer: BC Managed Care – PPO | Admitting: Family Medicine

## 2021-09-03 ENCOUNTER — Other Ambulatory Visit: Payer: Self-pay

## 2021-09-03 ENCOUNTER — Ambulatory Visit: Payer: Self-pay

## 2021-09-03 ENCOUNTER — Ambulatory Visit
Admission: RE | Admit: 2021-09-03 | Discharge: 2021-09-03 | Disposition: A | Discharge status: Home / Self Care | Payer: BC Managed Care – PPO | Source: Ambulatory Visit | Attending: Family Medicine | Admitting: Family Medicine

## 2021-09-03 VITALS — Ht 66.0 in

## 2021-09-03 DIAGNOSIS — M2550 Pain in unspecified joint: Secondary | ICD-10-CM

## 2021-09-03 DIAGNOSIS — M79671 Pain in right foot: Secondary | ICD-10-CM

## 2021-09-03 DIAGNOSIS — K829 Disease of gallbladder, unspecified: Secondary | ICD-10-CM

## 2021-09-03 DIAGNOSIS — K8020 Calculus of gallbladder without cholecystitis without obstruction: Secondary | ICD-10-CM | POA: Diagnosis not present

## 2021-09-03 DIAGNOSIS — R197 Diarrhea, unspecified: Secondary | ICD-10-CM | POA: Diagnosis not present

## 2021-09-03 DIAGNOSIS — G8929 Other chronic pain: Secondary | ICD-10-CM | POA: Diagnosis not present

## 2021-09-03 DIAGNOSIS — R1011 Right upper quadrant pain: Secondary | ICD-10-CM | POA: Diagnosis not present

## 2021-09-03 DIAGNOSIS — R110 Nausea: Secondary | ICD-10-CM | POA: Diagnosis not present

## 2021-09-03 LAB — LACTATE DEHYDROGENASE: LDH: 94 U/L — ABNORMAL LOW (ref 100–200)

## 2021-09-03 LAB — SEDIMENTATION RATE: Sed Rate: 16 mm/hr (ref 0–20)

## 2021-09-03 LAB — CBC WITH DIFFERENTIAL/PLATELET
Basophils Absolute: 0.1 10*3/uL (ref 0.0–0.1)
Basophils Relative: 1 % (ref 0.0–3.0)
Eosinophils Absolute: 0.1 10*3/uL (ref 0.0–0.7)
Eosinophils Relative: 1.6 % (ref 0.0–5.0)
HCT: 42 % (ref 36.0–46.0)
Hemoglobin: 13.7 g/dL (ref 12.0–15.0)
Lymphocytes Relative: 44.2 % (ref 12.0–46.0)
Lymphs Abs: 3.8 10*3/uL (ref 0.7–4.0)
MCHC: 32.5 g/dL (ref 30.0–36.0)
MCV: 87.7 fl (ref 78.0–100.0)
Monocytes Absolute: 0.8 10*3/uL (ref 0.1–1.0)
Monocytes Relative: 9.2 % (ref 3.0–12.0)
Neutro Abs: 3.8 10*3/uL (ref 1.4–7.7)
Neutrophils Relative %: 44 % (ref 43.0–77.0)
Platelets: 398 10*3/uL (ref 150.0–400.0)
RBC: 4.79 Mil/uL (ref 3.87–5.11)
RDW: 13.2 % (ref 11.5–15.5)
WBC: 8.7 10*3/uL (ref 4.0–10.5)

## 2021-09-03 LAB — GAMMA GT: GGT: 13 U/L (ref 7–51)

## 2021-09-03 LAB — COMPREHENSIVE METABOLIC PANEL
ALT: 10 U/L (ref 0–35)
AST: 14 U/L (ref 0–37)
Albumin: 4.2 g/dL (ref 3.5–5.2)
Alkaline Phosphatase: 29 U/L — ABNORMAL LOW (ref 39–117)
BUN: 15 mg/dL (ref 6–23)
CO2: 25 mEq/L (ref 19–32)
Calcium: 9.2 mg/dL (ref 8.4–10.5)
Chloride: 102 mEq/L (ref 96–112)
Creatinine, Ser: 0.91 mg/dL (ref 0.40–1.20)
GFR: 82.9 mL/min (ref >60.00)
Glucose, Bld: 85 mg/dL (ref 70–99)
Potassium: 4.3 mEq/L (ref 3.5–5.1)
Sodium: 137 mEq/L (ref 135–145)
Total Bilirubin: 0.3 mg/dL (ref 0.2–1.2)
Total Protein: 7.3 g/dL (ref 6.0–8.3)

## 2021-09-03 LAB — LIPASE: Lipase: 43 U/L (ref 11.0–59.0)

## 2021-09-03 NOTE — Assessment & Plan Note (Signed)
Right upper quadrant pain intermittently noted today.  Patient does have a mild positive Murphy sign.  Quick ultrasound does show some abnormality noted of the enlargement of the gallbladder that is concerning.  Did we will get a true ultrasound to further evaluate.  Did also look at the liver echotexture.  We will get laboratory work-up to make sure there is no infectious etiology.  Depending on findings we will discuss further.  If it does show gallbladder disease will refer to general surgery to discuss further.

## 2021-09-03 NOTE — Progress Notes (Signed)
Diana King [address] [place] [address] Phone: [phone] Subjective:    I'm seeing this patient by the request  of:  Pincus Sanes, MD  CC: right upper quadrant pain   IRJ:[id number]  Diana King is a 31 y.o. female coming in with complaint of abdominal pain.  Right upper quadrant.  Been intermittent for some time but does not seem to be improving.  Patient states that it is severe.  Waking her up at night, now associated with shock colored diarrhea.  Has not noticed any specific association with specific foods.  Denies any fevers, chills, any blood in stools or urine.  No rash in the area.  No chest pain does have some radiation to the low back.       Past Medical History:  Diagnosis Date   Acne    Anxiety    Back pain    Depression    GERD (gastroesophageal reflux disease)    IBS (irritable bowel syndrome)    Overweight    Pain in Achilles tendon    Palpitations    Past Surgical History:  Procedure Laterality Date   NO PAST SURGERIES     Social History   Socioeconomic History   Marital status: Married    Spouse name: Dustin   Number of children: 1   Years of education: Not on file   Highest education level: Not on file  Occupational History   Occupation: CMA  Tobacco Use   Smoking status: Never   Smokeless tobacco: Never  Substance and Sexual Activity   Alcohol use: Yes    Comment: social   Drug use: Not on file   Sexual activity: Not on file  Other Topics Concern   Not on file  Social History Narrative   Not on file   Social Determinants of Health   Financial Resource Strain: Not on file  Food Insecurity: Not on file  Transportation Needs: Not on file  Physical Activity: Not on file  Stress: Not on file  Social Connections: Not on file   Allergies  Allergen Reactions   Latex Swelling   Gabapentin    Family History  Problem Relation Age of Onset   Fibromyalgia Mother    Interstitial cystitis  Mother    Migraines Mother    Depression Mother    Anxiety disorder Mother    Alcohol abuse Maternal Aunt    Mental illness Maternal Aunt    Alcohol abuse Maternal Uncle    Thyroid disease Maternal Grandmother    Hyperlipidemia Father    Alcohol abuse Father     Current Outpatient Medications (Endocrine & Metabolic):    drospirenone-ethinyl estradiol (YAZ,GIANVI,LORYNA) 3-0.02 MG tablet, Take 1 tablet by mouth daily.  Current Outpatient Medications (Cardiovascular):    spironolactone (ALDACTONE) 50 MG tablet, Take 50 mg by mouth daily.     Current Outpatient Medications (Other):    Adapalene 0.3 % gel, Apply 1 application topically at bedtime.   buPROPion (WELLBUTRIN XL) 300 MG 24 hr tablet, Take 1 tablet (300 mg total) by mouth daily.   busPIRone (BUSPAR) 5 MG tablet, Take 4 tablets (20 mg total) by mouth 2 (two) times daily.   Cholecalciferol (VITAMIN D3) 125 MCG (5000 UT) CAPS, Take 1 capsule (5,000 Units total) by mouth every other day.   citalopram (CELEXA) 20 MG tablet, Take 1 tablet (20 mg total) by mouth daily.   clindamycin (CLINDAGEL) 1 % gel, Apply topically 2 (two) times  daily.   tiZANidine (ZANAFLEX) 4 MG tablet, TAKE 1 TABLET BY MOUTH EVERYDAY AT BEDTIME   Reviewed prior external information including notes and imaging from  primary care provider As well as notes that were available from care everywhere and other healthcare systems.  Past medical history, social, surgical and family history all reviewed in electronic medical record.  No pertanent information unless stated regarding to the chief complaint.   Review of Systems:  No headache, visual changes,  vomiting,  constipation, dizziness, skin rash, fevers, chills, night sweats, weight loss, swollen lymph nodes, body aches, joint swelling, chest pain, shortness of breath, mood changes. POSITIVE muscle aches, body aches, abdominal pain, diarrhea, nausea  Objective  Height 5\' 6"  (1.676 m), last menstrual period  07/21/2021.   General: No apparent distress alert and oriented x3 mood and affect normal, dressed appropriately.  HEENT: Pupils equal, extraocular movements intact  Respiratory: Patient's speak in full sentences and does not appear short of breath  Cardiovascular: No lower extremity edema, non tender, no erythema  Abdominal exam shows the patient is severely tender with positive Murphy sign noted.  No masses appreciated.  Patient is nontender in the epigastric or the right lower quadrant.  Patient though does have some involuntary guarding noted to the right upper quadrant.    Impression and Recommendations:    The above documentation has been reviewed and is accurate and complete 09/21/2021, DO

## 2021-09-05 DIAGNOSIS — K8010 Calculus of gallbladder with chronic cholecystitis without obstruction: Secondary | ICD-10-CM | POA: Diagnosis not present

## 2021-09-06 ENCOUNTER — Encounter: Payer: Self-pay | Admitting: Internal Medicine

## 2021-09-06 ENCOUNTER — Encounter (INDEPENDENT_AMBULATORY_CARE_PROVIDER_SITE_OTHER): Payer: Self-pay | Admitting: Family Medicine

## 2021-09-06 NOTE — Telephone Encounter (Signed)
Please advise 

## 2021-09-11 ENCOUNTER — Other Ambulatory Visit: Payer: Self-pay | Admitting: Internal Medicine

## 2021-09-11 ENCOUNTER — Other Ambulatory Visit: Payer: Self-pay | Admitting: Family Medicine

## 2021-09-11 NOTE — Telephone Encounter (Signed)
Please advise 

## 2021-09-12 ENCOUNTER — Ambulatory Visit (INDEPENDENT_AMBULATORY_CARE_PROVIDER_SITE_OTHER): Payer: BC Managed Care – PPO | Admitting: Family Medicine

## 2021-09-12 ENCOUNTER — Encounter (INDEPENDENT_AMBULATORY_CARE_PROVIDER_SITE_OTHER): Payer: Self-pay | Admitting: Family Medicine

## 2021-09-12 VITALS — BP 132/82 | HR 84 | Temp 98.2°F | Ht 66.0 in | Wt 176.0 lb

## 2021-09-12 DIAGNOSIS — F4522 Body dysmorphic disorder: Secondary | ICD-10-CM

## 2021-09-12 DIAGNOSIS — Z6828 Body mass index (BMI) 28.0-28.9, adult: Secondary | ICD-10-CM

## 2021-09-12 DIAGNOSIS — R632 Polyphagia: Secondary | ICD-10-CM | POA: Diagnosis not present

## 2021-09-12 DIAGNOSIS — E669 Obesity, unspecified: Secondary | ICD-10-CM | POA: Diagnosis not present

## 2021-09-12 MED ORDER — LOMAIRA 8 MG PO TABS: 8.0000 mg | ORAL_TABLET | Freq: Every morning | ORAL | 0 refills | Status: DC

## 2021-09-12 MED ORDER — TOPIRAMATE 25 MG PO TABS: 50.0000 mg | ORAL_TABLET | Freq: Every day | ORAL | 0 refills | Status: DC

## 2021-09-18 ENCOUNTER — Encounter (INDEPENDENT_AMBULATORY_CARE_PROVIDER_SITE_OTHER): Payer: Self-pay | Admitting: Family Medicine

## 2021-09-18 DIAGNOSIS — K811 Chronic cholecystitis: Secondary | ICD-10-CM | POA: Diagnosis not present

## 2021-09-18 DIAGNOSIS — K8010 Calculus of gallbladder with chronic cholecystitis without obstruction: Secondary | ICD-10-CM | POA: Diagnosis not present

## 2021-09-19 NOTE — Telephone Encounter (Signed)
FYI

## 2021-09-23 NOTE — Progress Notes (Signed)
Chief Complaint:   OBESITY Diana King is here to discuss her progress with her obesity treatment plan along with follow-up of her obesity related diagnoses. Thi is on the Category 4 Plan and states she is following her eating plan approximately 50% of the time. [name] states she is not currently exercising.   Today's visit was #: 24 Starting weight: 207 lbs Starting date: 02/09/2020 Today's weight: 176 lbs Today's date: 09/12/2021 Total lbs lost to date: 31 lbs Total lbs lost since last in-office visit: 0 lbs  Interim History: Diana King is really struggling with hunger and indulgent eating. She has a cholecystectomy scheduled for 6 days from now. She is working with a therapist to start to figure out some body dysmorphic issues.  Subjective:   1. Polyphagia PDMP checked and Controlled Substance Contract signed. Chessie states a significant increase in food intake since being off Mounjaro. She has been combing craving and hunger eating.  2. Body dysmorphic disorder [name] is seeing a therapist and working thru Psychologist, clinical. [name] denies any suicidal or homicidal ideations.  Assessment/Plan:   1. Polyphagia Diana King will start Phentermine 4 mg daily for 2 weeks, then increase to 8 mg daily. She will also start Topiramate 25 mg for 2 weeks, then increase to 50 mg daily. - Phentermine HCl (LOMAIRA) 8 MG TABS; Take 8 mg by mouth every morning.  Dispense: 30 tablet; Refill: 0 - topiramate (TOPAMAX) 25 MG tablet; Take 2 tablets (50 mg total) by mouth daily.  Dispense: 45 tablet; Refill: 0  2. Body dysmorphic disorder Discussed with Diana King further strategies to work on self acceptance as well as exercises to decrease self deprecation.  3. Obesity with current BMI of 28.5 Diana King is currently in the action stage of change. As such, her goal is to continue with weight loss efforts. She has agreed to the Category 4 Plan.   Exercise goals: All adults should avoid inactivity. Some  physical activity is better than none, and adults who participate in any amount of physical activity gain some health benefits.  Behavioral modification strategies: increasing lean protein intake, meal planning and cooking strategies, and keeping healthy foods in the home.  Diana King has agreed to follow-up with our clinic in 3-4 weeks. She was informed of the importance of frequent follow-up visits to maximize her success with intensive lifestyle modifications for her multiple health conditions.   Objective:   Blood pressure 132/82, pulse 84, temperature 98.2 F (36.8 C), height 5\' 6"  (1.676 m), weight 176 lb (79.8 kg), last menstrual period 07/21/2021, SpO2 95 %. Body mass index is 28.41 kg/m.  General: Cooperative, alert, well developed, in no acute distress. HEENT: Conjunctivae and lids unremarkable. Cardiovascular: Regular rhythm.  Lungs: Normal work of breathing. Neurologic: No focal deficits.   Lab Results  Component Value Date   CREATININE 0.91 09/03/2021   BUN 15 09/03/2021   NA 137 09/03/2021   K 4.3 09/03/2021   CL 102 09/03/2021   CO2 25 09/03/2021   Lab Results  Component Value Date   ALT 10 09/03/2021   AST 14 09/03/2021   ALKPHOS 29 (L) 09/03/2021   BILITOT 0.3 09/03/2021   Lab Results  Component Value Date   HGBA1C 5.5 03/27/2021   HGBA1C 5.6 07/10/2020   HGBA1C 5.5 02/09/2020   Lab Results  Component Value Date   INSULIN 13.4 07/10/2020   INSULIN 13.5 02/09/2020   Lab Results  Component Value Date   TSH 2.67 03/27/2021   Lab Results  Component Value Date   CHOL 211 (H) 03/27/2021   HDL 63.10 03/27/2021   LDLCALC 134 (H) 03/27/2021   TRIG 68.0 03/27/2021   CHOLHDL 3 03/27/2021   Lab Results  Component Value Date   VD25OH 87.07 03/27/2021   VD25OH 52.2 07/10/2020   VD25OH 25.5 (L) 02/09/2020   Lab Results  Component Value Date   WBC 8.7 09/03/2021   HGB 13.7 09/03/2021   HCT 42.0 09/03/2021   MCV 87.7 09/03/2021   PLT 398.0 09/03/2021    No results found for: "IRON", "TIBC", "FERRITIN"  Attestation Statements:   Reviewed by clinician on day of visit: allergies, medications, problem list, medical history, surgical history, family history, social history, and previous encounter notes.  IPaulla Fore, CMA, am acting as transcriptionist for Dr. [name], MD.  I have reviewed the above documentation for accuracy and completeness, and I agree with the above. - [name], MD

## 2021-09-24 ENCOUNTER — Encounter: Payer: Self-pay | Admitting: Internal Medicine

## 2021-09-24 NOTE — Progress Notes (Signed)
      Subjective:    Patient ID: Diana King, female    DOB: [birth year], 31 y.o.   MRN: [id number]     HPI Diana King is here for follow up of her chronic medical problems, including anxiety, depression  The citalopram is helping.  She is talking to a therapist.      Medications and allergies reviewed with patient and updated if appropriate.  Current Outpatient Medications on File Prior to Visit  Medication Sig Dispense Refill   Adapalene 0.3 % gel Apply 1 application topically at bedtime. 45 g 5   buPROPion (WELLBUTRIN XL) 300 MG 24 hr tablet Take 1 tablet (300 mg total) by mouth daily. 90 tablet 3   busPIRone (BUSPAR) 5 MG tablet Take 4 tablets (20 mg total) by mouth 2 (two) times daily. 180 tablet 3   Cholecalciferol (VITAMIN D3) 125 MCG (5000 UT) CAPS Take 1 capsule (5,000 Units total) by mouth every other day. 30 capsule    citalopram (CELEXA) 20 MG tablet TAKE 1 TABLET BY MOUTH EVERY DAY 90 tablet 2   clindamycin (CLINDAGEL) 1 % gel Apply topically 2 (two) times daily.     drospirenone-ethinyl estradiol (YAZ,GIANVI,LORYNA) 3-0.02 MG tablet Take 1 tablet by mouth daily.     Phentermine HCl (LOMAIRA) 8 MG TABS Take 8 mg by mouth every morning. 30 tablet 0   spironolactone (ALDACTONE) 50 MG tablet Take 50 mg by mouth daily.     tiZANidine (ZANAFLEX) 4 MG tablet TAKE 1 TABLET BY MOUTH EVERYDAY AT BEDTIME 90 tablet 1   topiramate (TOPAMAX) 25 MG tablet Take 2 tablets (50 mg total) by mouth daily. 45 tablet 0   No current facility-administered medications on file prior to visit.     Review of Systems     Objective:  There were no vitals filed for this visit. BP Readings from Last 3 Encounters:  09/12/21 132/82  08/20/21 128/74  08/15/21 118/76   Wt Readings from Last 3 Encounters:  09/12/21 176 lb (79.8 kg)  08/20/21 172 lb (78 kg)  08/15/21 172 lb (78 kg)   There is no height or weight on file to calculate BMI.    Physical Exam     Lab Results  Component  Value Date   WBC 8.7 09/03/2021   HGB 13.7 09/03/2021   HCT 42.0 09/03/2021   PLT 398.0 09/03/2021   GLUCOSE 85 09/03/2021   CHOL 211 (H) 03/27/2021   TRIG 68.0 03/27/2021   HDL 63.10 03/27/2021   LDLCALC 134 (H) 03/27/2021   ALT 10 09/03/2021   AST 14 09/03/2021   NA 137 09/03/2021   K 4.3 09/03/2021   CL 102 09/03/2021   CREATININE 0.91 09/03/2021   BUN 15 09/03/2021   CO2 25 09/03/2021   TSH 2.67 03/27/2021   HGBA1C 5.5 03/27/2021     Assessment & Plan:    See Problem List for Assessment and Plan of chronic medical problems.   This encounter was created in error - please disregard.

## 2021-09-24 NOTE — Patient Instructions (Addendum)
       Medications changes include :      Your prescription(s) have been sent to your pharmacy.     Return in about 6 months (around 03/26/2022) for follow up.

## 2021-09-25 ENCOUNTER — Encounter: Payer: Self-pay | Admitting: Internal Medicine

## 2021-09-25 ENCOUNTER — Encounter: Payer: BC Managed Care – PPO | Admitting: Internal Medicine

## 2021-10-09 ENCOUNTER — Ambulatory Visit (INDEPENDENT_AMBULATORY_CARE_PROVIDER_SITE_OTHER): Payer: BC Managed Care – PPO | Admitting: Family Medicine

## 2021-10-09 ENCOUNTER — Encounter (INDEPENDENT_AMBULATORY_CARE_PROVIDER_SITE_OTHER): Payer: Self-pay | Admitting: Family Medicine

## 2021-10-09 VITALS — BP 127/81 | HR 83 | Temp 98.6°F | Ht 66.0 in | Wt 177.0 lb

## 2021-10-09 DIAGNOSIS — Z6828 Body mass index (BMI) 28.0-28.9, adult: Secondary | ICD-10-CM | POA: Diagnosis not present

## 2021-10-09 DIAGNOSIS — K5909 Other constipation: Secondary | ICD-10-CM

## 2021-10-09 DIAGNOSIS — E669 Obesity, unspecified: Secondary | ICD-10-CM | POA: Diagnosis not present

## 2021-10-09 DIAGNOSIS — R632 Polyphagia: Secondary | ICD-10-CM | POA: Diagnosis not present

## 2021-10-09 MED ORDER — LOMAIRA 8 MG PO TABS: 12.0000 mg | ORAL_TABLET | Freq: Every morning | ORAL | 0 refills | Status: DC

## 2021-10-09 MED ORDER — TOPIRAMATE 25 MG PO TABS: 75.0000 mg | ORAL_TABLET | Freq: Every day | ORAL | 0 refills | Status: DC

## 2021-10-11 NOTE — Progress Notes (Signed)
Chief Complaint:   OBESITY Diana King is here to discuss her progress with her obesity treatment plan along with follow-up of her obesity related diagnoses. Diana King is on the Category 4 Plan and states she is following her eating plan approximately 80% of the time. Diana King states she has not been exercising.  Today's visit was #: 25 Starting weight: 207 lbs Starting date: 02/09/2020 Today's weight: 177 lbs Today's date: 10/09/21 Total lbs lost to date: 30 Total lbs lost since last in-office visit: +1  Interim History: She had a cholecystectomy since last appointment.  She has been feeling a little bit more human after surgery.  She started [place] and topiramate 1.5 weeks after surgery (3 days ago, started 8/50 mg).  Subjective:   1. Other constipation Alternating between constipation and significant urgency.  Occasionally taking MiraLAX.  2. Polyphagia On Lomaira/topiramate 8/50 mg for 4 days. PDMP checked. Weight of 176 (5% loss is 9 pounds by end of December).  Assessment/Plan:   1. Other constipation Start MiraLAX 15 g by mouth daily over-the-counter.  2. Polyphagia Refill and increase dose of Lomaira to 12 mg daily. - Phentermine HCl (LOMAIRA) 8 MG TABS; Take 12 mg by mouth every morning.  Dispense: 45 tablet; Refill: 0 - topiramate (TOPAMAX) 25 MG tablet; Take 3 tablets (75 mg total) by mouth daily.  Dispense: 90 tablet; Refill: 0  3. Obesity with current BMI of 28.6 Diana King is currently in the action stage of change. As such, her goal is to continue with weight loss efforts. She has agreed to the Category 4 Plan.   Exercise goals: All adults should avoid inactivity. Some physical activity is better than none, and adults who participate in any amount of physical activity gain some health benefits.  Behavioral modification strategies: increasing lean protein intake, meal planning and cooking strategies, keeping healthy foods in the home, and planning for success.  Diana King  has agreed to follow-up with our clinic in 3-4 weeks. She was informed of the importance of frequent follow-up visits to maximize her success with intensive lifestyle modifications for her multiple health conditions.   Objective:   Blood pressure 127/81, pulse 83, temperature 98.6 F (37 C), height 5\' 6"  (1.676 m), weight 177 lb (80.3 kg), SpO2 99 %. Body mass index is 28.57 kg/m.  General: Cooperative, alert, well developed, in no acute distress. HEENT: Conjunctivae and lids unremarkable. Cardiovascular: Regular rhythm.  Lungs: Normal work of breathing. Neurologic: No focal deficits.   Lab Results  Component Value Date   CREATININE 0.91 09/03/2021   BUN 15 09/03/2021   NA 137 09/03/2021   K 4.3 09/03/2021   CL 102 09/03/2021   CO2 25 09/03/2021   Lab Results  Component Value Date   ALT 10 09/03/2021   AST 14 09/03/2021   ALKPHOS 29 (L) 09/03/2021   BILITOT 0.3 09/03/2021   Lab Results  Component Value Date   HGBA1C 5.5 03/27/2021   HGBA1C 5.6 07/10/2020   HGBA1C 5.5 02/09/2020   Lab Results  Component Value Date   INSULIN 13.4 07/10/2020   INSULIN 13.5 02/09/2020   Lab Results  Component Value Date   TSH 2.67 03/27/2021   Lab Results  Component Value Date   CHOL 211 (H) 03/27/2021   HDL 63.10 03/27/2021   LDLCALC 134 (H) 03/27/2021   TRIG 68.0 03/27/2021   CHOLHDL 3 03/27/2021   Lab Results  Component Value Date   VD25OH 87.07 03/27/2021   VD25OH 52.2 07/10/2020  VD25OH 25.5 (L) 02/09/2020   Lab Results  Component Value Date   WBC 8.7 09/03/2021   HGB 13.7 09/03/2021   HCT 42.0 09/03/2021   MCV 87.7 09/03/2021   PLT 398.0 09/03/2021   No results found for: "IRON", "TIBC", "FERRITIN"   Attestation Statements:   Reviewed by clinician on day of visit: allergies, medications, problem list, medical history, surgical history, family history, social history, and previous encounter notes.  I, Dawn Whitmire, FNP-C, am acting as Energy manager for  [name], MD.  I have reviewed the above documentation for accuracy and completeness, and I agree with the above. - [name], MD

## 2021-10-16 DIAGNOSIS — Z79899 Other long term (current) drug therapy: Secondary | ICD-10-CM | POA: Diagnosis not present

## 2021-10-16 DIAGNOSIS — L700 Acne vulgaris: Secondary | ICD-10-CM | POA: Diagnosis not present

## 2021-10-29 ENCOUNTER — Ambulatory Visit: Payer: BC Managed Care – PPO | Admitting: Family Medicine

## 2021-10-29 VITALS — BP 108/74 | HR 89 | Ht 66.0 in | Wt 175.0 lb

## 2021-10-29 DIAGNOSIS — M545 Low back pain, unspecified: Secondary | ICD-10-CM | POA: Diagnosis not present

## 2021-10-29 DIAGNOSIS — M9901 Segmental and somatic dysfunction of cervical region: Secondary | ICD-10-CM | POA: Diagnosis not present

## 2021-10-29 DIAGNOSIS — M9904 Segmental and somatic dysfunction of sacral region: Secondary | ICD-10-CM

## 2021-10-29 DIAGNOSIS — M9908 Segmental and somatic dysfunction of rib cage: Secondary | ICD-10-CM

## 2021-10-29 DIAGNOSIS — M9902 Segmental and somatic dysfunction of thoracic region: Secondary | ICD-10-CM | POA: Diagnosis not present

## 2021-10-29 DIAGNOSIS — M9903 Segmental and somatic dysfunction of lumbar region: Secondary | ICD-10-CM

## 2021-10-29 NOTE — Assessment & Plan Note (Signed)
Low back does have loss of lordosis patient is overall though needs to start increasing her activity again.  Responded well to osteopathic manipulation.  Follow-up again in 6 to 8 weeks.

## 2021-10-29 NOTE — Progress Notes (Signed)
  [place] Phone: [phone] Subjective:    I'm seeing this patient by the request  of:  Binnie Rail, MD  CC: back and neck pain   GBT:[id number]  Diana King is a 31 y.o. female coming in with complaint of back and neck pain. Patient states that she needs maintenance.   Medications patient has been prescribed:   Taking:         Reviewed prior external information including notes and imaging from previsou exam, outside providers and external EMR if available.   As well as notes that were available from care everywhere and other healthcare systems.  Past medical history, social, surgical and family history all reviewed in electronic medical record.  No pertanent information unless stated regarding to the chief complaint.   Past Medical History:  Diagnosis Date   Acne    Anxiety    Back pain    Depression    GERD (gastroesophageal reflux disease)    IBS (irritable bowel syndrome)    Overweight    Pain in Achilles tendon    Palpitations     Allergies  Allergen Reactions   Latex Swelling   Gabapentin      Review of Systems:  No headache, visual changes, nausea, vomiting, diarrhea, constipation, dizziness, abdominal pain, skin rash, fevers, chills, night sweats, weight loss, swollen lymph nodes, body aches, joint swelling, chest pain, shortness of breath, mood changes. POSITIVE muscle aches  Objective  Blood pressure 108/74, pulse 89, height 5\' 6"  (1.676 m), weight 175 lb (79.4 kg), SpO2 96 %.   General: No apparent distress alert and oriented x3 mood and affect normal, dressed appropriately.  HEENT: Pupils equal, extraocular movements intact  Respiratory: Patient's speak in full sentences and does not appear short of breath  Cardiovascular: No lower extremity edema, non tender, no erythema  Gait normal  MSK:  Back   Osteopathic findings  C2 flexed rotated and side bent right T5  extended rotated and side bent right inhaled rib T9 extended rotated and side bent left L2 flexed rotated and side bent right      Assessment and Plan:  Low back pain Low back does have loss of lordosis patient is overall though needs to start increasing her activity again.  Responded well to osteopathic manipulation.  Follow-up again in 6 to 8 weeks.    Nonallopathic problems  Decision today to treat with OMT was based on Physical Exam  After verbal consent  areas cervical, thoracic and rib   Patient tolerated the procedure well with improvement in symptoms  Patient given exercises, stretches and lifestyle modifications  See medications in patient instructions if given  Patient will follow up in 4-8 weeks     The above documentation has been reviewed and is accurate and complete Lyndal Pulley, DO         Note: This dictation was prepared with Dragon dictation along with smaller phrase technology. Any transcriptional errors that result from this process are unintentional.

## 2021-11-01 ENCOUNTER — Other Ambulatory Visit (INDEPENDENT_AMBULATORY_CARE_PROVIDER_SITE_OTHER): Payer: Self-pay | Admitting: Family Medicine

## 2021-11-01 DIAGNOSIS — R632 Polyphagia: Secondary | ICD-10-CM

## 2021-11-06 ENCOUNTER — Ambulatory Visit: Payer: BC Managed Care – PPO | Admitting: Family Medicine

## 2021-11-06 ENCOUNTER — Ambulatory Visit (INDEPENDENT_AMBULATORY_CARE_PROVIDER_SITE_OTHER): Payer: BC Managed Care – PPO | Admitting: Family Medicine

## 2021-12-04 ENCOUNTER — Ambulatory Visit (INDEPENDENT_AMBULATORY_CARE_PROVIDER_SITE_OTHER): Payer: BC Managed Care – PPO | Admitting: Family Medicine

## 2021-12-04 ENCOUNTER — Encounter (INDEPENDENT_AMBULATORY_CARE_PROVIDER_SITE_OTHER): Payer: Self-pay | Admitting: Family Medicine

## 2021-12-04 VITALS — BP 129/85 | HR 74 | Temp 98.3°F | Ht 66.0 in | Wt 177.0 lb

## 2021-12-04 DIAGNOSIS — R632 Polyphagia: Secondary | ICD-10-CM

## 2021-12-04 DIAGNOSIS — E669 Obesity, unspecified: Secondary | ICD-10-CM

## 2021-12-04 DIAGNOSIS — E7849 Other hyperlipidemia: Secondary | ICD-10-CM | POA: Diagnosis not present

## 2021-12-04 DIAGNOSIS — Z6828 Body mass index (BMI) 28.0-28.9, adult: Secondary | ICD-10-CM | POA: Diagnosis not present

## 2021-12-19 NOTE — Progress Notes (Signed)
Chief Complaint:   OBESITY Diana King is here to discuss her progress with her obesity treatment plan along with follow-up of her obesity related diagnoses. [name] is on the Category 4 Plan and states she is following her eating plan approximately 70% of the time. [name] states she is working out 45 minutes 3 times per week.  Today's visit was #: 26 Starting weight: 207 lbs Starting date: 02/09/2020 Today's weight: 177 lbs Today's date: 12/04/2021 Total lbs lost to date: 30 lbs Total lbs lost since last in-office visit: 0  Interim History: Diana King has started working out more consistently. She has been fluctuating between following plan for a few days then feeling poorly for a couple of days then being ravenous. She had to stop Topiramate due to mental fogginess. She is driving to [place] for Thanksgiving.  Subjective:   1. Polyphagia Diana King was on Lomaira/Topiramate combo--stopped due to side effects of mental cloudiness and decrease in concentration.  2. Other hyperlipidemia Diana King's LDL at 134, HDL at 63 and Trigly ay 68. She is not on medications.  Assessment/Plan:   1. Polyphagia Stop Lomaira and stop Topiramate.  2. Other hyperlipidemia Will obtain labs at next appointment.  3. Obesity with current BMI of 28.7 Diana King is currently in the action stage of change. As such, her goal is to continue with weight loss efforts. She has agreed to the Category 4 Plan.   Exercise goals: As is.  Behavioral modification strategies: increasing lean protein intake, meal planning and cooking strategies, keeping healthy foods in the home, and planning for success.  Diana King has agreed to follow-up with our clinic in 4 weeks. She was informed of the importance of frequent follow-up visits to maximize her success with intensive lifestyle modifications for her multiple health conditions.   Objective:   Blood pressure 129/85, pulse 74, temperature 98.3 F (36.8 C), height 5\' 6"  (1.676 m),  weight 177 lb (80.3 kg), SpO2 98 %. Body mass index is 28.57 kg/m.  General: Cooperative, alert, well developed, in no acute distress. HEENT: Conjunctivae and lids unremarkable. Cardiovascular: Regular rhythm.  Lungs: Normal work of breathing. Neurologic: No focal deficits.   Lab Results  Component Value Date   CREATININE 0.91 09/03/2021   BUN 15 09/03/2021   NA 137 09/03/2021   K 4.3 09/03/2021   CL 102 09/03/2021   CO2 25 09/03/2021   Lab Results  Component Value Date   ALT 10 09/03/2021   AST 14 09/03/2021   ALKPHOS 29 (L) 09/03/2021   BILITOT 0.3 09/03/2021   Lab Results  Component Value Date   HGBA1C 5.5 03/27/2021   HGBA1C 5.6 07/10/2020   HGBA1C 5.5 02/09/2020   Lab Results  Component Value Date   INSULIN 13.4 07/10/2020   INSULIN 13.5 02/09/2020   Lab Results  Component Value Date   TSH 2.67 03/27/2021   Lab Results  Component Value Date   CHOL 211 (H) 03/27/2021   HDL 63.10 03/27/2021   LDLCALC 134 (H) 03/27/2021   TRIG 68.0 03/27/2021   CHOLHDL 3 03/27/2021   Lab Results  Component Value Date   VD25OH 87.07 03/27/2021   VD25OH 52.2 07/10/2020   VD25OH 25.5 (L) 02/09/2020   Lab Results  Component Value Date   WBC 8.7 09/03/2021   HGB 13.7 09/03/2021   HCT 42.0 09/03/2021   MCV 87.7 09/03/2021   PLT 398.0 09/03/2021   No results found for: "IRON", "TIBC", "FERRITIN"  Attestation Statements:   Reviewed by clinician on  day of visit: allergies, medications, problem list, medical history, surgical history, family history, social history, and previous encounter notes.  I, Fortino Sic, RMA am acting as transcriptionist for [name], MD. I have reviewed the above documentation for accuracy and completeness, and I agree with the above. - [name], MD

## 2021-12-25 NOTE — Progress Notes (Unsigned)
[name] [address] [place] [address] Phone: [phone] Subjective:    I'm seeing this patient by the request  of:  Pincus Sanes, MD  CC: Low back pain  XIP:[id number]  [name] is a 31 y.o. female coming in with complaint of back and neck pain Patient states Back pain along the spine, mainly in lumbar. Cannot find a position to sit or lay that doesn't cause discomfort. Once moving around for the day the pain does seem to calm down. When side laying the pain in localizes just to the left or the right of the spine depending on the side laying on. Having tightness mid spine. Taking tizanidine on the night the pain is really bad and causing sleeplessness.   Medications patient has been prescribed: Zanaflex  Taking:         Reviewed prior external information including notes and imaging from previsou exam, outside providers and external EMR if available.   As well as notes that were available from care everywhere and other healthcare systems.  Past medical history, social, surgical and family history all reviewed in electronic medical record.  No pertanent information unless stated regarding to the chief complaint.   Past Medical History:  Diagnosis Date   Acne    Anxiety    Back pain    Depression    GERD (gastroesophageal reflux disease)    IBS (irritable bowel syndrome)    Overweight    Pain in Achilles tendon    Palpitations     Allergies  Allergen Reactions   Latex Swelling   Topamax [Topiramate] Other (See Comments)   Gabapentin      Review of Systems:  No headache, visual changes, nausea, vomiting, diarrhea, constipation, dizziness, abdominal pain, skin rash, fevers, chills, night sweats, weight loss, swollen lymph nodes, body aches, joint swelling, chest pain, shortness of breath, mood changes. POSITIVE muscle aches  Objective  Pulse 76, height 5\' 6"  (1.676 m), weight 180 lb (81.6 kg), SpO2 98 %.    General: No apparent distress alert and oriented x3 mood and affect normal, dressed appropriately.  HEENT: Pupils equal, extraocular movements intact  Respiratory: Patient's speak in full sentences and does not appear short of breath  Cardiovascular: No lower extremity edema, non tender, no erythema  Gait MSK:  Back low back has some mild loss of lordosis.  Some tightness noted around the sacroiliac joints bilaterally.  Seems to be bilateral extension of the SI joint.  Tightness with FABER test.  Osteopathic findings T9 extended rotated and side bent left L2 flexed rotated and side bent right L5 flexed rotated and side bent left Sacrum right on right       Assessment and Plan:  Poor posture Tightness from trauma.  Responding well to manipulation.  No other significant changes in management.    Nonallopathic problems  Decision today to treat with OMT was based on Physical Exam  After verbal consent patient was treated with HVLA, ME, FPR techniques in  thoracic, lumbar, and sacral  areas  Patient tolerated the procedure well with improvement in symptoms  Patient given exercises, stretches and lifestyle modifications  See medications in patient instructions if given  Patient will follow up in 4-8 weeks    The above documentation has been reviewed and is accurate and complete , DO          Note: This dictation was prepared with Dragon dictation along with smaller phrase technology.  Any transcriptional errors that result from this process are unintentional.

## 2021-12-27 ENCOUNTER — Ambulatory Visit: Payer: BC Managed Care – PPO | Admitting: Family Medicine

## 2021-12-27 VITALS — HR 76 | Ht 66.0 in | Wt 180.0 lb

## 2021-12-27 DIAGNOSIS — M9903 Segmental and somatic dysfunction of lumbar region: Secondary | ICD-10-CM | POA: Diagnosis not present

## 2021-12-27 DIAGNOSIS — R293 Abnormal posture: Secondary | ICD-10-CM

## 2021-12-27 DIAGNOSIS — M9902 Segmental and somatic dysfunction of thoracic region: Secondary | ICD-10-CM | POA: Diagnosis not present

## 2021-12-27 DIAGNOSIS — M9904 Segmental and somatic dysfunction of sacral region: Secondary | ICD-10-CM | POA: Diagnosis not present

## 2021-12-27 NOTE — Assessment & Plan Note (Signed)
Tightness from trauma.  Responding well to manipulation.  No other significant changes in management.

## 2022-01-03 ENCOUNTER — Ambulatory Visit (INDEPENDENT_AMBULATORY_CARE_PROVIDER_SITE_OTHER): Payer: BC Managed Care – PPO | Admitting: Family Medicine

## 2022-01-08 ENCOUNTER — Other Ambulatory Visit: Payer: Self-pay

## 2022-01-08 MED ORDER — TIZANIDINE HCL 4 MG PO TABS
ORAL_TABLET | ORAL | 1 refills | Status: DC
Start: 2022-01-08 — End: 2022-12-25

## 2022-01-17 ENCOUNTER — Encounter (INDEPENDENT_AMBULATORY_CARE_PROVIDER_SITE_OTHER): Payer: Self-pay | Admitting: Family Medicine

## 2022-01-18 ENCOUNTER — Ambulatory Visit (INDEPENDENT_AMBULATORY_CARE_PROVIDER_SITE_OTHER): Payer: BC Managed Care – PPO | Admitting: Family Medicine

## 2022-01-18 ENCOUNTER — Ambulatory Visit: Payer: Self-pay

## 2022-01-18 VITALS — Ht 66.0 in | Wt 188.0 lb

## 2022-01-18 DIAGNOSIS — M7662 Achilles tendinitis, left leg: Secondary | ICD-10-CM | POA: Diagnosis not present

## 2022-01-18 DIAGNOSIS — M79671 Pain in right foot: Secondary | ICD-10-CM | POA: Diagnosis not present

## 2022-01-18 DIAGNOSIS — S93311A Subluxation of tarsal joint of right foot, initial encounter: Secondary | ICD-10-CM

## 2022-01-18 DIAGNOSIS — E559 Vitamin D deficiency, unspecified: Secondary | ICD-10-CM

## 2022-01-18 MED ORDER — MELOXICAM 15 MG PO TABS: 15.0000 mg | ORAL_TABLET | Freq: Every day | ORAL | 0 refills | Status: DC

## 2022-01-18 MED ORDER — VITAMIN D (ERGOCALCIFEROL) 1.25 MG (50000 UNIT) PO CAPS: 50000.0000 [IU] | ORAL_CAPSULE | ORAL | 0 refills | Status: DC

## 2022-01-18 NOTE — Patient Instructions (Signed)
Meloxicam daily for 10 days 15mg  Vit D Once weekly  Heel lift in the shoes New Balance shoes You know where to find me

## 2022-01-18 NOTE — Assessment & Plan Note (Signed)
Refilled vitamin D patient is having calcific changes noted of the Achilles today.  Discussed icing regimen and home exercises.

## 2022-01-18 NOTE — Progress Notes (Signed)
[name] [address] [place] [address] Phone: [phone] Subjective:   Diana King, am serving as a scribe for Dr. Antoine Primas.  I'm seeing this patient by the request  of:  Pincus Sanes, MD  CC: Foot pain  DXA:[id number]  Diana King is a 31 y.o. female coming in with complaint of right foot pain.  Patient has had drop cuboid previously.  Patient is also having difficulty with her Achilles tendon. Right foot arch pain going on for 3 days. Hurts to drive and to walk, especially barefoot. Left foot Achilles pain been on and off for about a month. The last 5 days has been constant when walking. Bilateral shin pain been going on for 3 days, patient assuming because of the change in the way she is walking with the foot pain. Patient really wanting the Achilles pain looked at to make sure she didn't partially tear it again. The right foot pain she wants to make sure that the cuboid doesn't need to be put back in place like before.  patient was on vacation in [place] where there was a lot of walking like 5+ miles a day. Patient wore New Balance tennis shoes, but forgot the house sandals to wear inside.      Past Medical History:  Diagnosis Date   Acne    Anxiety    Back pain    Depression    GERD (gastroesophageal reflux disease)    IBS (irritable bowel syndrome)    Overweight    Pain in Achilles tendon    Palpitations    Past Surgical History:  Procedure Laterality Date   NO PAST SURGERIES     Social History   Socioeconomic History   Marital status: Married    Spouse name: Dustin   Number of children: 1   Years of education: Not on file   Highest education level: Not on file  Occupational History   Occupation: CMA  Tobacco Use   Smoking status: Never   Smokeless tobacco: Never  Substance and Sexual Activity   Alcohol use: Yes    Comment: social   Drug use: Not on file   Sexual activity: Not on file  Other  Topics Concern   Not on file  Social History Narrative   Not on file   Social Determinants of Health   Financial Resource Strain: Not on file  Food Insecurity: Not on file  Transportation Needs: Not on file  Physical Activity: Not on file  Stress: Not on file  Social Connections: Not on file   Allergies  Allergen Reactions   Latex Swelling   Topamax [Topiramate] Other (See Comments)   Gabapentin    Family History  Problem Relation Age of Onset   Fibromyalgia Mother    Interstitial cystitis Mother    Migraines Mother    Depression Mother    Anxiety disorder Mother    Alcohol abuse Maternal Aunt    Mental illness Maternal Aunt    Alcohol abuse Maternal Uncle    Thyroid disease Maternal Grandmother    Hyperlipidemia Father    Alcohol abuse Father     Current Outpatient Medications (Endocrine & Metabolic):    drospirenone-ethinyl estradiol (YAZ,GIANVI,LORYNA) 3-0.02 MG tablet, Take 1 tablet by mouth daily.  Current Outpatient Medications (Cardiovascular):    spironolactone (ALDACTONE) 50 MG tablet, Take 50 mg by mouth daily.   Current Outpatient Medications (Analgesics):    meloxicam (MOBIC) 15 MG tablet,  Take 1 tablet (15 mg total) by mouth daily.   Current Outpatient Medications (Other):    Vitamin D, Ergocalciferol, (DRISDOL) 1.25 MG (50000 UNIT) CAPS capsule, Take 1 capsule (50,000 Units total) by mouth every 7 (seven) days.   Adapalene 0.3 % gel, Apply 1 application topically at bedtime.   buPROPion (WELLBUTRIN XL) 300 MG 24 hr tablet, Take 1 tablet (300 mg total) by mouth daily.   busPIRone (BUSPAR) 5 MG tablet, Take 4 tablets (20 mg total) by mouth 2 (two) times daily.   clindamycin (CLINDAGEL) 1 % gel, Apply topically 2 (two) times daily.   tiZANidine (ZANAFLEX) 4 MG tablet, TAKE 1 TABLET BY MOUTH EVERYDAY AT BEDTIME   Reviewed prior external information including notes and imaging from  primary care provider As well as notes that were available from care  everywhere and other healthcare systems.  Past medical history, social, surgical and family history all reviewed in electronic medical record.  No pertanent information unless stated regarding to the chief complaint.   Review of Systems:  No headache, visual changes, nausea, vomiting, diarrhea, constipation, dizziness, abdominal pain, skin rash, fevers, chills, night sweats, weight loss, swollen lymph nodes, body aches, joint swelling, chest pain, shortness of breath, mood changes. POSITIVE muscle aches  Objective  Height 5\' 6"  (1.676 m), weight 188 lb (85.3 kg).   General: No apparent distress alert and oriented x3 mood and affect normal, dressed appropriately.  HEENT: Pupils equal, extraocular movements intact  Respiratory: Patient's speak in full sentences and does not appear short of breath  Cardiovascular: No lower extremity edema, non tender, no erythema   Foot exam shows tightness and tenderness of the Achilles on the left side.  Patient's right foot has more pain over the longitudinal arch itself but no significant masses appreciated.  No drop cuboid appreciated.    Limited muscular skeletal ultrasound was performed and interpreted by Hulan Saas, M  Limited ultrasound of patient's left Achilles shows some calcific likely bone spurring occurring more than into the tendon itself.  No significant hypoechoic changes or any interstitial tearing noted.    Impression and Recommendations:    The above documentation has been reviewed and is accurate and complete Lyndal Pulley, DO

## 2022-01-19 NOTE — Assessment & Plan Note (Signed)
Patient has had Achilles tendinitis multiple times over the course of time.  Also has had some interstitial tearing.  We discussed proper shoes, we discussed that there is likely a heel spur and the possibility of shockwave therapy.  Heel lift given and we will see how patient responds.  Follow-up with me again in 6 weeks

## 2022-01-19 NOTE — Assessment & Plan Note (Signed)
No sign of any drop cuboid today.  Discussed icing regimen and home exercises, discussed which activities to do and which ones to avoid.  Increase activity slowly.  Follow-up again in 6 to 8 weeks

## 2022-01-31 ENCOUNTER — Encounter (INDEPENDENT_AMBULATORY_CARE_PROVIDER_SITE_OTHER): Payer: Self-pay | Admitting: Family Medicine

## 2022-01-31 ENCOUNTER — Encounter (INDEPENDENT_AMBULATORY_CARE_PROVIDER_SITE_OTHER): Payer: Self-pay

## 2022-01-31 ENCOUNTER — Ambulatory Visit (INDEPENDENT_AMBULATORY_CARE_PROVIDER_SITE_OTHER): Payer: BC Managed Care – PPO | Admitting: Family Medicine

## 2022-01-31 VITALS — BP 113/74 | HR 71 | Temp 97.7°F | Ht 66.0 in | Wt 185.0 lb

## 2022-01-31 DIAGNOSIS — E7849 Other hyperlipidemia: Secondary | ICD-10-CM

## 2022-01-31 DIAGNOSIS — E559 Vitamin D deficiency, unspecified: Secondary | ICD-10-CM

## 2022-01-31 DIAGNOSIS — Z6830 Body mass index (BMI) 30.0-30.9, adult: Secondary | ICD-10-CM

## 2022-01-31 DIAGNOSIS — E669 Obesity, unspecified: Secondary | ICD-10-CM

## 2022-01-31 DIAGNOSIS — E88819 Insulin resistance, unspecified: Secondary | ICD-10-CM | POA: Diagnosis not present

## 2022-01-31 MED ORDER — WEGOVY 0.25 MG/0.5ML ~~LOC~~ SOAJ: 0.2500 mg | SUBCUTANEOUS | 0 refills | Status: DC

## 2022-02-01 LAB — LIPID PANEL WITH LDL/HDL RATIO
Cholesterol, Total: 230 mg/dL — ABNORMAL HIGH (ref 100–199)
HDL: 66 mg/dL (ref >39)
LDL Chol Calc (NIH): 149 mg/dL — ABNORMAL HIGH (ref 0–99)
LDL/HDL Ratio: 2.3 ratio (ref 0.0–3.2)
Triglycerides: 84 mg/dL (ref 0–149)
VLDL Cholesterol Cal: 15 mg/dL (ref 5–40)

## 2022-02-01 LAB — HEMOGLOBIN A1C
Est. average glucose Bld gHb Est-mCnc: 117 mg/dL
Hgb A1c MFr Bld: 5.7 % — ABNORMAL HIGH (ref 4.8–5.6)

## 2022-02-01 LAB — VITAMIN D 25 HYDROXY (VIT D DEFICIENCY, FRACTURES): Vit D, 25-Hydroxy: 37.2 ng/mL (ref 30.0–100.0)

## 2022-02-01 LAB — INSULIN, RANDOM: INSULIN: 8.4 u[IU]/mL (ref 2.6–24.9)

## 2022-02-04 ENCOUNTER — Other Ambulatory Visit (INDEPENDENT_AMBULATORY_CARE_PROVIDER_SITE_OTHER): Payer: Self-pay | Admitting: Family Medicine

## 2022-02-04 DIAGNOSIS — E669 Obesity, unspecified: Secondary | ICD-10-CM

## 2022-02-07 ENCOUNTER — Encounter (INDEPENDENT_AMBULATORY_CARE_PROVIDER_SITE_OTHER): Payer: Self-pay

## 2022-02-11 NOTE — Progress Notes (Signed)
Chief Complaint:   OBESITY Diana King is here to discuss her progress with her obesity treatment plan along with follow-up of her obesity related diagnoses. Diana King is on the Category 4 Plan and states she is following her eating plan approximately 60% of the time. Diana King states she is doing BURN 45 minutes 3 times per week.  Today's visit was #: 27 Starting weight: 207 lbs Starting date: 02/09/2020 Today's weight: 185 lbs Today's date: 01/31/2022 Total lbs lost to date: 22 lbs Total lbs lost since last in-office visit: 0  Interim History: Diana King drove from [place] for Thanksgiving and was in [place] for Christmas.  Work has been good.  Has been trying to limit candy and baked goods.  Tired of feeling hungry.  Subjective:   1. Other hyperlipidemia Last LD elevated.  2. Vitamin D deficiency Diana King is currently taking prescription Vit D 50,000 IU once a week. Denies any nausea, vomiting or muscle weakness.  She notes fatigue.  3. Insulin resistance Insulin initially elevated.  Assessment/Plan:   1. Other hyperlipidemia We will obtain labs today.  - Lipid Panel With LDL/HDL Ratio  2. Vitamin D deficiency We will obtain labs today.  - VITAMIN D 25 Hydroxy (Vit-D Deficiency, Fractures)  3. Insulin resistance We will obtain labs today.  - Hemoglobin A1c - Insulin, random  4. Obesity with current BMI of 30.0 Start Wegovy 0.25 mg SubQ once weekly for 1 month with 0 refills.  Start Semaglutide-Weight Management (WEGOVY) 0.25 MG/0.5ML SOAJ; Inject 0.25 mg into the skin once a week.  Dispense: 2 mL; Refill: 0  Diana King is currently in the action stage of change. As such, her goal is to continue with weight loss efforts. She has agreed to the Category 4 Plan.   Exercise goals: All adults should avoid inactivity. Some physical activity is better than none, and adults who participate in any amount of physical activity gain some health benefits.  Behavioral modification  strategies: increasing lean protein intake, meal planning and cooking strategies, and keeping healthy foods in the home.  Diana King has agreed to follow-up with our clinic in 3 weeks. She was informed of the importance of frequent follow-up visits to maximize her success with intensive lifestyle modifications for her multiple health conditions.   Diana King was informed we would discuss her lab results at her next visit unless there is a critical issue that needs to be addressed sooner. Diana King agreed to keep her next visit at the agreed upon time to discuss these results.  Objective:   Blood pressure 113/74, pulse 71, temperature 97.7 F (36.5 C), height 5\' 6"  (1.676 m), weight 185 lb (83.9 kg), SpO2 97 %. Body mass index is 29.86 kg/m.  General: Cooperative, alert, well developed, in no acute distress. HEENT: Conjunctivae and lids unremarkable. Cardiovascular: Regular rhythm.  Lungs: Normal work of breathing. Neurologic: No focal deficits.   Lab Results  Component Value Date   CREATININE 0.91 09/03/2021   BUN 15 09/03/2021   NA 137 09/03/2021   K 4.3 09/03/2021   CL 102 09/03/2021   CO2 25 09/03/2021   Lab Results  Component Value Date   ALT 10 09/03/2021   AST 14 09/03/2021   ALKPHOS 29 (L) 09/03/2021   BILITOT 0.3 09/03/2021   Lab Results  Component Value Date   HGBA1C 5.7 (H) 01/31/2022   HGBA1C 5.5 03/27/2021   HGBA1C 5.6 07/10/2020   HGBA1C 5.5 02/09/2020   Lab Results  Component Value Date   INSULIN  8.4 01/31/2022   INSULIN 13.4 07/10/2020   INSULIN 13.5 02/09/2020   Lab Results  Component Value Date   TSH 2.67 03/27/2021   Lab Results  Component Value Date   CHOL 230 (H) 01/31/2022   HDL 66 01/31/2022   LDLCALC 149 (H) 01/31/2022   TRIG 84 01/31/2022   CHOLHDL 3 03/27/2021   Lab Results  Component Value Date   VD25OH 37.2 01/31/2022   VD25OH 87.07 03/27/2021   VD25OH 52.2 07/10/2020   Lab Results  Component Value Date   WBC 8.7 09/03/2021   HGB  13.7 09/03/2021   HCT 42.0 09/03/2021   MCV 87.7 09/03/2021   PLT 398.0 09/03/2021   No results found for: "IRON", "TIBC", "FERRITIN"  Attestation Statements:   Reviewed by clinician on day of visit: allergies, medications, problem list, medical history, surgical history, family history, social history, and previous encounter notes.  I, Diana King, RMA am acting as transcriptionist for Diana Common, MD. I have reviewed the above documentation for accuracy and completeness, and I agree with the above. - Diana Common, MD

## 2022-02-21 ENCOUNTER — Ambulatory Visit (INDEPENDENT_AMBULATORY_CARE_PROVIDER_SITE_OTHER): Payer: BC Managed Care – PPO | Admitting: Family Medicine

## 2022-02-26 ENCOUNTER — Ambulatory Visit (INDEPENDENT_AMBULATORY_CARE_PROVIDER_SITE_OTHER): Payer: BC Managed Care – PPO | Admitting: Family Medicine

## 2022-02-26 ENCOUNTER — Encounter (INDEPENDENT_AMBULATORY_CARE_PROVIDER_SITE_OTHER): Payer: Self-pay | Admitting: Family Medicine

## 2022-02-26 VITALS — BP 139/75 | HR 77 | Temp 98.5°F | Ht 66.0 in | Wt 189.0 lb

## 2022-02-26 DIAGNOSIS — Z6830 Body mass index (BMI) 30.0-30.9, adult: Secondary | ICD-10-CM | POA: Diagnosis not present

## 2022-02-26 DIAGNOSIS — E7849 Other hyperlipidemia: Secondary | ICD-10-CM | POA: Diagnosis not present

## 2022-02-26 DIAGNOSIS — E669 Obesity, unspecified: Secondary | ICD-10-CM | POA: Diagnosis not present

## 2022-02-26 DIAGNOSIS — R7303 Prediabetes: Secondary | ICD-10-CM

## 2022-02-26 MED ORDER — WEGOVY 0.25 MG/0.5ML ~~LOC~~ SOAJ: 0.2500 mg | SUBCUTANEOUS | 0 refills | Status: DC

## 2022-02-26 NOTE — Progress Notes (Deleted)
Patient hasn't started [facility] yet.  After last appointment she discussed with her husband the likelihood of needing to stop an injection again at some point.  She voices concern about savings card running out.  Food wise she has tried to incorporate more protein at night and did notice a more control in terms of snacking.  Was doing a one pot meal and portioning out her meals but felt she wasn't that accurate with her intake and logging.  She has doing some emotional eating.  Has restarted Burn boot camp and is going 3-4x/ week. Has started a new hobby of making a model car.

## 2022-03-06 ENCOUNTER — Encounter (INDEPENDENT_AMBULATORY_CARE_PROVIDER_SITE_OTHER): Payer: Self-pay | Admitting: Family Medicine

## 2022-03-12 NOTE — Progress Notes (Signed)
Chief Complaint:   OBESITY Diana King is here to discuss her progress with her obesity treatment plan along with follow-up of her obesity related diagnoses. [name] is on the Category 4 Plan and states she is following her eating plan approximately 75-80% of the time. [name] states she is going to [employer] 45 minutes 3-4 times per week.  Today's visit was #: 28 Starting weight: 207 lbs Starting date: 02/09/2020 Today's weight: 189 lbs Today's date: 02/26/2022 Total lbs lost to date: 18 lbs Total lbs lost since last in-office visit: 0  Interim History: [name] has not started [facility] yet.  After last appointment she discussed with her husband the likelihood of needing to stop an injection again at some point.  She voices concern about savings card running out.  Food wise she has tried to incorporate more protein at night and did notice a more control in terms of snacking.  Was doing a one pot meal and portioning out her meals but felt she wasn't that accurate with her intake and logging.  She has doing some emotional eating.  Has restarted Burn boot camp and is going 3-4x/ week. Has started a new hobby of making a model car.  Subjective:   1. Other hyperlipidemia Labs discussed during visit today.  Worsening, LDL more elevated again at 149, HDL at 66.  Not on medications.  2. Prediabetes Labs discussed during visit today.  Worsening, new, A1c at 5.7, insulin at 8.  Not on medications.  GLP-1 medication written.  Assessment/Plan:   1. Other hyperlipidemia Will repeat labs in 3 months.  2. Prediabetes Will repeat labs in 3 months.  3. Obesity with current BMI of 30.6 Will refill Wegovy 0.25 mg SubQ once a week for 1 month with 0- refills.  -Refill Semaglutide-Weight Management (WEGOVY) 0.25 MG/0.5ML SOAJ; Inject 0.25 mg into the skin once a week.  Dispense: 2 mL; Refill: 0  Mariacristina is currently in the action stage of change. As such, her goal is to continue with weight loss  efforts. She has agreed to the Category 4 Plan.   Exercise goals: All adults should avoid inactivity. Some physical activity is better than none, and adults who participate in any amount of physical activity gain some health benefits.  Behavioral modification strategies: increasing lean protein intake, increasing vegetables, meal planning and cooking strategies, better snacking choices, and planning for success.  Nialah has agreed to follow-up with our clinic in 3 weeks. She was informed of the importance of frequent follow-up visits to maximize her success with intensive lifestyle modifications for her multiple health conditions.   Objective:   Blood pressure 139/75, pulse 77, temperature 98.5 F (36.9 C), height '5\' 6"'$  (1.676 m), weight 189 lb (85.7 kg), SpO2 98 %. Body mass index is 30.51 kg/m.  General: Cooperative, alert, well developed, in no acute distress. HEENT: Conjunctivae and lids unremarkable. Cardiovascular: Regular rhythm.  Lungs: Normal work of breathing. Neurologic: No focal deficits.   Lab Results  Component Value Date   CREATININE 0.91 09/03/2021   BUN 15 09/03/2021   NA 137 09/03/2021   K 4.3 09/03/2021   CL 102 09/03/2021   CO2 25 09/03/2021   Lab Results  Component Value Date   ALT 10 09/03/2021   AST 14 09/03/2021   ALKPHOS 29 (L) 09/03/2021   BILITOT 0.3 09/03/2021   Lab Results  Component Value Date   HGBA1C 5.7 (H) 01/31/2022   HGBA1C 5.5 03/27/2021   HGBA1C 5.6 07/10/2020  HGBA1C 5.5 02/09/2020   Lab Results  Component Value Date   INSULIN 8.4 01/31/2022   INSULIN 13.4 07/10/2020   INSULIN 13.5 02/09/2020   Lab Results  Component Value Date   TSH 2.67 03/27/2021   Lab Results  Component Value Date   CHOL 230 (H) 01/31/2022   HDL 66 01/31/2022   LDLCALC 149 (H) 01/31/2022   TRIG 84 01/31/2022   CHOLHDL 3 03/27/2021   Lab Results  Component Value Date   VD25OH 37.2 01/31/2022   VD25OH 87.07 03/27/2021   VD25OH 52.2 07/10/2020    Lab Results  Component Value Date   WBC 8.7 09/03/2021   HGB 13.7 09/03/2021   HCT 42.0 09/03/2021   MCV 87.7 09/03/2021   PLT 398.0 09/03/2021   No results found for: "IRON", "TIBC", "FERRITIN"  Attestation Statements:   Reviewed by clinician on day of visit: allergies, medications, problem list, medical history, surgical history, family history, social history, and previous encounter notes.  Time spent on visit including pre-visit chart review and post-visit care and charting was 25 minutes.   I, Elnora Morrison, RMA am acting as transcriptionist for Coralie Common, MD.  I have reviewed the above documentation for accuracy and completeness, and I agree with the above. - Coralie Common, MD

## 2022-03-20 ENCOUNTER — Encounter (INDEPENDENT_AMBULATORY_CARE_PROVIDER_SITE_OTHER): Payer: Self-pay | Admitting: Family Medicine

## 2022-03-20 ENCOUNTER — Ambulatory Visit (INDEPENDENT_AMBULATORY_CARE_PROVIDER_SITE_OTHER): Payer: BC Managed Care – PPO | Admitting: Family Medicine

## 2022-03-20 ENCOUNTER — Encounter (INDEPENDENT_AMBULATORY_CARE_PROVIDER_SITE_OTHER): Payer: Self-pay

## 2022-03-20 ENCOUNTER — Other Ambulatory Visit (INDEPENDENT_AMBULATORY_CARE_PROVIDER_SITE_OTHER): Payer: Self-pay | Admitting: Family Medicine

## 2022-03-20 ENCOUNTER — Other Ambulatory Visit (HOSPITAL_COMMUNITY): Payer: Self-pay

## 2022-03-20 VITALS — BP 112/77 | HR 74 | Temp 98.6°F | Ht 66.0 in | Wt 187.0 lb

## 2022-03-20 DIAGNOSIS — R632 Polyphagia: Secondary | ICD-10-CM | POA: Diagnosis not present

## 2022-03-20 DIAGNOSIS — E669 Obesity, unspecified: Secondary | ICD-10-CM | POA: Diagnosis not present

## 2022-03-20 DIAGNOSIS — E559 Vitamin D deficiency, unspecified: Secondary | ICD-10-CM

## 2022-03-20 DIAGNOSIS — Z6830 Body mass index (BMI) 30.0-30.9, adult: Secondary | ICD-10-CM

## 2022-03-20 MED ORDER — NALTREXONE HCL 50 MG PO TABS: 25.0000 mg | ORAL_TABLET | Freq: Every day | ORAL | 0 refills | Status: DC

## 2022-03-20 MED ORDER — NALTREXONE HCL 50 MG PO TABS
25.0000 mg | ORAL_TABLET | Freq: Every day | ORAL | 0 refills | Status: DC
  Filled 2022-03-20: qty 15, 30d supply, fill #0

## 2022-03-20 NOTE — Progress Notes (Signed)
Chief Complaint:   OBESITY Diana King is here to discuss her progress with her obesity treatment plan along with follow-up of her obesity related diagnoses. Senna is on the Category 4 Plan and states she is following her eating plan approximately 80% of the time. [name] states she is HIIT or weight training 45 minutes 3-4 times per week.  Today's visit was #: 21 Starting weight: 207 lbs Starting date: 02/09/2020 Today's weight: 187 lbs Today's date: 03/20/2022 Total lbs lost to date: 20 Total lbs lost since last in-office visit: 2  Interim History: Patient is very frustrated and angry today.  She is fighting with hunger and cravings throughout the day.  She is currently engaging in more reading which she is using as a coping mechanism to the hunger and craving.  She did weigh this morning at home.  She is trying to stay mindful of food choices and quantity.  Daughter is starting baseball and soccer on March 11.  Previously took [place] with great results in terms of controlling the food noise.  Couldn't tolerate combo lomaira and topiramate due to mental cloudiness and decreased concentration.  Still doing [place] and is really enjoying it.  Subjective:   1. Polyphagia Diana King previously tried Qsymia but had to stop due to side effects.  She was on [facility] with great success.  2. Vitamin D deficiency Diana King denies nausea, vomiting, and muscle weakness but notes fatigue. She is on prescription Vit D.  Assessment/Plan:   1. Polyphagia Intensive lifestyle modifications are the first line treatment for this issue. We discussed several lifestyle modifications today and she will continue to work on diet, exercise and weight loss efforts. Orders and follow up as documented in patient record. Start Naltrexone 50 mg as directed. Side effects discussed and pt agreed to try the medication.  Counseling Polyphagia is excessive hunger. Causes can include: low blood sugars,  hypERthyroidism, PMS, lack of sleep, stress, insulin resistance, diabetes, certain medications, and diets that are deficient in protein and fiber.   Start- naltrexone (DEPADE) 50 MG tablet; Take 1/2 tablet (25 mg total) by mouth daily.  Dispense: 30 tablet; Refill: 0  2. Vitamin D deficiency Low Vitamin D level contributes to fatigue and are associated with obesity, breast, and colon cancer. She agrees to continue to take prescription Vitamin D @50 ,000 IU every week and will follow-up for routine testing of Vitamin D, at least 2-3 times per year to avoid over-replacement.  3. Obesity with current BMI of 30.3 Diana King is currently in the action stage of change. As such, her goal is to continue with weight loss efforts. She has agreed to the Category 4 Plan.   Exercise goals:  As is  Behavioral modification strategies: increasing lean protein intake, meal planning and cooking strategies, better snacking choices, and planning for success.  Diana King has agreed to follow-up with our clinic in 3-4 weeks. She was informed of the importance of frequent follow-up visits to maximize her success with intensive lifestyle modifications for her multiple health conditions.   Objective:   Blood pressure 112/77, pulse 74, temperature 98.6 F (37 C), height 5\' 6"  (1.676 m), weight 187 lb (84.8 kg), SpO2 96 %. Body mass index is 30.18 kg/m.  General: Cooperative, alert, well developed, in no acute distress. HEENT: Conjunctivae and lids unremarkable. Cardiovascular: Regular rhythm.  Lungs: Normal work of breathing. Neurologic: No focal deficits.   Lab Results  Component Value Date   CREATININE 0.98 04/03/2022   BUN 19  04/03/2022   NA 135 04/03/2022   K 4.5 04/03/2022   CL 101 04/03/2022   CO2 24 04/03/2022   Lab Results  Component Value Date   ALT 13 04/03/2022   AST 17 04/03/2022   ALKPHOS 34 (L) 04/03/2022   BILITOT 0.3 04/03/2022   Lab Results  Component Value Date   HGBA1C 5.7 (H)  01/31/2022   HGBA1C 5.5 03/27/2021   HGBA1C 5.6 07/10/2020   HGBA1C 5.5 02/09/2020   Lab Results  Component Value Date   INSULIN 8.4 01/31/2022   INSULIN 13.4 07/10/2020   INSULIN 13.5 02/09/2020   Lab Results  Component Value Date   TSH 2.42 04/03/2022   Lab Results  Component Value Date   CHOL 230 (H) 01/31/2022   HDL 66 01/31/2022   LDLCALC 149 (H) 01/31/2022   TRIG 84 01/31/2022   CHOLHDL 3 03/27/2021   Lab Results  Component Value Date   VD25OH 37.2 01/31/2022   VD25OH 87.07 03/27/2021   VD25OH 52.2 07/10/2020   Lab Results  Component Value Date   WBC 8.1 04/03/2022   HGB 14.6 04/03/2022   HCT 44.4 04/03/2022   MCV 86.9 04/03/2022   PLT 421.0 (H) 04/03/2022    Attestation Statements:   Reviewed by clinician on day of visit: allergies, medications, problem list, medical history, surgical history, family history, social history, and previous encounter notes.  I, Kathlene November, BS, CMA, am acting as transcriptionist for Coralie Common, MD.   I have reviewed the above documentation for accuracy and completeness, and I agree with the above. - Coralie Common, MD

## 2022-03-20 NOTE — Telephone Encounter (Signed)
Please review

## 2022-04-02 ENCOUNTER — Encounter: Payer: Self-pay | Admitting: Internal Medicine

## 2022-04-02 DIAGNOSIS — R7303 Prediabetes: Secondary | ICD-10-CM | POA: Insufficient documentation

## 2022-04-02 NOTE — Patient Instructions (Addendum)
Blood work was ordered.   The lab is on the first floor.    Medications changes include :   taper off citalopram.  Start lexapro.     Return in about 6 months (around 10/04/2022) for follow up.   Health Maintenance, Female Adopting a healthy lifestyle and getting preventive care are important in promoting health and wellness. Ask your health care provider about: The right schedule for you to have regular tests and exams. Things you can do on your own to prevent diseases and keep yourself healthy. What should I know about diet, weight, and exercise? Eat a healthy diet  Eat a diet that includes plenty of vegetables, fruits, low-fat dairy products, and lean protein. Do not eat a lot of foods that are high in solid fats, added sugars, or sodium. Maintain a healthy weight Body mass index (BMI) is used to identify weight problems. It estimates body fat based on height and weight. Your health care provider can help determine your BMI and help you achieve or maintain a healthy weight. Get regular exercise Get regular exercise. This is one of the most important things you can do for your health. Most adults should: Exercise for at least 150 minutes each week. The exercise should increase your heart rate and make you sweat (moderate-intensity exercise). Do strengthening exercises at least twice a week. This is in addition to the moderate-intensity exercise. Spend less time sitting. Even light physical activity can be beneficial. Watch cholesterol and blood lipids Have your blood tested for lipids and cholesterol at 34 years of age, then have this test every 5 years. Have your cholesterol levels checked more often if: Your lipid or cholesterol levels are high. You are older than 34 years of age. You are at high risk for heart disease. What should I know about cancer screening? Depending on your health history and family history, you may need to have cancer screening at various ages.  This may include screening for: Breast cancer. Cervical cancer. Colorectal cancer. Skin cancer. Lung cancer. What should I know about heart disease, diabetes, and high blood pressure? Blood pressure and heart disease High blood pressure causes heart disease and increases the risk of stroke. This is more likely to develop in people who have high blood pressure readings or are overweight. Have your blood pressure checked: Every 3-5 years if you are 83-37 years of age. Every year if you are 64 years old or older. Diabetes Have regular diabetes screenings. This checks your fasting blood sugar level. Have the screening done: Once every three years after age 61 if you are at a normal weight and have a low risk for diabetes. More often and at a younger age if you are overweight or have a high risk for diabetes. What should I know about preventing infection? Hepatitis B If you have a higher risk for hepatitis B, you should be screened for this virus. Talk with your health care provider to find out if you are at risk for hepatitis B infection. Hepatitis C Testing is recommended for: Everyone born from [birth year] through 1967. Anyone with known risk factors for hepatitis C. Sexually transmitted infections (STIs) Get screened for STIs, including gonorrhea and chlamydia, if: You are sexually active and are younger than 34 years of age. You are older than 34 years of age and your health care provider tells you that you are at risk for this type of infection. Your sexual activity has changed since you were last  screened, and you are at increased risk for chlamydia or gonorrhea. Ask your health care provider if you are at risk. Ask your health care provider about whether you are at high risk for HIV. Your health care provider may recommend a prescription medicine to help prevent HIV infection. If you choose to take medicine to prevent HIV, you should first get tested for HIV. You should then be tested every 3  months for as long as you are taking the medicine. Pregnancy If you are about to stop having your period (premenopausal) and you may become pregnant, seek counseling before you get pregnant. Take 400 to 800 micrograms (mcg) of folic acid every day if you become pregnant. Ask for birth control (contraception) if you want to prevent pregnancy. Osteoporosis and menopause Osteoporosis is a disease in which the bones lose minerals and strength with aging. This can result in bone fractures. If you are 63 years old or older, or if you are at risk for osteoporosis and fractures, ask your health care provider if you should: Be screened for bone loss. Take a calcium or vitamin D supplement to lower your risk of fractures. Be given hormone replacement therapy (HRT) to treat symptoms of menopause. Follow these instructions at home: Alcohol use Do not drink alcohol if: Your health care provider tells you not to drink. You are pregnant, may be pregnant, or are planning to become pregnant. If you drink alcohol: Limit how much you have to: 0-1 drink a day. Know how much alcohol is in your drink. In the [place] one drink equals one 12 oz bottle of beer (355 mL), one 5 oz glass of wine (148 mL), or one 1 oz glass of hard liquor (44 mL). Lifestyle Do not use any products that contain nicotine or tobacco. These products include cigarettes, chewing tobacco, and vaping devices, such as e-cigarettes. If you need help quitting, ask your health care provider. Do not use street drugs. Do not share needles. Ask your health care provider for help if you need support or information about quitting drugs. General instructions Schedule regular health, dental, and eye exams. Stay current with your vaccines. Tell your health care provider if: You often feel depressed. You have ever been abused or do not feel safe at home. Summary Adopting a healthy lifestyle and getting preventive care are important in promoting health  and wellness. Follow your health care provider's instructions about healthy diet, exercising, and getting tested or screened for diseases. Follow your health care provider's instructions on monitoring your cholesterol and blood pressure. This information is not intended to replace advice given to you by your health care provider. Make sure you discuss any questions you have with your health care provider. Document Revised: 05/29/2020 Document Reviewed: 05/29/2020 Elsevier Patient Education  [place].

## 2022-04-03 ENCOUNTER — Ambulatory Visit (INDEPENDENT_AMBULATORY_CARE_PROVIDER_SITE_OTHER): Payer: BC Managed Care – PPO | Admitting: Internal Medicine

## 2022-04-03 VITALS — BP 112/78 | HR 70 | Temp 97.9°F | Wt 190.0 lb

## 2022-04-03 DIAGNOSIS — F32A Depression, unspecified: Secondary | ICD-10-CM

## 2022-04-03 DIAGNOSIS — F419 Anxiety disorder, unspecified: Secondary | ICD-10-CM

## 2022-04-03 DIAGNOSIS — R7303 Prediabetes: Secondary | ICD-10-CM

## 2022-04-03 DIAGNOSIS — Z0000 Encounter for general adult medical examination without abnormal findings: Secondary | ICD-10-CM

## 2022-04-03 LAB — CBC WITH DIFFERENTIAL/PLATELET
Basophils Absolute: 0.1 10*3/uL (ref 0.0–0.1)
Basophils Relative: 1.1 % (ref 0.0–3.0)
Eosinophils Absolute: 0.2 10*3/uL (ref 0.0–0.7)
Eosinophils Relative: 2.9 % (ref 0.0–5.0)
HCT: 44.4 % (ref 36.0–46.0)
Hemoglobin: 14.6 g/dL (ref 12.0–15.0)
Lymphocytes Relative: 39.6 % (ref 12.0–46.0)
Lymphs Abs: 3.2 10*3/uL (ref 0.7–4.0)
MCHC: 32.9 g/dL (ref 30.0–36.0)
MCV: 86.9 fl (ref 78.0–100.0)
Monocytes Absolute: 0.6 10*3/uL (ref 0.1–1.0)
Monocytes Relative: 7.9 % (ref 3.0–12.0)
Neutro Abs: 4 10*3/uL (ref 1.4–7.7)
Neutrophils Relative %: 48.5 % (ref 43.0–77.0)
Platelets: 421 10*3/uL — ABNORMAL HIGH (ref 150.0–400.0)
RBC: 5.11 Mil/uL (ref 3.87–5.11)
RDW: 12.9 % (ref 11.5–15.5)
WBC: 8.1 10*3/uL (ref 4.0–10.5)

## 2022-04-03 LAB — COMPREHENSIVE METABOLIC PANEL
ALT: 13 U/L (ref 0–35)
AST: 17 U/L (ref 0–37)
Albumin: 4.2 g/dL (ref 3.5–5.2)
Alkaline Phosphatase: 34 U/L — ABNORMAL LOW (ref 39–117)
BUN: 19 mg/dL (ref 6–23)
CO2: 24 mEq/L (ref 19–32)
Calcium: 9.4 mg/dL (ref 8.4–10.5)
Chloride: 101 mEq/L (ref 96–112)
Creatinine, Ser: 0.98 mg/dL (ref 0.40–1.20)
GFR: 75.54 mL/min (ref >60.00)
Glucose, Bld: 92 mg/dL (ref 70–99)
Potassium: 4.5 mEq/L (ref 3.5–5.1)
Sodium: 135 mEq/L (ref 135–145)
Total Bilirubin: 0.3 mg/dL (ref 0.2–1.2)
Total Protein: 7.6 g/dL (ref 6.0–8.3)

## 2022-04-03 LAB — TSH: TSH: 2.42 u[IU]/mL (ref 0.35–5.50)

## 2022-04-03 MED ORDER — ESCITALOPRAM OXALATE 10 MG PO TABS: 10.0000 mg | ORAL_TABLET | Freq: Every day | ORAL | 1 refills | Status: DC

## 2022-04-03 MED ORDER — BUSPIRONE HCL 5 MG PO TABS: 10.0000 mg | ORAL_TABLET | Freq: Two times a day (BID) | ORAL | 3 refills | Status: DC

## 2022-04-03 MED ORDER — BUPROPION HCL ER (XL) 300 MG PO TB24: 300.0000 mg | ORAL_TABLET | Freq: Every day | ORAL | 3 refills | Status: DC

## 2022-04-03 NOTE — Assessment & Plan Note (Signed)
Chronic Not ideally controlled Would like to come off the citalopram and retry Lexapro, which was effective but did give her RLS Taper off citalopram Start Lexapro 10 mg daily-may even benefit from 5 mg daily Continue bupropion XL 300 mg daily Continue buspirone 10 mg twice daily as needed She plans to start seeing a therapist soon-has an appointment.  I think this will be very beneficial

## 2022-04-03 NOTE — Progress Notes (Signed)
Subjective:    Patient ID: Diana King, female    DOB: [birth year], 32 y.o.   MRN: [id number]      HPI Diana King is here for a Physical exam and her chronic medical problems.    Doing well, stress is high.     Medications and allergies reviewed with patient and updated if appropriate.  Current Outpatient Medications on File Prior to Visit  Medication Sig Dispense Refill   Adapalene 0.3 % gel Apply 1 application topically at bedtime. 45 g 5   buPROPion (WELLBUTRIN XL) 300 MG 24 hr tablet Take 1 tablet (300 mg total) by mouth daily. 90 tablet 3   busPIRone (BUSPAR) 5 MG tablet Take 4 tablets (20 mg total) by mouth 2 (two) times daily. 180 tablet 3   citalopram (CELEXA) 20 MG tablet Take 20 mg by mouth daily.     drospirenone-ethinyl estradiol (YAZ,GIANVI,LORYNA) 3-0.02 MG tablet Take 1 tablet by mouth daily.     meloxicam (MOBIC) 15 MG tablet Take 1 tablet (15 mg total) by mouth daily. 30 tablet 0   naltrexone (DEPADE) 50 MG tablet Take 1/2 tablet (25 mg total) by mouth daily. 30 tablet 0   spironolactone (ALDACTONE) 50 MG tablet Take 50 mg by mouth daily.     tiZANidine (ZANAFLEX) 4 MG tablet TAKE 1 TABLET BY MOUTH EVERYDAY AT BEDTIME 90 tablet 1   Vitamin D, Ergocalciferol, (DRISDOL) 1.25 MG (50000 UNIT) CAPS capsule Take 1 capsule (50,000 Units total) by mouth every 7 (seven) days. 12 capsule 0   No current facility-administered medications on file prior to visit.    Review of Systems  Constitutional:  Negative for fever.  Eyes:  Negative for visual disturbance.  Respiratory:  Negative for cough, shortness of breath and wheezing.   Cardiovascular:  Negative for chest pain, palpitations and leg swelling.  Gastrointestinal:  Positive for diarrhea (frequent - chronic). Negative for abdominal pain, blood in stool and constipation.       No gerd  Genitourinary:  Negative for dysuria.  Musculoskeletal:  Negative for arthralgias and back pain.  Skin:  Negative for rash.   Neurological:  Negative for light-headedness and headaches.  Psychiatric/Behavioral:  Positive for dysphoric mood. The patient is nervous/anxious.        Objective:   Vitals:   04/03/22 0752  BP: 112/78  Pulse: 70  Temp: 97.9 F (36.6 C)  SpO2: 98%   Filed Weights   04/03/22 0752  Weight: 190 lb (86.2 kg)   Body mass index is 30.67 kg/m.  BP Readings from Last 3 Encounters:  04/03/22 112/78  03/20/22 112/77  02/26/22 139/75    Wt Readings from Last 3 Encounters:  04/03/22 190 lb (86.2 kg)  03/20/22 187 lb (84.8 kg)  02/26/22 189 lb (85.7 kg)       Physical Exam Constitutional: She appears well-developed and well-nourished. No distress.  HENT:  Head: Normocephalic and atraumatic.  Right Ear: External ear normal. Normal ear canal and TM Left Ear: External ear normal.  Normal ear canal and TM Mouth/Throat: Oropharynx is clear and moist.  Eyes: Conjunctivae normal.  Neck: Neck supple. No tracheal deviation present. No thyromegaly present.  No carotid bruit  Cardiovascular: Normal rate, regular rhythm and normal heart sounds.   No murmur heard.  No edema. Pulmonary/Chest: Effort normal and breath sounds normal. No respiratory distress. She has no wheezes. She has no rales.  Breast: deferred   Abdominal: Soft. She exhibits no distension. There is no  tenderness.  Lymphadenopathy: She has no cervical adenopathy.  Skin: Skin is warm and dry. She is not diaphoretic.  Psychiatric: She has a normal mood and affect. Her behavior is normal.     Lab Results  Component Value Date   WBC 8.7 09/03/2021   HGB 13.7 09/03/2021   HCT 42.0 09/03/2021   PLT 398.0 09/03/2021   GLUCOSE 85 09/03/2021   CHOL 230 (H) 01/31/2022   TRIG 84 01/31/2022   HDL 66 01/31/2022   LDLCALC 149 (H) 01/31/2022   ALT 10 09/03/2021   AST 14 09/03/2021   NA 137 09/03/2021   K 4.3 09/03/2021   CL 102 09/03/2021   CREATININE 0.91 09/03/2021   BUN 15 09/03/2021   CO2 25 09/03/2021   TSH  2.67 03/27/2021   HGBA1C 5.7 (H) 01/31/2022         Assessment & Plan:   Physical exam: Screening blood work  ordered Exercise  none for two weeks, but usually pretty regular Weight  working on weight loss Substance abuse  none   Reviewed recommended immunizations.   Health Maintenance  Topic Date Due   COVID-19 Vaccine (2 - Pfizer risk series) 01/07/2020   PAP SMEAR-Modifier  03/23/2022   DTaP/Tdap/Td (6 - Td or Tdap) 08/23/2023   INFLUENZA VACCINE  Completed   Hepatitis C Screening  Completed   HIV Screening  Completed   HPV VACCINES  Aged Out          See Problem List for Assessment and Plan of chronic medical problems.

## 2022-04-03 NOTE — Assessment & Plan Note (Signed)
Lab Results  Component Value Date   HGBA1C 5.7 (H) 01/31/2022   Working on weight loss Low sugar/carbohydrate diet, regular exercise

## 2022-04-10 ENCOUNTER — Ambulatory Visit: Payer: BC Managed Care – PPO | Admitting: Sports Medicine

## 2022-04-10 VITALS — BP 120/78 | HR 91 | Ht 66.0 in | Wt 190.0 lb

## 2022-04-10 DIAGNOSIS — M9902 Segmental and somatic dysfunction of thoracic region: Secondary | ICD-10-CM

## 2022-04-10 DIAGNOSIS — M9908 Segmental and somatic dysfunction of rib cage: Secondary | ICD-10-CM | POA: Diagnosis not present

## 2022-04-10 DIAGNOSIS — M9901 Segmental and somatic dysfunction of cervical region: Secondary | ICD-10-CM | POA: Diagnosis not present

## 2022-04-10 DIAGNOSIS — M546 Pain in thoracic spine: Secondary | ICD-10-CM

## 2022-04-10 NOTE — Patient Instructions (Signed)
Good to see you   

## 2022-04-10 NOTE — Progress Notes (Signed)
Diana King Phone: [phone]   Assessment and Plan:     1. Acute right-sided thoracic back pain 2. Somatic dysfunction of cervical region 3. Somatic dysfunction of thoracic region 4. Somatic dysfunction of rib region  -Acute, uncomplicated, initial sports medicine visit - Most consistent with thoracic dysfunction and right trapezius strain likely occurring during sleep - Patient has received significant relief with OMT in the past.  Elects for repeat OMT today.  Tolerated well per note below. - Decision today to treat with OMT was based on Physical Exam   After verbal consent patient was treated with HVLA (high velocity low amplitude), ME (muscle energy), FPR (flex positional release), ST (soft tissue),  techniques in cervical, rib, thoracic, areas. Patient tolerated the procedure well with improvement in symptoms.  Patient educated on potential side effects of soreness and recommended to rest, hydrate, and use Tylenol as needed for pain control.   Pertinent previous records reviewed include none   Follow Up: As needed   Subjective:   I, Diana King, am serving as a Education administrator for Doctor Glennon Mac  Chief Complaint: neck pain   HPI:   04/10/22 [name] complaining of neck pain. Patient states woke up Monday not able to turn her head to the right without a sharp pain at the base of skull on the right side. yesterday patient was able to turn head to the right but not the left, by the end of the day had to turn her whole body to look in another direction. today is better, but the pain is still bothersome and now radiating to right shoulder blade. patient has been taking ibuprofen, tizanidine, using heat, and some ROM exercises/stretches.   Relevant Historical Information: None pertinent  Additional pertinent review of systems negative.  Current Outpatient Medications   Medication Sig Dispense Refill   Adapalene 0.3 % gel Apply 1 application topically at bedtime. 45 g 5   buPROPion (WELLBUTRIN XL) 300 MG 24 hr tablet Take 1 tablet (300 mg total) by mouth daily. 90 tablet 3   busPIRone (BUSPAR) 5 MG tablet Take 2 tablets (10 mg total) by mouth 2 (two) times daily. 180 tablet 3   drospirenone-ethinyl estradiol (YAZ,GIANVI,LORYNA) 3-0.02 MG tablet Take 1 tablet by mouth daily.     escitalopram (LEXAPRO) 10 MG tablet Take 1 tablet (10 mg total) by mouth daily. 90 tablet 1   meloxicam (MOBIC) 15 MG tablet Take 1 tablet (15 mg total) by mouth daily. 30 tablet 0   naltrexone (DEPADE) 50 MG tablet Take 1/2 tablet (25 mg total) by mouth daily. 30 tablet 0   spironolactone (ALDACTONE) 50 MG tablet Take 50 mg by mouth daily.     tiZANidine (ZANAFLEX) 4 MG tablet TAKE 1 TABLET BY MOUTH EVERYDAY AT BEDTIME 90 tablet 1   Vitamin D, Ergocalciferol, (DRISDOL) 1.25 MG (50000 UNIT) CAPS capsule Take 1 capsule (50,000 Units total) by mouth every 7 (seven) days. 12 capsule 0   No current facility-administered medications for this visit.      Objective:     Vitals:   04/10/22 1455  BP: 120/78  Pulse: 91  SpO2: 99%  Weight: 190 lb (86.2 kg)  Height: 5\' 6"  (1.676 m)      Body mass index is 30.67 kg/m.    Physical Exam:     General: Well-appearing, cooperative, sitting comfortably in no acute distress.   OMT  Physical Exam:    Cervical: TTP paraspinal, C3 RLSR Rib: Right elevated first rib with TTP Thoracic: TTP paraspinal, T2-4 RRSL which was patient's painful segment and primary etiology of pain  Electronically signed by:  Diana Mccreedy D.[facility] 4:27 PM 04/10/22

## 2022-04-11 ENCOUNTER — Ambulatory Visit (INDEPENDENT_AMBULATORY_CARE_PROVIDER_SITE_OTHER): Payer: BC Managed Care – PPO | Admitting: Family Medicine

## 2022-04-11 ENCOUNTER — Encounter (INDEPENDENT_AMBULATORY_CARE_PROVIDER_SITE_OTHER): Payer: Self-pay | Admitting: Family Medicine

## 2022-04-11 VITALS — BP 133/83 | HR 75 | Temp 98.4°F | Ht 66.0 in | Wt 189.0 lb

## 2022-04-11 DIAGNOSIS — Z6830 Body mass index (BMI) 30.0-30.9, adult: Secondary | ICD-10-CM | POA: Diagnosis not present

## 2022-04-11 DIAGNOSIS — E7849 Other hyperlipidemia: Secondary | ICD-10-CM | POA: Diagnosis not present

## 2022-04-11 DIAGNOSIS — E669 Obesity, unspecified: Secondary | ICD-10-CM | POA: Diagnosis not present

## 2022-04-11 NOTE — Progress Notes (Deleted)
Patient had a major depressive episode for about a week and a half since last appointment. Changed from celexa to lexapro. She tried to figure out why things felt so overwhelming.  She did buy a craft project to keep her hands busy.  She switched when she went to [facility] and is going in the am now.  She started walking at lunch to get herself out of the office and get away from the computer.  She has her next therapy session next week. She has gotten more consistent nutritious choices in this week.  She is trying to include her husband in more in food prep.

## 2022-04-16 NOTE — Progress Notes (Signed)
Chief Complaint:   OBESITY Diana King is here to discuss her progress with her obesity treatment plan along with follow-up of her obesity related diagnoses. Diana King is on the Category 4 plan and states she is following her eating plan approximately 68% of the time. Diana King states she is doing burn [facility] for 45 minutes twice weekly and walking for 30 minutes 7 times per week.  Today's visit was #: 60 Starting weight: 207 lbs Starting date: 02/09/2020 Today's weight: 189 lbs Today's date: 04/11/2022 Total lbs lost to date: 18 Total lbs lost since last in-office visit: +2  Interim History: Patient had a major depressive episode for about a week and a half since last appointment. Changed from celexa to lexapro. She tried to figure out why things felt so overwhelming.  She did buy a craft project to keep her hands busy.  She switched when she went to [facility] and is going in the am now.  She started walking at lunch to get herself out of the office and get away from the computer.  She has her next therapy session next week. She has gotten more consistent nutritious choices in this week.  She is trying to include her husband in more in food prep.  Subjective:   1. Other hyperlipidemia Not on medications. Last LDL 149 in January.  Assessment/Plan:   1. Other hyperlipidemia Repeat labs in 1 month.  2. Obesity with current BMI of 30.6 Diana King is currently in the action stage of change. As such, her goal is to continue with weight loss efforts. She has agreed to the Category 4 plan.  Exercise goals: As is  Behavioral modification strategies: increasing lean protein intake, meal planning and cooking strategies, keeping healthy foods in the home, and planning for success.  Diana King has agreed to follow-up with our clinic in 4 weeks. She was informed of the importance of frequent follow-up visits to maximize her success with intensive lifestyle modifications for her multiple health  conditions.  Objective:   Blood pressure 133/83, pulse 75, temperature 98.4 F (36.9 C), height 5\' 6"  (1.676 m), weight 189 lb (85.7 kg), SpO2 98 %. Body mass index is 30.51 kg/m.  General: Cooperative, alert, well developed, in no acute distress. HEENT: Conjunctivae and lids unremarkable. Cardiovascular: Regular rhythm.  Lungs: Normal work of breathing. Neurologic: No focal deficits.   Lab Results  Component Value Date   CREATININE 0.98 04/03/2022   BUN 19 04/03/2022   NA 135 04/03/2022   K 4.5 04/03/2022   CL 101 04/03/2022   CO2 24 04/03/2022   Lab Results  Component Value Date   ALT 13 04/03/2022   AST 17 04/03/2022   ALKPHOS 34 (L) 04/03/2022   BILITOT 0.3 04/03/2022   Lab Results  Component Value Date   HGBA1C 5.7 (H) 01/31/2022   HGBA1C 5.5 03/27/2021   HGBA1C 5.6 07/10/2020   HGBA1C 5.5 02/09/2020   Lab Results  Component Value Date   INSULIN 8.4 01/31/2022   INSULIN 13.4 07/10/2020   INSULIN 13.5 02/09/2020   Lab Results  Component Value Date   TSH 2.42 04/03/2022   Lab Results  Component Value Date   CHOL 230 (H) 01/31/2022   HDL 66 01/31/2022   LDLCALC 149 (H) 01/31/2022   TRIG 84 01/31/2022   CHOLHDL 3 03/27/2021   Lab Results  Component Value Date   VD25OH 37.2 01/31/2022   VD25OH 87.07 03/27/2021   VD25OH 52.2 07/10/2020   Lab Results  Component Value Date   WBC 8.1 04/03/2022   HGB 14.6 04/03/2022   HCT 44.4 04/03/2022   MCV 86.9 04/03/2022   PLT 421.0 (H) 04/03/2022   No results found for: "IRON", "TIBC", "FERRITIN"  Attestation Statements:   Reviewed by clinician on day of visit: allergies, medications, problem list, medical history, surgical history, family history, social history, and previous encounter notes.  I, Dawn Whitmire, FNP-C, am acting as Energy manager for [name], MD.  I have reviewed the above documentation for accuracy and completeness, and I agree with the above. - [name],  MD

## 2022-04-17 ENCOUNTER — Ambulatory Visit (INDEPENDENT_AMBULATORY_CARE_PROVIDER_SITE_OTHER): Payer: BC Managed Care – PPO | Admitting: Behavioral Health

## 2022-04-17 DIAGNOSIS — F32A Depression, unspecified: Secondary | ICD-10-CM | POA: Diagnosis not present

## 2022-04-17 DIAGNOSIS — F419 Anxiety disorder, unspecified: Secondary | ICD-10-CM

## 2022-04-17 NOTE — Progress Notes (Signed)
                Charvis Lightner L Paydon Carll, LMFT 

## 2022-04-17 NOTE — Progress Notes (Signed)
[place] Counselor Initial Adult Exam  Name: Diana King Date: 04/17/2022 MRN: [id number] DOB: 06/15/1990 PCP: Binnie Rail, MD  Time spent: 60 min In Person @ [facility] [place]:  Self    Paperwork requested: No   Reason for Visit /Presenting Problem: Elevated anx/dep & Parenting stressors for [employer]  Mental Status Exam: Appearance:   Casual     Behavior:  Appropriate and Sharing  Motor:  Normal  Speech/Language:   Clear and Coherent and Normal Rate  Affect:  Appropriate  Mood:  normal  Thought process:  normal  Thought content:    WNL  Sensory/Perceptual disturbances:    WNL  Orientation:  oriented to person, place, and time/date  Attention:  Good  Concentration:  Good  Memory:  WNL  Fund of knowledge:   Good  Insight:    Good  Judgment:   Good  Impulse Control:  Good    Risk Assessment: Danger to Self:  No Self-injurious Behavior: No Danger to Others: No Duty to Warn:no Physical Aggression / Violence:No  Access to Firearms a concern: No  Gang Involvement:No  Patient / guardian was educated about steps to take if suicide or homicide risk level increases between visits: yes While future psychiatric events cannot be accurately predicted, the patient does not currently require acute inpatient psychiatric care and does not currently meet [facility] involuntary commitment criteria.  Substance Abuse History: Current substance abuse: No     Past Psychiatric History:   Previous psychological history is significant for anxiety and depression Outpatient Providers:Stacy Burns, MD History of Psych Hospitalization: No  Psychological Testing:  NA    Abuse History:  Victim of: No.,  NA    Report needed: No. Victim of Neglect:No. Perpetrator of  NA   Witness / Exposure to Domestic Violence: No   Protective Services Involvement: No  Witness to [employer] Violence:  No   Family History:  Family History  Problem  Relation Age of Onset   Fibromyalgia Mother    Interstitial cystitis Mother    Migraines Mother    Depression Mother    Anxiety disorder Mother    Alcohol abuse Maternal Aunt    Mental illness Maternal Aunt    Alcohol abuse Maternal Uncle    Thyroid disease Maternal Grandmother    Hyperlipidemia Father    Alcohol abuse Father     Living situation: the patient lives with their family  Sexual Orientation: Straight  Relationship Status: married  Name of spouse / other: Rachel Bo If a parent, number of children / ages: 41yo Dtr [place]: spouse friends Some Family  Financial Stress:  No   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 Pt sts she is easily irritated & often not nice to her Husb. She gets angry w/her Dtr @ times & this seems out of proportion to the situation. Pt mentioned interest in [place] Child work & letting her 62yo self have & exp the feelings @ that age. She wants to like herself again & expresses some levels of self-loathing. She also wants to be a better Wife to her Husb Rachel Bo.     Strengths: Supportive Relationships, Family, Self  Advocate, and Able to Communicate Effectively  Barriers:  None noted today   Legal History: Pending legal issue / charges: The patient has no significant history of legal issues. History of legal issue / charges:  NA  Medical History/Surgical History: reviewed Past Medical History:  Diagnosis Date   Acne    Anxiety    Back pain    Depression    GERD (gastroesophageal reflux disease)    IBS (irritable bowel syndrome)    Overweight    Pain in Achilles tendon     Palpitations     Past Surgical History:  Procedure Laterality Date   NO PAST SURGERIES      Medications: Current Outpatient Medications  Medication Sig Dispense Refill   Adapalene 0.3 % gel Apply 1 application topically at bedtime. 45 g 5   buPROPion (WELLBUTRIN XL) 300 MG 24 hr tablet Take 1 tablet (300 mg total) by mouth daily. 90 tablet 3   busPIRone (BUSPAR) 5 MG tablet Take 2 tablets (10 mg total) by mouth 2 (two) times daily. 180 tablet 3   drospirenone-ethinyl estradiol (YAZ,GIANVI,LORYNA) 3-0.02 MG tablet Take 1 tablet by mouth daily.     escitalopram (LEXAPRO) 10 MG tablet Take 1 tablet (10 mg total) by mouth daily. 90 tablet 1   meloxicam (MOBIC) 15 MG tablet Take 1 tablet (15 mg total) by mouth daily. 30 tablet 0   naltrexone (DEPADE) 50 MG tablet Take 1/2 tablet (25 mg total) by mouth daily. 30 tablet 0   spironolactone (ALDACTONE) 50 MG tablet Take 50 mg by mouth daily.     tiZANidine (ZANAFLEX) 4 MG tablet TAKE 1 TABLET BY MOUTH EVERYDAY AT BEDTIME 90 tablet 1   Vitamin D, Ergocalciferol, (DRISDOL) 1.25 MG (50000 UNIT) CAPS capsule Take 1 capsule (50,000 Units total) by mouth every 7 (seven) days. 12 capsule 0   No current facility-administered medications for this visit.    Allergies  Allergen Reactions   Latex Swelling   Topamax [Topiramate] Other (See Comments)   Gabapentin     Diagnoses:  Anxiety and depression  Plan of Care: Evia will attend all sessions as scheduled every 2-3 wks. She will work on the things suggested in psychotherapy. Shamyah will ask her Husb to join her in our next session.  Target Date: 05/21/2022  Progress: 4  Frequency: Twice monthly  Modality: Boykin Reaper, LMFT

## 2022-05-01 ENCOUNTER — Other Ambulatory Visit: Payer: Self-pay | Admitting: Internal Medicine

## 2022-05-06 ENCOUNTER — Ambulatory Visit: Payer: BC Managed Care – PPO | Admitting: Behavioral Health

## 2022-05-06 DIAGNOSIS — F419 Anxiety disorder, unspecified: Secondary | ICD-10-CM | POA: Diagnosis not present

## 2022-05-06 DIAGNOSIS — F32A Depression, unspecified: Secondary | ICD-10-CM | POA: Diagnosis not present

## 2022-05-06 NOTE — Progress Notes (Signed)
                Diana Puertas L Laurens Matheny, LMFT 

## 2022-05-06 NOTE — Progress Notes (Addendum)
[facility] Counselor/Therapist Progress Note  Patient ID: Diana King, MRN: [id number],    Date: 05/06/2022  Time Spent: 55 min In Person @ [facility] Office   Treatment Type: Individual Therapy  Reported Symptoms: Elevated anx/dep & stress  Mental Status Exam: Appearance:  Casual     Behavior: Appropriate and Sharing  Motor: Normal  Speech/Language:  Clear and Coherent  Affect: Appropriate  Mood: anxious  Thought process: normal  Thought content:   WNL  Sensory/Perceptual disturbances:   WNL  Orientation: oriented to person, place, and time/date  Attention: Good  Concentration: Good  Memory: WNL  Fund of knowledge:  Good  Insight:   Good  Judgment:  Good  Impulse Control: Good   Risk Assessment: Danger to Self:  No Self-injurious Behavior: No Danger to Others: No Duty to Warn:no Physical Aggression / Violence:No  Access to Firearms a concern: No  Gang Involvement:No   Subjective: Pt is frustrated today due to multiple concerns w/her health & her Family.   Interventions: Cognitive Behavioral Therapy and Solution-Oriented/Positive Psychology  PHQ-9: 18/27; endorsing vd-moderately severe w/no SI GAD-7:7/21; endorsing vd-severe w/o dread  Diagnosis:Anxiety and depression  Plan: Diana King wants to extend the number of good days she has past the wknd. She is frustrated w/her weight loss effors, & notices her eating habits are not the best. She is trying Weight Watchers w/a close friend. She can easily lose 20# by herself, but she is trying WW to get her goal weight down to 160#.  Target Date: 06/20/2022  Progress: 5  Frequency: Once every 3-4 wks  Modality: Claretta Fraise, LMFT

## 2022-05-09 ENCOUNTER — Ambulatory Visit (INDEPENDENT_AMBULATORY_CARE_PROVIDER_SITE_OTHER): Payer: BC Managed Care – PPO | Admitting: Family Medicine

## 2022-05-14 ENCOUNTER — Ambulatory Visit: Payer: BC Managed Care – PPO | Admitting: Behavioral Health

## 2022-05-16 ENCOUNTER — Ambulatory Visit: Payer: BC Managed Care – PPO | Admitting: Sports Medicine

## 2022-05-16 DIAGNOSIS — M79662 Pain in left lower leg: Secondary | ICD-10-CM | POA: Diagnosis not present

## 2022-05-16 DIAGNOSIS — M9902 Segmental and somatic dysfunction of thoracic region: Secondary | ICD-10-CM | POA: Diagnosis not present

## 2022-05-16 DIAGNOSIS — M9904 Segmental and somatic dysfunction of sacral region: Secondary | ICD-10-CM

## 2022-05-16 DIAGNOSIS — M79661 Pain in right lower leg: Secondary | ICD-10-CM

## 2022-05-16 DIAGNOSIS — M546 Pain in thoracic spine: Secondary | ICD-10-CM | POA: Diagnosis not present

## 2022-05-16 DIAGNOSIS — M9906 Segmental and somatic dysfunction of lower extremity: Secondary | ICD-10-CM

## 2022-05-16 DIAGNOSIS — M9905 Segmental and somatic dysfunction of pelvic region: Secondary | ICD-10-CM | POA: Diagnosis not present

## 2022-05-16 DIAGNOSIS — M9903 Segmental and somatic dysfunction of lumbar region: Secondary | ICD-10-CM | POA: Diagnosis not present

## 2022-05-16 DIAGNOSIS — G8929 Other chronic pain: Secondary | ICD-10-CM | POA: Diagnosis not present

## 2022-05-16 MED ORDER — MELOXICAM 15 MG PO TABS: 15.0000 mg | ORAL_TABLET | Freq: Every day | ORAL | 0 refills | Status: DC

## 2022-05-16 NOTE — Progress Notes (Signed)
Aleen Sells [name] [address] [place] [address] Phone: [phone]   Assessment and Plan:     1. Chronic bilateral thoracic back pain 2. Pain in left shin 3. Pain in right shin 4. Somatic dysfunction of thoracic region 5. Somatic dysfunction of lumbar region 6. Somatic dysfunction of pelvic region 7. Somatic dysfunction of sacral region 8. Somatic dysfunction of lower extremities -Chronic with exacerbation, subsequent visit - Primary concern of new onset of intense left shin pain and moderate right shin pain with intermittent flares of left knee pain and low back/middle back pain.  Because of new onset of shin pain is currently unclear, though may be related to walking in new pair of shoes. - Recommend returning to comfortable footwear that does not exacerbate pain - Start meloxicam 15 mg daily for 7 to 10 days.  Refill provided - Patient has received significant relief with OMT in the past.  Elects for repeat OMT today.  Tolerated well per note below. - Decision today to treat with OMT was based on Physical Exam  After verbal consent patient was treated with HVLA (high velocity low amplitude), ME (muscle energy), FPR (flex positional release), ST (soft tissue), PC/PD (Pelvic Compression/ Pelvic Decompression) techniques in lower extremity, sacrum, thoracic, lumbar, and pelvic areas. Patient tolerated the procedure well with improvement in symptoms.  Patient educated on potential side effects of soreness and recommended to rest, hydrate, and use Tylenol as needed for pain control.  Pertinent previous records reviewed include none   Follow Up: As needed if no improvement.  Could consider ultrasound of shins versus prolonged NSAID/prednisone course   Subjective:   I, Moenique Parris, am serving as a Neurosurgeon for Doctor Richardean Sale  Chief Complaint: neck pain    HPI:    04/10/22 [name] complaining of neck pain.  Patient states woke up Monday not able to turn her head to the right without a sharp pain at the base of skull on the right side. yesterday patient was able to turn head to the right but not the left, by the end of the day had to turn her whole body to look in another direction. today is better, but the pain is still bothersome and now radiating to right shoulder blade. patient has been taking ibuprofen, tizanidine, using heat, and some ROM exercises/stretches.   05/17/2022 Patient states needs an adjustment    Relevant Historical Information: None pertinent  Additional pertinent review of systems negative.  Current Outpatient Medications  Medication Sig Dispense Refill   Adapalene 0.3 % gel Apply 1 application topically at bedtime. 45 g 5   buPROPion (WELLBUTRIN XL) 300 MG 24 hr tablet Take 1 tablet (300 mg total) by mouth daily. 90 tablet 3   busPIRone (BUSPAR) 5 MG tablet TAKE 2 TABLETS BY MOUTH 2 TIMES DAILY. 360 tablet 2   drospirenone-ethinyl estradiol (YAZ,GIANVI,LORYNA) 3-0.02 MG tablet Take 1 tablet by mouth daily.     escitalopram (LEXAPRO) 10 MG tablet Take 1 tablet (10 mg total) by mouth daily. 90 tablet 1   meloxicam (MOBIC) 15 MG tablet Take 1 tablet (15 mg total) by mouth daily. 30 tablet 0   naltrexone (DEPADE) 50 MG tablet Take 1/2 tablet (25 mg total) by mouth daily. 30 tablet 0   spironolactone (ALDACTONE) 50 MG tablet Take 50 mg by mouth daily.     tiZANidine (ZANAFLEX) 4 MG tablet TAKE 1 TABLET BY MOUTH EVERYDAY AT BEDTIME  90 tablet 1   Vitamin D, Ergocalciferol, (DRISDOL) 1.25 MG (50000 UNIT) CAPS capsule Take 1 capsule (50,000 Units total) by mouth every 7 (seven) days. 12 capsule 0   No current facility-administered medications for this visit.      Objective:     Weight: 189lbs RR: 16   Physical Exam:     General: Well-appearing, cooperative, sitting comfortably in no acute distress.   OMT Physical Exam:  ASIS Compression Test: Positive left Sacrum:  Negative sphinx.  NTTP bilateral sacral base Lower extremity: TTP left medial tibial shaft.  NTTP musculature medial and lateral to medial tibia Thoracic: TTP paraspinal, R6 RRSR Lumbar: TTP paraspinal, L2 RLSL Pelvis: Large left posterior innominate  Electronically signed by:  Aleen Sells [name] 8:27 AM 05/16/22

## 2022-05-23 ENCOUNTER — Ambulatory Visit: Payer: BC Managed Care – PPO | Admitting: Family Medicine

## 2022-05-28 ENCOUNTER — Ambulatory Visit: Payer: BC Managed Care – PPO | Admitting: Behavioral Health

## 2022-05-28 DIAGNOSIS — F32A Depression, unspecified: Secondary | ICD-10-CM | POA: Diagnosis not present

## 2022-05-28 DIAGNOSIS — F419 Anxiety disorder, unspecified: Secondary | ICD-10-CM

## 2022-05-29 NOTE — Progress Notes (Unsigned)
[name] [address] [place] [address] Phone: [phone] Subjective:   Diana King, am serving as a scribe for Dr. Antoine Primas.  I'm seeing this patient by the request  of:  Pincus Sanes, MD  CC: Neck and back pain follow-up  [id number]  HORIZON NIJJAR is a 32 y.o. female coming in with complaint of back and neck pain. OMT 05/16/2022. Patient states that she is using Meloxicam due to recent shin, L knee and R piriformis. Feels tight today.   Medications patient has been prescribed: None  Taking:         Reviewed prior external information including notes and imaging from previsou exam, outside providers and external EMR if available.   As well as notes that were available from care everywhere and other healthcare systems.  Past medical history, social, surgical and family history all reviewed in electronic medical record.  No pertanent information unless stated regarding to the chief complaint.   Past Medical History:  Diagnosis Date   Acne    Anxiety    Back pain    Depression    GERD (gastroesophageal reflux disease)    IBS (irritable bowel syndrome)    Overweight    Pain in Achilles tendon    Palpitations     Allergies  Allergen Reactions   Latex Swelling   Topamax [Topiramate] Other (See Comments)   Gabapentin      Review of Systems:  No headache, visual changes, nausea, vomiting, diarrhea, constipation, dizziness, abdominal pain, skin rash, fevers, chills, night sweats, weight loss, swollen lymph nodes, body aches, joint swelling, chest pain, shortness of breath, mood changes. POSITIVE muscle aches  Objective  Blood pressure 110/78, pulse 93, height 5\' 6"  (1.676 m), SpO2 98 %.   General: No apparent distress alert and oriented x3 mood and affect normal, dressed appropriately.  HEENT: Pupils equal, extraocular movements intact  Respiratory: Patient's speak in full sentences and does not appear short  of breath  Cardiovascular: No lower extremity edema, non tender, no erythema  Neck exam does have some loss of lordosis noted.  Discussed icing exercises.  Increase activity slowly  Osteopathic findings  C3 flexed rotated and side bent right C7 flexed rotated and side bent left T4 extended rotated and side bent right inhaled rib T5 extended rotated and side bent right with inhaled. T7 extended rotated and side bent left L2 flexed rotated and side bent right Sacrum right on right       Assessment and Plan:  Poor posture Poor posture responds very well to osteopathic manipulation.  Discussed icing regimen and home exercises.  Has meloxicam for breakthrough pain.  Follow-up with me again in 2 to 3 months if necessary    Nonallopathic problems  Decision today to treat with OMT was based on Physical Exam  After verbal consent patient was treated with HVLA, ME, FPR techniques in cervical, rib, thoracic, lumbar, and sacral  areas  Patient tolerated the procedure well with improvement in symptoms  Patient given exercises, stretches and lifestyle modifications  See medications in patient instructions if given  Patient will follow up in 8-12 weeks    The above documentation has been reviewed and is accurate and complete Judi Saa, DO          Note: This dictation was prepared with Dragon dictation along with smaller phrase technology. Any transcriptional errors that result from this process are unintentional.

## 2022-05-30 ENCOUNTER — Ambulatory Visit: Payer: BC Managed Care – PPO | Admitting: Family Medicine

## 2022-05-30 VITALS — BP 110/78 | HR 93 | Ht 66.0 in

## 2022-05-30 DIAGNOSIS — M9904 Segmental and somatic dysfunction of sacral region: Secondary | ICD-10-CM | POA: Diagnosis not present

## 2022-05-30 DIAGNOSIS — R293 Abnormal posture: Secondary | ICD-10-CM | POA: Diagnosis not present

## 2022-05-30 DIAGNOSIS — M9903 Segmental and somatic dysfunction of lumbar region: Secondary | ICD-10-CM

## 2022-05-30 DIAGNOSIS — M9902 Segmental and somatic dysfunction of thoracic region: Secondary | ICD-10-CM

## 2022-05-30 DIAGNOSIS — M9908 Segmental and somatic dysfunction of rib cage: Secondary | ICD-10-CM

## 2022-05-30 DIAGNOSIS — M9901 Segmental and somatic dysfunction of cervical region: Secondary | ICD-10-CM | POA: Diagnosis not present

## 2022-05-30 NOTE — Assessment & Plan Note (Signed)
Poor posture responds very well to osteopathic manipulation.  Discussed icing regimen and home exercises.  Has meloxicam for breakthrough pain.  Follow-up with me again in 2 to 3 months if necessary

## 2022-06-03 NOTE — Progress Notes (Signed)
[facility] Counselor/Therapist Progress Note  Patient ID: Diana King, MRN: [id number],    Date: 06/03/2022  Time Spent: 55 min In Person @ [facility]  Treatment Type: Individual Therapy  Reported Symptoms: Elevated anx/dep & stress due to Husb coming home from a Guys Night Out talking about more children. This angered Pt bc she has already made it clear she does not want any more children.   Mental Status Exam: Appearance:  Casual     Behavior: Appropriate and Sharing  Motor: Normal  Speech/Language:  Clear and Coherent and Normal Rate  Affect: Appropriate  Mood: anxious  Thought process: goal directed  Thought content:   WNL  Sensory/Perceptual disturbances:   WNL  Orientation: oriented to person, place, and time/date  Attention: Good  Concentration: Good  Memory: WNL  Fund of knowledge:  Good  Insight:   Good  Judgment:  Good  Impulse Control: Good   Risk Assessment: Danger to Self:  No Self-injurious Behavior: No Danger to Others: No Duty to Warn:no Physical Aggression / Violence:No  Access to Firearms a concern: No  Gang Involvement:No   Subjective: Pt is upset over conversation w/Spouse last week after he went out w/friends drinking. He came home asking her about expanding their Family. This made her very angry, very anxious, some feelings of panic & very upset w/him. Her Dtr has also recently told her she wants a Sibling. She is unsure what he wants & knows she is working on herself, but he is not.  Husb Dylan recently conveyed to Pt he is feeling better than he did in late 2023 & early 2024. This was the first she heard of this concern for him. She was blindsided & wants him to see someone.  Recently, the Family conveyed to Pt of 2 deaths in the Family. Members she was close to & not told about deaths until now. Pt has spoken w/her Dad multiple times since the deaths & her feelings were deeply hurt. This has happened before & she  disappears & gets quiet.    Interventions: Attachment Theory approach w/TIC, Families, attachment, & emot'l wounding in childhood psychoedu  Diagnosis:Anxiety and depression  Plan: Jakylah will cont to work through her Attachment issues in psychotherapy so she can better understand her relationship w/in the [facility] & how she is highly impacted by injuries to herself emot'ly. She has solide boundaries, & will cont to share to her comfort level about her childhood. Buna is learning more about herself & how hurtful things can be when it involves bad news or loss. She will keep in mind the good news she has heard lately & incorporate this in more active ways to settle her CNS down when it gets activated by past trauma.  Target Date: 07/21/2022  Progress: 5  Frequency: Once every 3-4 wks  Modality: Claretta Fraise, LMFT

## 2022-06-03 NOTE — Progress Notes (Signed)
                Diana King Diana Kerrigan Glendening, LMFT 

## 2022-06-11 NOTE — Addendum Note (Signed)
Addended by: Deneise Lever on: 06/11/2022 05:25 PM   Modules accepted: Level of Service

## 2022-06-12 ENCOUNTER — Encounter: Payer: Self-pay | Admitting: Family Medicine

## 2022-06-12 ENCOUNTER — Other Ambulatory Visit: Payer: Self-pay

## 2022-06-12 ENCOUNTER — Ambulatory Visit: Payer: BC Managed Care – PPO | Admitting: Family Medicine

## 2022-06-12 VITALS — BP 108/78 | HR 76 | Ht 66.0 in

## 2022-06-12 DIAGNOSIS — M79671 Pain in right foot: Secondary | ICD-10-CM | POA: Diagnosis not present

## 2022-06-12 DIAGNOSIS — S93311A Subluxation of tarsal joint of right foot, initial encounter: Secondary | ICD-10-CM

## 2022-06-12 NOTE — Progress Notes (Signed)
[name] [address] [place] [address] Phone: [phone] Subjective:   Bruce Donath, am serving as a scribe for Dr. Antoine Primas.  I'm seeing this patient by the request  of:  Pincus Sanes, MD  CC: Right foot pain  [id number]  ILEY SCHIMPF is a 32 y.o. female coming in with complaint of R foot pain. Patient states she was riding in car with husband and she was PF foot for prolonged period. Unable to walk without limp immediately after getting out of car and for past few days. Pain located over medial longitudinal arch. The more she moves the better she feels.      Past Medical History:  Diagnosis Date   Acne    Anxiety    Back pain    Depression    GERD (gastroesophageal reflux disease)    IBS (irritable bowel syndrome)    Overweight    Pain in Achilles tendon    Palpitations    Past Surgical History:  Procedure Laterality Date   NO PAST SURGERIES     Social History   Socioeconomic History   Marital status: Married    Spouse name: Dustin   Number of children: 1   Years of education: Not on file   Highest education level: Not on file  Occupational History   Occupation: CMA  Tobacco Use   Smoking status: Never   Smokeless tobacco: Never  Substance and Sexual Activity   Alcohol use: Yes    Comment: social   Drug use: Not on file   Sexual activity: Not on file  Other Topics Concern   Not on file  Social History Narrative   Not on file   Social Determinants of Health   Financial Resource Strain: Not on file  Food Insecurity: Not on file  Transportation Needs: Not on file  Physical Activity: Not on file  Stress: Not on file  Social Connections: Not on file   Allergies  Allergen Reactions   Latex Swelling   Topamax [Topiramate] Other (See Comments)   Gabapentin    Family History  Problem Relation Age of Onset   Fibromyalgia Mother    Interstitial cystitis Mother    Migraines Mother     Depression Mother    Anxiety disorder Mother    Alcohol abuse Maternal Aunt    Mental illness Maternal Aunt    Alcohol abuse Maternal Uncle    Thyroid disease Maternal Grandmother    Hyperlipidemia Father    Alcohol abuse Father     Current Outpatient Medications (Endocrine & Metabolic):    drospirenone-ethinyl estradiol (YAZ,GIANVI,LORYNA) 3-0.02 MG tablet, Take 1 tablet by mouth daily.  Current Outpatient Medications (Cardiovascular):    spironolactone (ALDACTONE) 50 MG tablet, Take 50 mg by mouth daily.   Current Outpatient Medications (Analgesics):    meloxicam (MOBIC) 15 MG tablet, Take 1 tablet (15 mg total) by mouth daily.   Current Outpatient Medications (Other):    Adapalene 0.3 % gel, Apply 1 application topically at bedtime.   buPROPion (WELLBUTRIN XL) 300 MG 24 hr tablet, Take 1 tablet (300 mg total) by mouth daily.   busPIRone (BUSPAR) 5 MG tablet, TAKE 2 TABLETS BY MOUTH 2 TIMES DAILY.   escitalopram (LEXAPRO) 10 MG tablet, Take 1 tablet (10 mg total) by mouth daily.   naltrexone (DEPADE) 50 MG tablet, Take 1/2 tablet (25 mg total) by mouth daily.   tiZANidine (ZANAFLEX) 4 MG tablet, TAKE 1 TABLET BY  MOUTH EVERYDAY AT BEDTIME   Vitamin D, Ergocalciferol, (DRISDOL) 1.25 MG (50000 UNIT) CAPS capsule, Take 1 capsule (50,000 Units total) by mouth every 7 (seven) days.   Reviewed prior external information including notes and imaging from  primary care provider As well as notes that were available from care everywhere and other healthcare systems.  Past medical history, social, surgical and family history all reviewed in electronic medical record.  No pertanent information unless stated regarding to the chief complaint.   Review of Systems:  No headache, visual changes, nausea, vomiting, diarrhea, constipation, dizziness, abdominal pain, skin rash, fevers, chills, night sweats, weight loss, swollen lymph nodes, body aches, joint swelling, chest pain, shortness of  breath, mood changes. POSITIVE muscle aches  Objective  Blood pressure 108/78, pulse 76, height 5\' 6"  (1.676 m), SpO2 98 %.   General: No apparent distress alert and oriented x3 mood and affect normal, dressed appropriately.  HEENT: Pupils equal, extraocular movements intact  Respiratory: Patient's speak in full sentences and does not appear short of breath  Cardiovascular: No lower extremity edema, non tender, no erythema  Patient does have tenderness noted to the aspect of the cuboid bone.  This causes rigidity of the midfoot.  Does seem to be subluxed some.  Limited muscular skeletal ultrasound was performed and interpreted by Antoine Primas, M  Limited ultrasound shows that there is some mild subluxation of the cuboid bone inferiorly.  No significant hypoechoic changes noted at this time. Impression: dropped cuboid noted      Impression and Recommendations:    The above documentation has been reviewed and is accurate and complete Judi Saa, DO

## 2022-06-12 NOTE — Assessment & Plan Note (Signed)
After consent patient retained HVLA of the cuboid bone to allow for to be in place.  Discussed a rigid soled shoe would be more beneficial for her at this time.  Anti-inflammatories as needed.  Follow-up with me as needed.

## 2022-06-18 ENCOUNTER — Other Ambulatory Visit: Payer: Self-pay | Admitting: Internal Medicine

## 2022-06-18 ENCOUNTER — Other Ambulatory Visit: Payer: Self-pay | Admitting: Sports Medicine

## 2022-06-19 ENCOUNTER — Ambulatory Visit: Payer: BC Managed Care – PPO | Admitting: Behavioral Health

## 2022-06-19 DIAGNOSIS — F419 Anxiety disorder, unspecified: Secondary | ICD-10-CM

## 2022-06-19 DIAGNOSIS — F32A Depression, unspecified: Secondary | ICD-10-CM

## 2022-06-19 NOTE — Progress Notes (Unsigned)
                Diana King L [name], LMFT 

## 2022-06-20 NOTE — Progress Notes (Signed)
[facility] Counselor/Therapist Progress Note  Patient ID: Diana King, MRN: [id number],    Date: 06/20/2022  Time Spent: 55 min In Person @ [facility]   Treatment Type: Individual Therapy  Reported Symptoms: Elevated anx/dep & stress due to having prescription for Buspar 5mg  run out over the [facility].   Mental Status Exam: Appearance:  Casual     Behavior: Appropriate and Sharing  Motor: Normal  Speech/Language:  Clear and Coherent and Normal Rate  Affect: Appropriate  Mood: anxious  Thought process: normal  Thought content:   WNL  Sensory/Perceptual disturbances:   WNL  Orientation: oriented to person, place, and time/date  Attention: Good  Concentration: Good  Memory: WNL  Fund of knowledge:  Good  Insight:   Good  Judgment:  Good  Impulse Control: Good   Risk Assessment: Danger to Self:  No Self-injurious Behavior: No Danger to Others: No Duty to Warn:no Physical Aggression / Violence:No  Access to Firearms a concern: No  Gang Involvement:No   Subjective: Pt is upbeat today & apologetic for not having her medication on board today. Pt has been impatient w/her Mother lately & has decided her own pers boundaries are still healthy. She likes her wknds to herself, even though her Mother lives in [place].   Discussed Dtr Diana King briefly as Pt is processing her 32yo needs. Pt is comparing her own Parenting Style to that of her Mother & is glad she is doing things differently for her Dtr. Her own Mother was very reactive. Pt is making all efforts to inc her Dtr's trust, safety, & ability to talk w/her about anything on her mind.    Interventions: Insight-Oriented and Family Systems  Diagnosis:Anxiety and depression  Plan: Diana King will cont to take note of her Positive Parenting skills so she can change & evolve as her Dtr grows. She will notice Diana King's sensitivity to her on different occasions so we can further explore this in  session next visit.   Target Date: 07/21/2022  Progress: 5  Frequency: Once every 3-4 wks  Modality: Claretta Fraise, LMFT

## 2022-06-25 ENCOUNTER — Ambulatory Visit: Payer: BC Managed Care – PPO | Admitting: Family Medicine

## 2022-06-25 ENCOUNTER — Encounter: Payer: Self-pay | Admitting: Family Medicine

## 2022-06-25 ENCOUNTER — Ambulatory Visit: Payer: Self-pay

## 2022-06-25 VITALS — Ht 66.0 in

## 2022-06-25 DIAGNOSIS — M79671 Pain in right foot: Secondary | ICD-10-CM

## 2022-06-25 DIAGNOSIS — S93311A Subluxation of tarsal joint of right foot, initial encounter: Secondary | ICD-10-CM | POA: Diagnosis not present

## 2022-06-25 NOTE — Assessment & Plan Note (Signed)
On exam today do not see any significant changes noted at the moment.  Discussed potentially 5-day burst of anti-inflammatories.  Making sure she does wear good shoes.  Differential includes lumbar radiculopathy that we will need to continue to monitor.  May need to consider nerve conduction test if necessary.  Follow-up with me again on a more as-needed basis

## 2022-06-25 NOTE — Progress Notes (Signed)
[facility] [address] [place] [address] Phone: [phone] Subjective:    I'm seeing this patient by the request  of:  Pincus Sanes, MD  CC: Right foot pain follow-up  [id number]  Diana King is a 32 y.o. female coming in with complaint of right foot pain.  Started having worsening symptoms again.  Did have a drop cuboid and did need down manipulation from May 22.  Since then has had this intermittent discomfort and pain again.     Past Medical History:  Diagnosis Date   Acne    Anxiety    Back pain    Depression    GERD (gastroesophageal reflux disease)    IBS (irritable bowel syndrome)    Overweight    Pain in Achilles tendon    Palpitations    Past Surgical History:  Procedure Laterality Date   NO PAST SURGERIES     Social History   Socioeconomic History   Marital status: Married    Spouse name: Dustin   Number of children: 1   Years of education: Not on file   Highest education level: Not on file  Occupational History   Occupation: CMA  Tobacco Use   Smoking status: Never   Smokeless tobacco: Never  Substance and Sexual Activity   Alcohol use: Yes    Comment: social   Drug use: Not on file   Sexual activity: Not on file  Other Topics Concern   Not on file  Social History Narrative   Not on file   Social Determinants of Health   Financial Resource Strain: Not on file  Food Insecurity: Not on file  Transportation Needs: Not on file  Physical Activity: Not on file  Stress: Not on file  Social Connections: Not on file   Allergies  Allergen Reactions   Latex Swelling   Topamax [Topiramate] Other (See Comments)   Gabapentin    Family History  Problem Relation Age of Onset   Fibromyalgia Mother    Interstitial cystitis Mother    Migraines Mother    Depression Mother    Anxiety disorder Mother    Alcohol abuse Maternal Aunt    Mental illness Maternal Aunt    Alcohol abuse Maternal Uncle     Thyroid disease Maternal Grandmother    Hyperlipidemia Father    Alcohol abuse Father     Current Outpatient Medications (Endocrine & Metabolic):    drospirenone-ethinyl estradiol (YAZ,GIANVI,LORYNA) 3-0.02 MG tablet, Take 1 tablet by mouth daily.  Current Outpatient Medications (Cardiovascular):    spironolactone (ALDACTONE) 50 MG tablet, Take 50 mg by mouth daily.   Current Outpatient Medications (Analgesics):    meloxicam (MOBIC) 15 MG tablet, Take 1 tablet (15 mg total) by mouth daily.   Current Outpatient Medications (Other):    Adapalene 0.3 % gel, Apply 1 application topically at bedtime.   buPROPion (WELLBUTRIN XL) 300 MG 24 hr tablet, Take 1 tablet (300 mg total) by mouth daily.   busPIRone (BUSPAR) 5 MG tablet, TAKE 1 TABLET BY MOUTH TWICE A DAY   escitalopram (LEXAPRO) 10 MG tablet, Take 1 tablet (10 mg total) by mouth daily.   naltrexone (DEPADE) 50 MG tablet, Take 1/2 tablet (25 mg total) by mouth daily.   tiZANidine (ZANAFLEX) 4 MG tablet, TAKE 1 TABLET BY MOUTH EVERYDAY AT BEDTIME   Vitamin D, Ergocalciferol, (DRISDOL) 1.25 MG (50000 UNIT) CAPS capsule, Take 1 capsule (50,000 Units total) by mouth every 7 (seven) days.  Reviewed prior external information including notes and imaging from  primary care provider As well as notes that were available from care everywhere and other healthcare systems.  Past medical history, social, surgical and family history all reviewed in electronic medical record.  No pertanent information unless stated regarding to the chief complaint.   Review of Systems:  No headache, visual changes, nausea, vomiting, diarrhea, constipation, dizziness, abdominal pain, skin rash, fevers, chills, night sweats, weight loss, swollen lymph nodes, body aches, joint swelling, chest pain, shortness of breath, mood changes. POSITIVE muscle aches mostly of the foot  Objective  Height 5\' 6"  (1.676 m).   General: No apparent distress alert and oriented x3  mood and affect normal, dressed appropriately.  HEENT: Pupils equal, extraocular movements intact  Respiratory: Patient's speak in full sentences and does not appear short of breath  Cardiovascular: No lower extremity edema, non tender, no erythema  Right foot exam does have some swelling that appears on the plantar aspect of the foot.  Minorly tender.  No sign no other drop cuboid.  Can ambulate without any significant discomfort at the moment.    Impression and Recommendations:     The above documentation has been reviewed and is accurate and complete Judi Saa, DO

## 2022-06-25 NOTE — Progress Notes (Signed)
[name] [address] [place] [address] Phone: [phone] Subjective:   Bruce Donath, am serving as a scribe for Dr. Antoine Primas.  I'm seeing this patient by the request  of:  Burns, Bobette Mo, MD  CC:   [id number]  Diana King is a 32 y.o. female coming in with complaint of     Past Medical History:  Diagnosis Date   Acne    Anxiety    Back pain    Depression    GERD (gastroesophageal reflux disease)    IBS (irritable bowel syndrome)    Overweight    Pain in Achilles tendon    Palpitations    Past Surgical History:  Procedure Laterality Date   NO PAST SURGERIES     Social History   Socioeconomic History   Marital status: Married    Spouse name: Dustin   Number of children: 1   Years of education: Not on file   Highest education level: Not on file  Occupational History   Occupation: CMA  Tobacco Use   Smoking status: Never   Smokeless tobacco: Never  Substance and Sexual Activity   Alcohol use: Yes    Comment: social   Drug use: Not on file   Sexual activity: Not on file  Other Topics Concern   Not on file  Social History Narrative   Not on file   Social Determinants of Health   Financial Resource Strain: Not on file  Food Insecurity: Not on file  Transportation Needs: Not on file  Physical Activity: Not on file  Stress: Not on file  Social Connections: Not on file   Allergies  Allergen Reactions   Latex Swelling   Topamax [Topiramate] Other (See Comments)   Gabapentin    Family History  Problem Relation Age of Onset   Fibromyalgia Mother    Interstitial cystitis Mother    Migraines Mother    Depression Mother    Anxiety disorder Mother    Alcohol abuse Maternal Aunt    Mental illness Maternal Aunt    Alcohol abuse Maternal Uncle    Thyroid disease Maternal Grandmother    Hyperlipidemia Father    Alcohol abuse Father     Current Outpatient Medications (Endocrine & Metabolic):     drospirenone-ethinyl estradiol (YAZ,GIANVI,LORYNA) 3-0.02 MG tablet, Take 1 tablet by mouth daily.  Current Outpatient Medications (Cardiovascular):    spironolactone (ALDACTONE) 50 MG tablet, Take 50 mg by mouth daily.   Current Outpatient Medications (Analgesics):    meloxicam (MOBIC) 15 MG tablet, Take 1 tablet (15 mg total) by mouth daily.   Current Outpatient Medications (Other):    Adapalene 0.3 % gel, Apply 1 application topically at bedtime.   buPROPion (WELLBUTRIN XL) 300 MG 24 hr tablet, Take 1 tablet (300 mg total) by mouth daily.   busPIRone (BUSPAR) 5 MG tablet, TAKE 1 TABLET BY MOUTH TWICE A DAY   escitalopram (LEXAPRO) 10 MG tablet, Take 1 tablet (10 mg total) by mouth daily.   naltrexone (DEPADE) 50 MG tablet, Take 1/2 tablet (25 mg total) by mouth daily.   tiZANidine (ZANAFLEX) 4 MG tablet, TAKE 1 TABLET BY MOUTH EVERYDAY AT BEDTIME   Vitamin D, Ergocalciferol, (DRISDOL) 1.25 MG (50000 UNIT) CAPS capsule, Take 1 capsule (50,000 Units total) by mouth every 7 (seven) days.   Reviewed prior external information including notes and imaging from  primary care provider As well as notes that were available from care everywhere  and other healthcare systems.  Past medical history, social, surgical and family history all reviewed in electronic medical record.  No pertanent information unless stated regarding to the chief complaint.   Review of Systems:  No headache, visual changes, nausea, vomiting, diarrhea, constipation, dizziness, abdominal pain, skin rash, fevers, chills, night sweats, weight loss, swollen lymph nodes, body aches, joint swelling, chest pain, shortness of breath, mood changes. POSITIVE muscle aches  Objective  There were no vitals taken for this visit.   General: No apparent distress alert and oriented x3 mood and affect normal, dressed appropriately.  HEENT: Pupils equal, extraocular movements intact  Respiratory: Patient's speak in full sentences and  does not appear short of breath  Cardiovascular: No lower extremity edema, non tender, no erythema      Impression and Recommendations:

## 2022-07-03 DIAGNOSIS — Z01419 Encounter for gynecological examination (general) (routine) without abnormal findings: Secondary | ICD-10-CM | POA: Diagnosis not present

## 2022-07-03 DIAGNOSIS — Z6834 Body mass index (BMI) 34.0-34.9, adult: Secondary | ICD-10-CM | POA: Diagnosis not present

## 2022-07-03 DIAGNOSIS — Z30011 Encounter for initial prescription of contraceptive pills: Secondary | ICD-10-CM | POA: Diagnosis not present

## 2022-07-05 NOTE — Progress Notes (Unsigned)
  [name] [address] [place] [address] Phone: [phone] Subjective:   Bruce Donath, am serving as a scribe for Dr. Antoine Primas.  I'm seeing this patient by the request  of:  Pincus Sanes, MD  CC: back and neck pain follow up   [id number]  Diana King is a 32 y.o. female coming in with complaint of back and neck pain. OMT 05/30/2022. Patient states that her lower back and neck are tight.  Patient was putting up a gazebo at home         Reviewed prior external information including notes and imaging from previsou exam, outside providers and external EMR if available.   As well as notes that were available from care everywhere and other healthcare systems.  Past medical history, social, surgical and family history all reviewed in electronic medical record.  No pertanent information unless stated regarding to the chief complaint.   Past Medical History:  Diagnosis Date   Acne    Anxiety    Back pain    Depression    GERD (gastroesophageal reflux disease)    IBS (irritable bowel syndrome)    Overweight    Pain in Achilles tendon    Palpitations     Allergies  Allergen Reactions   Latex Swelling   Topamax [Topiramate] Other (See Comments)   Gabapentin      R  Objective  Blood pressure 112/78, pulse 84, height 5\' 6"  (1.676 m), weight 206 lb (93.4 kg), SpO2 98 %.   General: No apparent distress alert and oriented x3 mood and affect normal, dressed appropriately.  HEENT: Pupils equal, extraocular movements intact  Respiratory: Patient's speak in full sentences and does not appear short of breath  Cardiovascular: No lower extremity edema, non tender, no erythema  Low back exam does have some mild loss lordosis but significant tightness in the trapezius is bilaterally.  Osteopathic findings  C6 flexed rotated and side bent left C7 flexed rotated and side bent right T3 extended rotated and side bent right  inhaled rib T4 extended rotated and side bent left with inhaled T7 extended rotated and side bent left L5 flexed rotated and side bent left Sacrum right on right       Assessment and Plan:  Low back pain Intermittent and more tightness seems to be actually of the poor posture and the over the head activity.  Discussed with patient about her husband owing  her for such a great present Encouraged her to do more stretches She does anything like this overhead.  Increase activity slowly otherwise.  Follow-up with me again in 6 to 8 weeks.    Nonallopathic problems  Decision today to treat with OMT was based on Physical Exam  After verbal consent patient was treated with HVLA, ME, FPR techniques in cervical, rib, thoracic, lumbar, and sacral  areas  Patient tolerated the procedure well with improvement in symptoms  Patient given exercises, stretches and lifestyle modifications  See medications in patient instructions if given  Patient will follow up in 4-8 weeks    The above documentation has been reviewed and is accurate and complete Judi Saa, DO          Note: This dictation was prepared with Dragon dictation along with smaller phrase technology. Any transcriptional errors that result from this process are unintentional.

## 2022-07-08 ENCOUNTER — Encounter: Payer: Self-pay | Admitting: Family Medicine

## 2022-07-08 ENCOUNTER — Ambulatory Visit: Payer: BC Managed Care – PPO | Admitting: Family Medicine

## 2022-07-08 VITALS — BP 112/78 | HR 84 | Ht 66.0 in | Wt 206.0 lb

## 2022-07-08 DIAGNOSIS — M9903 Segmental and somatic dysfunction of lumbar region: Secondary | ICD-10-CM

## 2022-07-08 DIAGNOSIS — M9902 Segmental and somatic dysfunction of thoracic region: Secondary | ICD-10-CM | POA: Diagnosis not present

## 2022-07-08 DIAGNOSIS — M545 Low back pain, unspecified: Secondary | ICD-10-CM | POA: Diagnosis not present

## 2022-07-08 DIAGNOSIS — M9904 Segmental and somatic dysfunction of sacral region: Secondary | ICD-10-CM

## 2022-07-08 DIAGNOSIS — M9901 Segmental and somatic dysfunction of cervical region: Secondary | ICD-10-CM | POA: Diagnosis not present

## 2022-07-08 DIAGNOSIS — M9908 Segmental and somatic dysfunction of rib cage: Secondary | ICD-10-CM

## 2022-07-08 NOTE — Assessment & Plan Note (Signed)
Intermittent and more tightness seems to be actually of the poor posture and the over the head activity.  Discussed with patient about her husband owing  her for such a great present Encouraged her to do more stretches She does anything like this overhead.  Increase activity slowly otherwise.  Follow-up with me again in 6 to 8 weeks.

## 2022-07-10 ENCOUNTER — Encounter: Payer: Self-pay | Admitting: Internal Medicine

## 2022-07-10 NOTE — Progress Notes (Unsigned)
      Subjective:    Patient ID: Diana King, female    DOB: 08/17/1990, 32 y.o.   MRN: [id number]     HPI Diana King is here for follow up of her chronic medical problems.  Here to discuss overall health and weight.    Medications and allergies reviewed with patient and updated if appropriate.  Current Outpatient Medications on File Prior to Visit  Medication Sig Dispense Refill   Adapalene 0.3 % gel Apply 1 application topically at bedtime. 45 g 5   buPROPion (WELLBUTRIN XL) 300 MG 24 hr tablet Take 1 tablet (300 mg total) by mouth daily. 90 tablet 3   busPIRone (BUSPAR) 5 MG tablet TAKE 1 TABLET BY MOUTH TWICE A DAY 180 tablet 3   drospirenone-ethinyl estradiol (YAZ,GIANVI,LORYNA) 3-0.02 MG tablet Take 1 tablet by mouth daily.     escitalopram (LEXAPRO) 10 MG tablet Take 1 tablet (10 mg total) by mouth daily. 90 tablet 1   meloxicam (MOBIC) 15 MG tablet Take 1 tablet (15 mg total) by mouth daily. 30 tablet 0   naltrexone (DEPADE) 50 MG tablet Take 1/2 tablet (25 mg total) by mouth daily. 30 tablet 0   spironolactone (ALDACTONE) 50 MG tablet Take 50 mg by mouth daily.     tiZANidine (ZANAFLEX) 4 MG tablet TAKE 1 TABLET BY MOUTH EVERYDAY AT BEDTIME 90 tablet 1   Vitamin D, Ergocalciferol, (DRISDOL) 1.25 MG (50000 UNIT) CAPS capsule Take 1 capsule (50,000 Units total) by mouth every 7 (seven) days. 12 capsule 0   No current facility-administered medications on file prior to visit.     Review of Systems     Objective:  There were no vitals filed for this visit. BP Readings from Last 3 Encounters:  07/08/22 112/78  06/12/22 108/78  05/30/22 110/78   Wt Readings from Last 3 Encounters:  07/08/22 206 lb (93.4 kg)  04/11/22 189 lb (85.7 kg)  04/10/22 190 lb (86.2 kg)   There is no height or weight on file to calculate BMI.    Physical Exam     Lab Results  Component Value Date   WBC 8.1 04/03/2022   HGB 14.6 04/03/2022   HCT 44.4 04/03/2022   PLT 421.0 (H)  04/03/2022   GLUCOSE 92 04/03/2022   CHOL 230 (H) 01/31/2022   TRIG 84 01/31/2022   HDL 66 01/31/2022   LDLCALC 149 (H) 01/31/2022   ALT 13 04/03/2022   AST 17 04/03/2022   NA 135 04/03/2022   K 4.5 04/03/2022   CL 101 04/03/2022   CREATININE 0.98 04/03/2022   BUN 19 04/03/2022   CO2 24 04/03/2022   TSH 2.42 04/03/2022   HGBA1C 5.7 (H) 01/31/2022     Assessment & Plan:    See Problem List for Assessment and Plan of chronic medical problems.

## 2022-07-11 ENCOUNTER — Other Ambulatory Visit (HOSPITAL_COMMUNITY): Payer: Self-pay

## 2022-07-11 ENCOUNTER — Ambulatory Visit: Payer: BC Managed Care – PPO | Admitting: Internal Medicine

## 2022-07-11 ENCOUNTER — Ambulatory Visit: Payer: BC Managed Care – PPO | Admitting: Sports Medicine

## 2022-07-11 VITALS — BP 110/70 | HR 77 | Temp 97.6°F | Wt 206.0 lb

## 2022-07-11 VITALS — BP 110/70 | HR 77 | Ht 66.0 in | Wt 206.0 lb

## 2022-07-11 DIAGNOSIS — F32A Depression, unspecified: Secondary | ICD-10-CM | POA: Diagnosis not present

## 2022-07-11 DIAGNOSIS — E669 Obesity, unspecified: Secondary | ICD-10-CM

## 2022-07-11 DIAGNOSIS — G8929 Other chronic pain: Secondary | ICD-10-CM | POA: Diagnosis not present

## 2022-07-11 DIAGNOSIS — Z6830 Body mass index (BMI) 30.0-30.9, adult: Secondary | ICD-10-CM

## 2022-07-11 DIAGNOSIS — M545 Low back pain, unspecified: Secondary | ICD-10-CM | POA: Diagnosis not present

## 2022-07-11 DIAGNOSIS — F419 Anxiety disorder, unspecified: Secondary | ICD-10-CM | POA: Diagnosis not present

## 2022-07-11 MED ORDER — KETOROLAC TROMETHAMINE 60 MG/2ML IM SOLN
60.0000 mg | Freq: Once | INTRAMUSCULAR | Status: AC
  Administered 2022-07-11: 60 mg via INTRAMUSCULAR

## 2022-07-11 MED ORDER — TIRZEPATIDE-WEIGHT MANAGEMENT 2.5 MG/0.5ML ~~LOC~~ SOAJ
2.5000 mg | SUBCUTANEOUS | 0 refills | Status: DC
  Filled 2022-07-11: qty 2, 28d supply, fill #0

## 2022-07-11 MED ORDER — METHYLPREDNISOLONE ACETATE 80 MG/ML IJ SUSP
80.0000 mg | Freq: Once | INTRAMUSCULAR | Status: AC
  Administered 2022-07-11: 80 mg via INTRAMUSCULAR

## 2022-07-11 NOTE — Assessment & Plan Note (Signed)
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

## 2022-07-11 NOTE — Assessment & Plan Note (Signed)
Chronic Controlled  Continue Lexapro 10 mg daily Continue bupropion XL 300 mg daily Continue buspirone 10 mg twice daily as needed

## 2022-07-11 NOTE — Progress Notes (Signed)
Diana King [name] [address] [place] [address] Phone: [phone]   Assessment and Plan:     1. Chronic midline low back pain without sciatica -Chronic with exacerbation, subsequent visit - Patient presents with acutely worsening low back pain.  Patient was experiencing a mild flare of back pain that generally improved after OMT on 07/08/2022, however patient woke up the following day on 07/09/2022 with moderate flare of low back pain and low back spasms without radicular symptoms - No red flag signs so no imaging at today's visit - Due to patient recurrent flare and general tenderness, elected to not repeat OMT at today's visit - Patient elected for IM injection of methylprednisone 80 mg/Toradol 60 mg.  Injection given in clinic today and tolerated well. - methylPREDNISolone acetate (DEPO-MEDROL) injection 80 mg - ketorolac (TORADOL) injection 60 mg    Pertinent previous records reviewed include none   Follow Up: As needed   Subjective:   I, Diana King, am serving as a Neurosurgeon for Diana King  Chief Complaint: back pain   HPI:  07/08/2022 Diana King is a 32 y.o. female coming in with complaint of back and neck pain. OMT 05/30/2022. Patient states that her lower back and neck are tight.  Patient was putting up a gazebo at home  Reviewed prior external information including notes and imaging from previsou exam, outside providers and external EMR if available.   07/11/22 Patient states she is having mid back pain   Relevant Historical Information: None pertinent  Additional pertinent review of systems negative.   Current Outpatient Medications:    Adapalene 0.3 % gel, Apply 1 application topically at bedtime., Disp: 45 g, Rfl: 5   buPROPion (WELLBUTRIN XL) 300 MG 24 hr tablet, Take 1 tablet (300 mg total) by mouth daily., Disp: 90 tablet, Rfl: 3   busPIRone (BUSPAR) 5 MG tablet, TAKE 1 TABLET BY MOUTH  TWICE A DAY, Disp: 180 tablet, Rfl: 3   drospirenone-ethinyl estradiol (YAZ,GIANVI,LORYNA) 3-0.02 MG tablet, Take 1 tablet by mouth daily., Disp: , Rfl:    escitalopram (LEXAPRO) 10 MG tablet, Take 1 tablet (10 mg total) by mouth daily., Disp: 90 tablet, Rfl: 1   meloxicam (MOBIC) 15 MG tablet, Take 1 tablet (15 mg total) by mouth daily., Disp: 30 tablet, Rfl: 0   spironolactone (ALDACTONE) 50 MG tablet, Take 50 mg by mouth daily., Disp: , Rfl:    tirzepatide (ZEPBOUND) 2.5 MG/0.5ML Pen, Inject 2.5 mg into the skin once a week., Disp: 2.5 mL, Rfl: 0   tiZANidine (ZANAFLEX) 4 MG tablet, TAKE 1 TABLET BY MOUTH EVERYDAY AT BEDTIME, Disp: 90 tablet, Rfl: 1   Vitamin D, Ergocalciferol, (DRISDOL) 1.25 MG (50000 UNIT) CAPS capsule, Take 1 capsule (50,000 Units total) by mouth every 7 (seven) days., Disp: 12 capsule, Rfl: 0   Objective:     Vitals:   07/11/22 0930  BP: 110/70  Pulse: 77  SpO2: 97%  Weight: 206 lb (93.4 kg)  Height: 5\' 6"  (1.676 m)      Body mass index is 33.25 kg/m.    Physical Exam:    Gen: Appears well, nad, nontoxic and pleasant Psych: Alert and oriented, appropriate mood and affect Neuro: sensation intact, strength is 5/5 in upper and lower extremities, muscle tone wnl Skin: no susupicious lesions or rashes  Back - Normal skin, Spine with normal alignment and no deformity.   No tenderness to vertebral process palpation.   Bilateral  lumbar paraspinous muscles are   tender and with spasm NTTP gluteal musculature Gait antalgic with short steps to decrease low back pain  Electronically signed by:  Diana [name] 9:52 AM 07/11/22

## 2022-07-11 NOTE — Patient Instructions (Addendum)
       Medications changes include :   zepbound     Return in about 6 months (around 01/10/2023) for Physical Exam.

## 2022-07-16 ENCOUNTER — Ambulatory Visit: Payer: BC Managed Care – PPO | Admitting: Family Medicine

## 2022-07-18 ENCOUNTER — Other Ambulatory Visit (HOSPITAL_COMMUNITY): Payer: Self-pay

## 2022-07-18 ENCOUNTER — Telehealth: Payer: Self-pay | Admitting: Radiology

## 2022-07-18 ENCOUNTER — Ambulatory Visit: Payer: BC Managed Care – PPO | Admitting: Behavioral Health

## 2022-07-18 ENCOUNTER — Telehealth: Payer: Self-pay

## 2022-07-18 DIAGNOSIS — F419 Anxiety disorder, unspecified: Secondary | ICD-10-CM | POA: Diagnosis not present

## 2022-07-18 DIAGNOSIS — F32A Depression, unspecified: Secondary | ICD-10-CM | POA: Diagnosis not present

## 2022-07-18 NOTE — Progress Notes (Signed)
                Diana King L Nadim Malia, LMFT 

## 2022-07-18 NOTE — Telephone Encounter (Signed)
Pharmacy Patient Advocate Encounter   Received notification that prior authorization for Zepbound 2.5mg /0.19ml is required/requested.   PA submitted to [employer] via CoverMyMeds Key  # [id number]  Status is Drug not covered by plan. This medication may be excluded from the patient's benefit.

## 2022-07-18 NOTE — Telephone Encounter (Signed)
PA teaming seeing if zepbound is covered.

## 2022-07-23 ENCOUNTER — Encounter: Payer: Self-pay | Admitting: Internal Medicine

## 2022-07-23 MED ORDER — LISDEXAMFETAMINE DIMESYLATE 30 MG PO CAPS: 30.0000 mg | ORAL_CAPSULE | Freq: Every day | ORAL | 0 refills | Status: DC

## 2022-07-23 NOTE — Telephone Encounter (Signed)
Medication not covered under plan.  Patient informed and sent my-chart message for different script to be sent.

## 2022-07-24 NOTE — Progress Notes (Signed)
[facility] Counselor/Therapist Progress Note  Patient ID: Diana King, MRN: [id number],    Date: 07/24/2022  Time Spent: 55 min In Person @ [facility] Office   Treatment Type: Individual Therapy  Reported Symptoms: Elevated anx/dep due to work stressors & the multi-tasking she performs daily w/minimal sense of Staff respect or understanding of her job.  Mental Status Exam: Appearance:  Casual     Behavior: Appropriate and Sharing  Motor: Normal  Speech/Language:  Clear and Coherent  Affect: Appropriate  Mood: anxious  Thought process: normal  Thought content:   WNL  Sensory/Perceptual disturbances:   WNL  Orientation: oriented to person, place, time/date, and situation  Attention: Good  Concentration: Good  Memory: WNL  Fund of knowledge:  Good  Insight:   Good  Judgment:  Good  Impulse Control: Good   Risk Assessment: Danger to Self:  No Self-injurious Behavior: No Danger to Others: No Duty to Warn:no Physical Aggression / Violence:No  Access to Firearms a concern: No  Gang Involvement:No   Subjective: Pt is upset due to the excess inc in her duties @ work. She is very versatile & can do many tasks in her Office. This causes her to be pulled daily into different positions in the Practice. This has become frustrating.  Pt is concerned for her weight. Her Ins no longer covers the GLP-I's & she was doing well when using this medication. She feels, "imprisoned by food" & "constantly thinking about it". She does not exp the psychological cues for fullness or the Px feelings of "enough". Her eating issues have always been a problem since she was a Youth. More recently, it has been activated since the birth of her 34yo Dtr Insurance risk surveyor.   Interventions: Solution-Oriented/Positive Psychology, Ego-Supportive, and Psycho-education/Bibliotherapy  Diagnosis:Anxiety and depression  Plan: Pt will f/u on Referral to Dr. Wyona Almas, PhD, RD per Clinician suggestion in  the next few wks. She will maintain her Notebook records to further track her emotions as related to food.  Target Date: 09/05/2022  Progress: 4  Frequency: Once every 3-4 wks  Modality: Claretta Fraise, LMFT

## 2022-08-05 ENCOUNTER — Ambulatory Visit: Payer: BC Managed Care – PPO | Admitting: Behavioral Health

## 2022-08-18 ENCOUNTER — Other Ambulatory Visit: Payer: Self-pay | Admitting: Internal Medicine

## 2022-08-19 MED ORDER — LISDEXAMFETAMINE DIMESYLATE 30 MG PO CAPS
30.0000 mg | ORAL_CAPSULE | Freq: Every day | ORAL | 0 refills | Status: DC
Start: 2022-08-19 — End: 2022-08-29

## 2022-08-21 ENCOUNTER — Encounter (INDEPENDENT_AMBULATORY_CARE_PROVIDER_SITE_OTHER): Payer: Self-pay

## 2022-08-26 ENCOUNTER — Ambulatory Visit: Payer: BC Managed Care – PPO | Admitting: Internal Medicine

## 2022-08-28 ENCOUNTER — Encounter: Payer: Self-pay | Admitting: Internal Medicine

## 2022-08-28 ENCOUNTER — Other Ambulatory Visit: Payer: Self-pay

## 2022-08-28 NOTE — Progress Notes (Unsigned)
[name] [address] [place] [address] Phone: [phone] Subjective:    I'm seeing this patient by the request  of:  Pincus Sanes, MD  CC: Right elbow pain  [id number]  Diana King is a 32 y.o. female coming in with complaint of elbow pain. Patient states pain has been going on for about 3 1/2 weeks. pain located lateral elbow and is a sore feeling. Quick sharp pain when trying to full extend pick something up or pulling open a door. Have tried exercises for tennis elbow which helped a lot, ice, and meloxicam. It was doing fine until patient tried to pull in chair while sitting and felt a painful twinge. since incident the exercises, ice and ibuprofen have not helped.       Past Medical History:  Diagnosis Date   Acne    Anxiety    Back pain    Depression    GERD (gastroesophageal reflux disease)    IBS (irritable bowel syndrome)    Overweight    Pain in Achilles tendon    Palpitations    Past Surgical History:  Procedure Laterality Date   NO PAST SURGERIES     Social History   Socioeconomic History   Marital status: Married    Spouse name: Dustin   Number of children: 1   Years of education: Not on file   Highest education level: Associate degree: academic program  Occupational History   Occupation: CMA  Tobacco Use   Smoking status: Never   Smokeless tobacco: Never  Substance and Sexual Activity   Alcohol use: Yes    Comment: social   Drug use: Not on file   Sexual activity: Not on file  Other Topics Concern   Not on file  Social History Narrative   Not on file   Social Determinants of Health   Financial Resource Strain: Low Risk  (07/11/2022)   Overall Financial Resource Strain (CARDIA)    Difficulty of Paying Living Expenses: Not hard at all  Food Insecurity: No Food Insecurity (07/11/2022)   Hunger Vital Sign    Worried About Running Out of Food in the Last Year: Never true    Ran Out of Food in  the Last Year: Never true  Transportation Needs: No Transportation Needs (07/11/2022)   PRAPARE - Administrator, Civil Service (Medical): No    Lack of Transportation (Non-Medical): No  Physical Activity: Insufficiently Active (07/11/2022)   Exercise Vital Sign    Days of Exercise per Week: 3 days    Minutes of Exercise per Session: 20 min  Stress: Stress Concern Present (07/11/2022)   [employer] of Occupational Health - Occupational Stress Questionnaire    Feeling of Stress : To some extent  Social Connections: Unknown (07/11/2022)   Social Connection and Isolation Panel [NHANES]    Frequency of Communication with Friends and Family: Patient declined    Frequency of Social Gatherings with Friends and Family: Patient declined    Attends Religious Services: 1 to 4 times per year    Active Member of [employer] or Organizations: Yes    Attends Banker Meetings: 1 to 4 times per year    Marital Status: Married   Allergies  Allergen Reactions   Latex Swelling   Topamax [Topiramate] Other (See Comments)   Gabapentin    Family History  Problem Relation Age of Onset   Fibromyalgia Mother    Interstitial cystitis Mother  Migraines Mother    Depression Mother    Anxiety disorder Mother    Alcohol abuse Maternal Aunt    Mental illness Maternal Aunt    Alcohol abuse Maternal Uncle    Thyroid disease Maternal Grandmother    Hyperlipidemia Father    Alcohol abuse Father     Current Outpatient Medications (Endocrine & Metabolic):    drospirenone-ethinyl estradiol (YAZ,GIANVI,LORYNA) 3-0.02 MG tablet, Take 1 tablet by mouth daily.  Current Outpatient Medications (Cardiovascular):    spironolactone (ALDACTONE) 50 MG tablet, Take 50 mg by mouth daily.   Current Outpatient Medications (Analgesics):    meloxicam (MOBIC) 15 MG tablet, Take 1 tablet (15 mg total) by mouth daily.   Current Outpatient Medications (Other):    Adapalene 0.3 % gel, Apply 1  application topically at bedtime.   buPROPion (WELLBUTRIN XL) 300 MG 24 hr tablet, Take 1 tablet (300 mg total) by mouth daily.   busPIRone (BUSPAR) 5 MG tablet, TAKE 1 TABLET BY MOUTH TWICE A DAY   escitalopram (LEXAPRO) 10 MG tablet, Take 1 tablet (10 mg total) by mouth daily.   lisdexamfetamine (VYVANSE) 30 MG capsule, Take 1 capsule (30 mg total) by mouth daily.   tiZANidine (ZANAFLEX) 4 MG tablet, TAKE 1 TABLET BY MOUTH EVERYDAY AT BEDTIME   Vitamin D, Ergocalciferol, (DRISDOL) 1.25 MG (50000 UNIT) CAPS capsule, Take 1 capsule (50,000 Units total) by mouth every 7 (seven) days.   Reviewed prior external information including notes and imaging from  primary care provider As well as notes that were available from care everywhere and other healthcare systems.  Past medical history, social, surgical and family history all reviewed in electronic medical record.  No pertanent information unless stated regarding to the chief complaint.   Review of Systems:  No headache, visual changes, nausea, vomiting, diarrhea, constipation, dizziness, abdominal pain, skin rash, fevers, chills, night sweats, weight loss, swollen lymph nodes, body aches, joint swelling, chest pain, shortness of breath, mood changes. POSITIVE muscle aches  Objective  Pulse 83, height 5\' 6"  (1.676 m), weight 210 lb (95.3 kg), SpO2 99%.   General: No apparent distress alert and oriented x3 mood and affect normal, dressed appropriately.  HEENT: Pupils equal, extraocular movements intact  Respiratory: Patient's speak in full sentences and does not appear short of breath  Cardiovascular: No lower extremity edema, non tender, no erythema  Right elbow does have some tenderness to palpation noted still over the lateral epicondylar area.  Patient does have pain with resisted extension of the hand.  Limited muscular skeletal ultrasound was performed and interpreted by Antoine Primas, M   Lateral epicondylitis and does have what  appears to be tearing noted of the common extensor tendon that was more scar tissue related and seems to have a new tearing in the area.  No significant retraction noted. Impression: Scar tissue tearing of the lateral epicondylitis   Impression and Recommendations:    The above documentation has been reviewed and is accurate and complete Judi Saa, DO

## 2022-08-29 ENCOUNTER — Telehealth: Payer: Self-pay

## 2022-08-29 ENCOUNTER — Other Ambulatory Visit: Payer: Self-pay

## 2022-08-29 ENCOUNTER — Ambulatory Visit: Payer: BC Managed Care – PPO | Admitting: Family Medicine

## 2022-08-29 ENCOUNTER — Encounter: Payer: Self-pay | Admitting: Family Medicine

## 2022-08-29 VITALS — HR 83 | Ht 66.0 in | Wt 210.0 lb

## 2022-08-29 DIAGNOSIS — M7711 Lateral epicondylitis, right elbow: Secondary | ICD-10-CM

## 2022-08-29 DIAGNOSIS — M25521 Pain in right elbow: Secondary | ICD-10-CM

## 2022-08-29 MED ORDER — LISDEXAMFETAMINE DIMESYLATE 30 MG PO CAPS
30.0000 mg | ORAL_CAPSULE | Freq: Every day | ORAL | 0 refills | Status: DC
Start: 2022-08-29 — End: 2022-09-24

## 2022-08-29 NOTE — Telephone Encounter (Signed)
error 

## 2022-08-30 ENCOUNTER — Ambulatory Visit: Payer: BC Managed Care – PPO | Admitting: Family Medicine

## 2022-09-03 ENCOUNTER — Ambulatory Visit: Payer: BC Managed Care – PPO | Admitting: Behavioral Health

## 2022-09-03 DIAGNOSIS — F5081 Binge eating disorder: Secondary | ICD-10-CM

## 2022-09-03 DIAGNOSIS — F32A Depression, unspecified: Secondary | ICD-10-CM

## 2022-09-03 DIAGNOSIS — F419 Anxiety disorder, unspecified: Secondary | ICD-10-CM | POA: Diagnosis not present

## 2022-09-03 NOTE — Progress Notes (Signed)
                Victoria L Winstead, LMFT 

## 2022-09-05 ENCOUNTER — Encounter: Payer: Self-pay | Admitting: Internal Medicine

## 2022-09-05 DIAGNOSIS — F5081 Binge eating disorder: Secondary | ICD-10-CM | POA: Insufficient documentation

## 2022-09-05 NOTE — Patient Instructions (Addendum)
      Medications changes include :   none      Return in about 6 months (around 03/09/2023) for follow up.

## 2022-09-05 NOTE — Progress Notes (Signed)
Subjective:    Patient ID: Diana King, female    DOB: [birth year], 32 y.o.   MRN: [id number]     HPI Diana King is here for follow up of her chronic medical problems.  Started vyvanse the beginning of July - no side effects.    Medications and allergies reviewed with patient and updated if appropriate.  Current Outpatient Medications on File Prior to Visit  Medication Sig Dispense Refill   Adapalene 0.3 % gel Apply 1 application topically at bedtime. 45 g 5   buPROPion (WELLBUTRIN XL) 300 MG 24 hr tablet Take 1 tablet (300 mg total) by mouth daily. 90 tablet 3   busPIRone (BUSPAR) 5 MG tablet TAKE 1 TABLET BY MOUTH TWICE A DAY 180 tablet 3   drospirenone-ethinyl estradiol (YAZ,GIANVI,LORYNA) 3-0.02 MG tablet Take 1 tablet by mouth daily.     escitalopram (LEXAPRO) 10 MG tablet Take 1 tablet (10 mg total) by mouth daily. 90 tablet 1   lisdexamfetamine (VYVANSE) 30 MG capsule Take 1 capsule (30 mg total) by mouth daily. 30 capsule 0   meloxicam (MOBIC) 15 MG tablet Take 1 tablet (15 mg total) by mouth daily. 30 tablet 0   spironolactone (ALDACTONE) 50 MG tablet Take 50 mg by mouth daily.     tiZANidine (ZANAFLEX) 4 MG tablet TAKE 1 TABLET BY MOUTH EVERYDAY AT BEDTIME 90 tablet 1   Vitamin D, Ergocalciferol, (DRISDOL) 1.25 MG (50000 UNIT) CAPS capsule Take 1 capsule (50,000 Units total) by mouth every 7 (seven) days. 12 capsule 0   No current facility-administered medications on file prior to visit.     Review of Systems  Cardiovascular:  Negative for palpitations.  Neurological:  Positive for headaches (when she first went on). Negative for light-headedness.       Objective:   Vitals:   09/06/22 0749  BP: 114/72  Pulse: 83  Temp: 98.1 F (36.7 C)  SpO2: 100%   BP Readings from Last 3 Encounters:  09/06/22 114/72  07/11/22 110/70  07/11/22 110/70   Wt Readings from Last 3 Encounters:  09/06/22 210 lb (95.3 kg)  08/29/22 210 lb (95.3 kg)  07/11/22 206 lb  (93.4 kg)   Body mass index is 33.89 kg/m.    Physical Exam Constitutional:      General: She is not in acute distress.    Appearance: Normal appearance. She is not ill-appearing.  HENT:     Head: Normocephalic and atraumatic.  Skin:    General: Skin is warm and dry.  Neurological:     Mental Status: She is alert. Mental status is at baseline.  Psychiatric:        Mood and Affect: Mood normal.        Behavior: Behavior normal.        Thought Content: Thought content normal.        Judgment: Judgment normal.        Lab Results  Component Value Date   WBC 8.1 04/03/2022   HGB 14.6 04/03/2022   HCT 44.4 04/03/2022   PLT 421.0 (H) 04/03/2022   GLUCOSE 92 04/03/2022   CHOL 230 (H) 01/31/2022   TRIG 84 01/31/2022   HDL 66 01/31/2022   LDLCALC 149 (H) 01/31/2022   ALT 13 04/03/2022   AST 17 04/03/2022   NA 135 04/03/2022   K 4.5 04/03/2022   CL 101 04/03/2022   CREATININE 0.98 04/03/2022   BUN 19 04/03/2022   CO2 24 04/03/2022  TSH 2.42 04/03/2022   HGBA1C 5.7 (H) 01/31/2022     Assessment & Plan:    See Problem List for Assessment and Plan of chronic medical problems.

## 2022-09-06 ENCOUNTER — Ambulatory Visit: Payer: BC Managed Care – PPO | Admitting: Internal Medicine

## 2022-09-06 VITALS — BP 114/72 | HR 83 | Temp 98.1°F | Ht 66.0 in | Wt 210.0 lb

## 2022-09-06 DIAGNOSIS — F5081 Binge eating disorder: Secondary | ICD-10-CM

## 2022-09-06 DIAGNOSIS — E669 Obesity, unspecified: Secondary | ICD-10-CM

## 2022-09-06 NOTE — Assessment & Plan Note (Signed)
Chronic Vyvanse has helped with sugar craving and binging No side effects Continue vyvanse 30 mg - with next refill will increase to 40 mg daily Continue regular exercise

## 2022-09-06 NOTE — Assessment & Plan Note (Signed)
Chronic Doing well with vyvanse - has noticed decreased sugar craving and eating smaller portions Will continue vyvanse 30 mg daily Will increase vyvanse to 40 mg with next refill

## 2022-09-09 ENCOUNTER — Ambulatory Visit: Payer: BC Managed Care – PPO | Admitting: Internal Medicine

## 2022-09-16 ENCOUNTER — Ambulatory Visit: Payer: BC Managed Care – PPO | Admitting: Internal Medicine

## 2022-09-19 ENCOUNTER — Ambulatory Visit: Payer: BC Managed Care – PPO | Admitting: Sports Medicine

## 2022-09-19 VITALS — BP 132/80 | HR 81 | Ht 66.0 in | Wt 210.0 lb

## 2022-09-19 DIAGNOSIS — M25562 Pain in left knee: Secondary | ICD-10-CM

## 2022-09-19 DIAGNOSIS — E8889 Other specified metabolic disorders: Secondary | ICD-10-CM | POA: Diagnosis not present

## 2022-09-19 MED ORDER — MELOXICAM 15 MG PO TABS: 15.0000 mg | ORAL_TABLET | Freq: Every day | ORAL | 0 refills | Status: DC

## 2022-09-19 NOTE — Progress Notes (Signed)
[facility] Counselor/Therapist Progress Note  Patient ID: Diana King, MRN: [id number],    Date: 09/19/2022  Time Spent: 55 min In Person @ [facility] Time In: 4:00pm Time Out: 4:55pm   Treatment Type: Individual Therapy  Reported Symptoms: Pt is using her Notebook to track the connection btwn her emot's & how they related to food. She has been Dx'd w/Binge Eating d/o & this is helping her to make connections.   Mental Status Exam: Appearance:  Casual     Behavior: Appropriate and Sharing  Motor: Normal  Speech/Language:  Clear and Coherent  Affect: Appropriate  Mood: normal  Thought process: normal  Thought content:   Rumination  Sensory/Perceptual disturbances:   WNL  Orientation: oriented to person, place, time/date, and situation  Attention: Good  Concentration: Good  Memory: WNL  Fund of knowledge:  Good  Insight:   Fair  Judgment:  Fair  Impulse Control: Fair   Risk Assessment: Danger to Self:  No Self-injurious Behavior: No Danger to Others: No Duty to Warn:no Physical Aggression / Violence:No  Access to Firearms a concern: No  Gang Involvement:No   Subjective: Pt is more upbeat today. She has started taking Vyvanse & this medication is helping her feel the sensation of being full so she can curb binging beh. She has no cravings for sugar now. This is also helping her w/food.  Pt has a R elbow tendon pull & wears a brace today to assist w/pain. She is getting her Dtr readied for the start of Sch & being realistic about how much she can participate.   Interventions: Family Systems  Diagnosis:Anxiety and depression  Binge eating disorder  Plan: Diana King has been more successful with her eating habits since starting Vyvanse. She finds it helpfu & is glad this medication is available. She is learning about the connections btwn her emots & food-this has been helpful to track in her Notebook. She will cont this practice & report her  progress next session.   Target Date: 10/06/2022  Progress: 6  Frequency: Once every 2-3 wks  Modality: Claretta Fraise, LMFT

## 2022-09-19 NOTE — Progress Notes (Signed)
Aleen Sells [name] [address] [place] [address] Phone: [phone]   Assessment and Plan:     1. Acute pain of left knee 2. Hoffa's fat pad disease (HCC)  -Acute, initial sports medicine visit - Most consistent with Hoffa's fat pad syndrome with patient likely having impingement of Hoffa's fat pad during an exercise creating inflammation and reproducible "snapping".  No pain with palpation of patella, patellar tendon, tibial tuberosity, and full strength with no pain with resisted knee extension, so I do not suspect patellar or patellar tendon pathology. - Start meloxicam 15 mg daily x2 weeks.  If still having pain after 2 weeks, complete 3rd-week of meloxicam. May use remaining meloxicam as needed once daily for pain control.  Do not to use additional NSAIDs while taking meloxicam.  May use Tylenol [phone] mg 2 to 3 times a day for breakthrough pain. - Recommend relative rest and decreased lower extremity physical activity for 1 week - May ice area for 48 hours after injury - May use topical Voltaren gel over areas of pain  Pertinent previous records reviewed include none   Follow Up: As needed in 1 to 2 weeks if no improvement.  Could consider x-ray versus ultrasound   Subjective:   I, Moenique Parris, am serving as a Neurosurgeon for Doctor Richardean Sale  Chief Complaint: left knee pain   HPI:   09/19/22 [name] complaining of left knee pain. Patient states  yesterday she was getting up off the floor and when she stepped up it felt like her  knee cap popped out of place.  immediate reaction was to pop it back in, but that was not the issue. cannot fully extend her leg without extreme pain below the knee cap. iced, ibuprofen, voltaren gel and rest, but when when she  was laying in bed , still couldn't extend her leg.   Relevant Historical Information: None pertinent  Additional pertinent review of systems  negative.   Current Outpatient Medications:    Adapalene 0.3 % gel, Apply 1 application topically at bedtime., Disp: 45 g, Rfl: 5   buPROPion (WELLBUTRIN XL) 300 MG 24 hr tablet, Take 1 tablet (300 mg total) by mouth daily., Disp: 90 tablet, Rfl: 3   busPIRone (BUSPAR) 5 MG tablet, TAKE 1 TABLET BY MOUTH TWICE A DAY, Disp: 180 tablet, Rfl: 3   drospirenone-ethinyl estradiol (YAZ,GIANVI,LORYNA) 3-0.02 MG tablet, Take 1 tablet by mouth daily., Disp: , Rfl:    escitalopram (LEXAPRO) 10 MG tablet, Take 1 tablet (10 mg total) by mouth daily., Disp: 90 tablet, Rfl: 1   lisdexamfetamine (VYVANSE) 30 MG capsule, Take 1 capsule (30 mg total) by mouth daily., Disp: 30 capsule, Rfl: 0   meloxicam (MOBIC) 15 MG tablet, Take 1 tablet (15 mg total) by mouth daily., Disp: 30 tablet, Rfl: 0   spironolactone (ALDACTONE) 50 MG tablet, Take 50 mg by mouth daily., Disp: , Rfl:    tiZANidine (ZANAFLEX) 4 MG tablet, TAKE 1 TABLET BY MOUTH EVERYDAY AT BEDTIME, Disp: 90 tablet, Rfl: 1   Vitamin D, Ergocalciferol, (DRISDOL) 1.25 MG (50000 UNIT) CAPS capsule, Take 1 capsule (50,000 Units total) by mouth every 7 (seven) days., Disp: 12 capsule, Rfl: 0   Objective:     Vitals:   09/19/22 0758  BP: 132/80  Pulse: 81  SpO2: 99%  Weight: 210 lb (95.3 kg)  Height: 5\' 6"  (1.676 m)      Body  mass index is 33.89 kg/m.    Physical Exam:    General:  awake, alert oriented, no acute distress nontoxic Skin: no suspicious lesions or rashes Neuro:sensation intact and strength 5/5 with no deficits, no atrophy, normal muscle tone Psych: No signs of anxiety, depression or other mood disorder  Left knee: No swelling No deformity Neg fluid wave, joint milking ROM Flex 110 , Ext 0 with sharp pain at endrange NTTP over the quad tendon, medial fem condyle, lat fem condyle, patella, plica, patella tendon, tibial tuberostiy, fibular head, posterior fossa, pes anserine bursa, gerdy's tubercle, medial jt line, lateral jt  line Neg anterior and posterior drawer Neg lachman Neg sag sign Negative varus stress Negative valgus stress No pain and full strength with resisted knee extension  Gait normal    Electronically signed by:  Aleen Sells [name] 8:11 AM 09/19/22

## 2022-09-19 NOTE — Progress Notes (Signed)
[name] [address] [place] [address] Phone: [phone] Subjective:    I'm seeing this patient by the request  of:  Pincus Sanes, MD  CC: Right elbow pain  [id number]  08/29/2022 Seen for R elbow pain  Updated 09/26/2022 Diana King is a 32 y.o. female coming in with complaint of elbow pain. elbow is a lot better than it was, but still having some issues when fully extending my arm. also noticed pain on the medial aspect of my elbow as well. Started 3 days ago, it is a sore type feeling and only on full extension. Meloxicam and Tylenol 1000mg  3 times a day is what gives full relief. Voltaren Gel is the only thing used the last two days. pain seems to be better around mid day, pain is worse in the morning after sleeping.  My shoulder, neck and back is tight definitely ready for OMT before Leaving for vacation.        Past Medical History:  Diagnosis Date   Acne    Anxiety    Back pain    Depression    GERD (gastroesophageal reflux disease)    IBS (irritable bowel syndrome)    Overweight    Pain in Achilles tendon    Palpitations    Past Surgical History:  Procedure Laterality Date   NO PAST SURGERIES     Social History   Socioeconomic History   Marital status: Married    Spouse name: Dustin   Number of children: 1   Years of education: Not on file   Highest education level: Associate degree: academic program  Occupational History   Occupation: CMA  Tobacco Use   Smoking status: Never   Smokeless tobacco: Never  Substance and Sexual Activity   Alcohol use: Yes    Comment: social   Drug use: Not on file   Sexual activity: Not on file  Other Topics Concern   Not on file  Social History Narrative   Not on file   Social Determinants of Health   Financial Resource Strain: Low Risk  (07/11/2022)   Overall Financial Resource Strain (CARDIA)    Difficulty of Paying Living Expenses: Not hard at all  Food  Insecurity: No Food Insecurity (07/11/2022)   Hunger Vital Sign    Worried About Running Out of Food in the Last Year: Never true    Ran Out of Food in the Last Year: Never true  Transportation Needs: No Transportation Needs (07/11/2022)   PRAPARE - Administrator, Civil Service (Medical): No    Lack of Transportation (Non-Medical): No  Physical Activity: Insufficiently Active (07/11/2022)   Exercise Vital Sign    Days of Exercise per Week: 3 days    Minutes of Exercise per Session: 20 min  Stress: Stress Concern Present (07/11/2022)   [employer] of Occupational Health - Occupational Stress Questionnaire    Feeling of Stress : To some extent  Social Connections: Unknown (07/11/2022)   Social Connection and Isolation Panel [NHANES]    Frequency of Communication with Friends and Family: Patient declined    Frequency of Social Gatherings with Friends and Family: Patient declined    Attends Religious Services: 1 to 4 times per year    Active Member of [employer] or Organizations: Yes    Attends Banker Meetings: 1 to 4 times per year    Marital Status: Married   Allergies  Allergen Reactions   Latex  Swelling   Topamax [Topiramate] Other (See Comments)   Gabapentin    Family History  Problem Relation Age of Onset   Fibromyalgia Mother    Interstitial cystitis Mother    Migraines Mother    Depression Mother    Anxiety disorder Mother    Alcohol abuse Maternal Aunt    Mental illness Maternal Aunt    Alcohol abuse Maternal Uncle    Thyroid disease Maternal Grandmother    Hyperlipidemia Father    Alcohol abuse Father     Current Outpatient Medications (Endocrine & Metabolic):    drospirenone-ethinyl estradiol (YAZ,GIANVI,LORYNA) 3-0.02 MG tablet, Take 1 tablet by mouth daily.  Current Outpatient Medications (Cardiovascular):    spironolactone (ALDACTONE) 50 MG tablet, Take 50 mg by mouth daily.   Current Outpatient Medications (Analgesics):     meloxicam (MOBIC) 15 MG tablet, Take 1 tablet (15 mg total) by mouth daily.   Current Outpatient Medications (Other):    Adapalene 0.3 % gel, Apply 1 application topically at bedtime.   buPROPion (WELLBUTRIN XL) 300 MG 24 hr tablet, Take 1 tablet (300 mg total) by mouth daily.   busPIRone (BUSPAR) 5 MG tablet, TAKE 1 TABLET BY MOUTH TWICE A DAY   escitalopram (LEXAPRO) 10 MG tablet, Take 1 tablet (10 mg total) by mouth daily.   lisdexamfetamine (VYVANSE) 40 MG capsule, Take 1 capsule (40 mg total) by mouth every morning.   tiZANidine (ZANAFLEX) 4 MG tablet, TAKE 1 TABLET BY MOUTH EVERYDAY AT BEDTIME   Vitamin D, Ergocalciferol, (DRISDOL) 1.25 MG (50000 UNIT) CAPS capsule, Take 1 capsule (50,000 Units total) by mouth every 7 (seven) days.   Reviewed prior external information including notes and imaging from  primary care provider As well as notes that were available from care everywhere and other healthcare systems.  Past medical history, social, surgical and family history all reviewed in electronic medical record.  No pertanent information unless stated regarding to the chief complaint.   Review of Systems:  No headache, visual changes, nausea, vomiting, diarrhea, constipation, dizziness, abdominal pain, skin rash, fevers, chills, night sweats, weight loss, swollen lymph nodes, body aches, joint swelling, chest pain, shortness of breath, mood changes. POSITIVE muscle aches  Objective  Height 5\' 6"  (1.676 m).   General: No apparent distress alert and oriented x3 mood and affect normal, dressed appropriately.  HEENT: Pupils equal, extraocular movements intact  Respiratory: Patient's speak in full sentences and does not appear short of breath  Cardiovascular: No lower extremity edema, non tender, no erythema  Right elbow does have some tenderness to palpation over the lateral epicondylar region.  Patient does have some tightness noted with cervical range of motion noted.  Patient is  tender to palpation over the lateral epicondylar area  Procedure: Real-time Ultrasound Guided Injection of extensor tendon sheath Device: GE Logiq Q7 Ultrasound guided injection is preferred based studies that show increased duration, increased effect, greater accuracy, decreased procedural pain, increased response rate, and decreased cost with ultrasound guided versus blind injection.  Verbal informed consent obtained.  Time-out conducted.  Noted no overlying erythema, induration, or other signs of local infection.  Skin prepped in a sterile fashion.  Local anesthesia: Topical Ethyl chloride.  With sterile technique and under real time ultrasound guidance: With a 25-gauge half inch needle injected with 0.5 cc of 0.5% Marcaine and 0.5 cc of Kenalog 40 mg/mL. Completed without difficulty  Pain immediately resolved suggesting accurate placement of the medication.  Advised to call if fevers/chills, erythema, induration, drainage,  or persistent bleeding.  Impression: Technically successful ultrasound guided injection.    Impression and Recommendations:     The above documentation has been reviewed and is accurate and complete Judi Saa, DO

## 2022-09-23 ENCOUNTER — Encounter: Payer: Self-pay | Admitting: Internal Medicine

## 2022-09-24 ENCOUNTER — Ambulatory Visit: Payer: BC Managed Care – PPO | Admitting: Behavioral Health

## 2022-09-24 DIAGNOSIS — Z789 Other specified health status: Secondary | ICD-10-CM | POA: Diagnosis not present

## 2022-09-24 DIAGNOSIS — F5081 Binge eating disorder: Secondary | ICD-10-CM | POA: Diagnosis not present

## 2022-09-24 DIAGNOSIS — F32A Depression, unspecified: Secondary | ICD-10-CM

## 2022-09-24 DIAGNOSIS — F419 Anxiety disorder, unspecified: Secondary | ICD-10-CM

## 2022-09-24 MED ORDER — LISDEXAMFETAMINE DIMESYLATE 40 MG PO CAPS: 40.0000 mg | ORAL_CAPSULE | ORAL | 0 refills | Status: DC

## 2022-09-24 NOTE — Progress Notes (Signed)
[facility] Counselor/Therapist Progress Note  Patient ID: Diana King, MRN: [id number],    Date: 09/24/2022  Time Spent: 55 min In Person @ [facility] Office Time In: 11:00am Time Out: 11:55am    Treatment Type: Individual Therapy  Reported Symptoms: Intensified initial rxn this morning to anxiety is Pt c/o  "body vibrating & shakiness:   Mental Status Exam: Appearance:  Casual     Behavior: Appropriate and Sharing  Motor: Restlestness  Speech/Language:  Clear and Coherent  Affect: Appropriate and Tearful  Mood: anxious  Thought process: normal  Thought content:   WNL  Sensory/Perceptual disturbances:   WNL  Orientation: oriented to person, place, time/date, and situation  Attention: Good  Concentration: Good  Memory: WNL  Fund of knowledge:  Good  Insight:   Fair  Judgment:  Good  Impulse Control: Good   Risk Assessment: Danger to Self:  No Self-injurious Behavior: No Danger to Others: No Duty to Warn:no Physical Aggression / Violence:No  Access to Firearms a concern: No  Gang Involvement:No   Subjective: Pt is tearful & upset today c/o Px rxns related to anxiety. Pt sts her BPH reading in her Office was: 138/84 & HR was 110. Reviewed Pt medications & monitored for Wellbutrin 300mg  daily, Vyvanse 40mg , Lexapro 10mg  daily & Buspar 5mg  BID. Suggested to Pt she contact her Prescriber & run these Sx by her today prior to vacation when she does not want this happening again.  Upon completion of our discussion, Pt related she had been able to use a new coffee machine that allowed her to try many different types of coffee beverages over the wknd while staying @ her Mother's home in [place]. Discussed the sig cardiac impact over consumption of caffiene can have on someone using stimulant medications. Cautioned Pt against excess consumption as she has a headache today after just one cup & multiple cups over the wknd ea day. Pt acknowledged & agreed to check  w/Prescriber for more info.  Discussed Pt levels of discomfort w/doing things outside the home once she returns after work. Directed Pt to consider having Husb Dustin come in for an addt'l session @ her discretion for psychoedu so he can understand Pt's mental issues more thoroughly. Pt agreed.   Discussed the ways Pt can handle the rest of her day using her breaks judiciously & leaving early w/Mgmt support if necessary. Pt agreed.   Pt sts she is leaving visit today calmer. Pt instructed to take her BPH & HR readings again when back @ the Office. Pt agreed.   Interventions: Cognitive Behavioral Therapy, Psycho-education/Bibliotherapy, and anxiety-reducing techniques for work day & rest of work week prior to vacation  Diagnosis:Anxiety and depression  Binge eating disorder  Excessive caffeine intake  Plan: Pt will consult w/PCP Dr. Cheryll Cockayne, MD whose Office is directly above her own. She can send a MyChart msg or go up directly. She is to take updated readings of her vitals when she returns to the Office. She is to limit her usually very busy nature to a conc on keeping herself calm & un-agitated & if she needs to leave she can do so w/the support of Ruth. Mardella Layman is able to contact this Clinician if necessary. Pt acknowledged & agreed to all instructions provided.   [name] is to use soothing, calming music when she has her ipod in her ears & to keep stimulation to a minimum the rest of the day. She is to use these tools  prn into the future.  Target Date: 10/06/2022  Progress: 4  Frequency: Once every 2-3 wks  Modality: Claretta Fraise, LMFT

## 2022-09-24 NOTE — Progress Notes (Signed)
                Victoria L Winstead, LMFT 

## 2022-09-26 ENCOUNTER — Ambulatory Visit: Payer: BC Managed Care – PPO | Admitting: Family Medicine

## 2022-09-26 ENCOUNTER — Other Ambulatory Visit: Payer: Self-pay

## 2022-09-26 ENCOUNTER — Encounter: Payer: Self-pay | Admitting: Family Medicine

## 2022-09-26 VITALS — Ht 66.0 in

## 2022-09-26 DIAGNOSIS — M7711 Lateral epicondylitis, right elbow: Secondary | ICD-10-CM | POA: Diagnosis not present

## 2022-09-26 DIAGNOSIS — M25521 Pain in right elbow: Secondary | ICD-10-CM | POA: Diagnosis not present

## 2022-09-26 NOTE — Assessment & Plan Note (Signed)
Patient given injection and tolerated the procedure well, discussed icing regimen and home exercises, discussed which activities to do and which ones to avoid.  Increase activity slowly.  Patient will wear the wrist brace day and night for the next 72 hours then discontinue to see how patient does.  Follow-up with me again in 6 to 8 weeks otherwise.

## 2022-09-26 NOTE — Patient Instructions (Signed)
Injection in elbow today

## 2022-10-02 ENCOUNTER — Other Ambulatory Visit: Payer: Self-pay | Admitting: Internal Medicine

## 2022-10-03 ENCOUNTER — Telehealth: Payer: Self-pay

## 2022-10-03 NOTE — Telephone Encounter (Signed)
Not using this for ADD therefore there is no alternative --- not sure if a different pharmacy has it - [facility] could try calling around.  We could also go back to the 30 mg for now if needed.

## 2022-10-03 NOTE — Telephone Encounter (Signed)
Mychart message sent to patient.

## 2022-10-15 ENCOUNTER — Ambulatory Visit: Payer: BC Managed Care – PPO | Admitting: Behavioral Health

## 2022-10-15 ENCOUNTER — Encounter (INDEPENDENT_AMBULATORY_CARE_PROVIDER_SITE_OTHER): Payer: Self-pay | Admitting: Internal Medicine

## 2022-10-15 DIAGNOSIS — F419 Anxiety disorder, unspecified: Secondary | ICD-10-CM

## 2022-10-15 DIAGNOSIS — F32A Depression, unspecified: Secondary | ICD-10-CM

## 2022-10-15 NOTE — Progress Notes (Signed)
                Anastasya Jewell L Farryn Linares, LMFT 

## 2022-10-15 NOTE — Progress Notes (Signed)
 [facility] Counselor/Therapist Progress Note  Patient ID: Diana King, MRN: [id number],    Date: 10/15/2022  Time Spent: 55 min In Person @ [facility] Office Time In: 4:00pm Time Out: 4:55pm  Treatment Type: Individual Therapy  Reported Symptoms: Elevated anx/dep & stress due to inability to secure her Vyvanse prescription & feeling down, low motivateion, nervous agitation, & little interest in her normal activities for one week now.  Mental Status Exam: Appearance:  Casual     Behavior: Appropriate and Sharing  Motor: Normal  Speech/Language:  Clear and Coherent and Normal Rate  Affect: Appropriate  Mood: normal  Thought process: normal  Thought content:   WNL  Sensory/Perceptual disturbances:   WNL  Orientation: oriented to person, place, time/date, and situation  Attention: Good  Concentration: Good  Memory: WNL  Fund of knowledge:  Good  Insight:   Good  Judgment:  Good  Impulse Control: Good   Risk Assessment: Danger to Self:  No Self-injurious Behavior: No Danger to Others: No Duty to Warn:no Physical Aggression / Violence:No  Access to Firearms a concern: No  Gang Involvement:No   Subjective: Pt is upset today due to her anticipation of exp of SAD that bothers her yearly. She has never anticipated her dep, but this year it is heavy for her. She has reduced desire to plan an elaborate Bday Party for her Dtr & has kept plans easy & minimal. At work, she does not wish to have small chat w/any Co-Workers; the effort is too hard. She is spending her time reading & has consumed 6 books in the last few wks.   Pt is having pain & discomfort in her R arm; elbow injury on vacation has now turned to shoulder issues she will address w/Dr. Katrinka Blazing this Friday. She is not sleeping well; waking 1-4 times/night. This has made her very tired.   Pt is frustrated w/her situation mentally & wants relief w/out the standard answers that are all common sense. She needs  a new perspective & alternative answers that serve her best.  Interventions: Family Systems and Interpersonal & psychoedu re: Parenting Styles  Diagnosis:Anxiety and depression  Plan: [name] needs encouragement for her Parental voice & a unified standing w/her Husb Dustin. This will dec frustration & show a unified front to Dtr who needs to take them seriously. We will plan a Cpl Th session to address issues mentioned today so [name] can benefit from their discussion.  Target Date: 11/20/2022  Progress: 3  Frequency: Once monthly as indicated  Modality: Alyse Low will watch Ruby Wax's video on Mental Health (8:32).  Target Date: 11/05/2022  Progress: 0  Frequency: Once every 2-3 wks  Modality: Claretta Fraise, LMFT

## 2022-10-16 DIAGNOSIS — F419 Anxiety disorder, unspecified: Secondary | ICD-10-CM

## 2022-10-16 DIAGNOSIS — F32A Depression, unspecified: Secondary | ICD-10-CM

## 2022-10-16 NOTE — Progress Notes (Signed)
[facility] [address] [place] [address] Phone: [phone] Subjective:    I'm seeing this patient by the request  of:  Pincus Sanes, MD  CC: right elbow pain follow up   [id number]  09/26/2022 Patient given injection and tolerated the procedure well, discussed icing regimen and home exercises, discussed which activities to do and which ones to avoid.  Increase activity slowly.  Patient will wear the wrist brace day and night for the next 72 hours then discontinue to see how patient does.  Follow-up with me again in 6 to 8 weeks otherwise.      Update 10/17/2022 Diana King is a 32 y.o. female coming in with complaint of R elbow pain. Patient states that her elbow is better. Pain with full flexion. Pain will last for almost an hour after she extends her elbow. R shoulder pain in posterior aspect since elbow injury. Was elevating shoulder to type due to wrist brace. IR and horizontal abduction will increase pain. IBU helpful.       Past Medical History:  Diagnosis Date   Acne    Anxiety    Back pain    Depression    GERD (gastroesophageal reflux disease)    IBS (irritable bowel syndrome)    Overweight    Pain in Achilles tendon    Palpitations    Past Surgical History:  Procedure Laterality Date   NO PAST SURGERIES     Social History   Socioeconomic History   Marital status: Married    Spouse name: Dustin   Number of children: 1   Years of education: Not on file   Highest education level: Associate degree: academic program  Occupational History   Occupation: CMA  Tobacco Use   Smoking status: Never   Smokeless tobacco: Never  Substance and Sexual Activity   Alcohol use: Yes    Comment: social   Drug use: Not on file   Sexual activity: Not on file  Other Topics Concern   Not on file  Social History Narrative   Not on file   Social Determinants of Health   Financial Resource Strain: Low Risk  (07/11/2022)    Overall Financial Resource Strain (CARDIA)    Difficulty of Paying Living Expenses: Not hard at all  Food Insecurity: No Food Insecurity (07/11/2022)   Hunger Vital Sign    Worried About Running Out of Food in the Last Year: Never true    Ran Out of Food in the Last Year: Never true  Transportation Needs: No Transportation Needs (07/11/2022)   PRAPARE - Administrator, Civil Service (Medical): No    Lack of Transportation (Non-Medical): No  Physical Activity: Insufficiently Active (07/11/2022)   Exercise Vital Sign    Days of Exercise per Week: 3 days    Minutes of Exercise per Session: 20 min  Stress: Stress Concern Present (07/11/2022)   [employer] of Occupational Health - Occupational Stress Questionnaire    Feeling of Stress : To some extent  Social Connections: Unknown (07/11/2022)   Social Connection and Isolation Panel [NHANES]    Frequency of Communication with Friends and Family: Patient declined    Frequency of Social Gatherings with Friends and Family: Patient declined    Attends Religious Services: 1 to 4 times per year    Active Member of [employer] or Organizations: Yes    Attends Banker Meetings: 1 to 4 times per year  Marital Status: Married   Allergies  Allergen Reactions   Latex Swelling   Topamax [Topiramate] Other (See Comments)   Gabapentin    Family History  Problem Relation Age of Onset   Fibromyalgia Mother    Interstitial cystitis Mother    Migraines Mother    Depression Mother    Anxiety disorder Mother    Alcohol abuse Maternal Aunt    Mental illness Maternal Aunt    Alcohol abuse Maternal Uncle    Thyroid disease Maternal Grandmother    Hyperlipidemia Father    Alcohol abuse Father     Current Outpatient Medications (Endocrine & Metabolic):    drospirenone-ethinyl estradiol (YAZ,GIANVI,LORYNA) 3-0.02 MG tablet, Take 1 tablet by mouth daily.  Current Outpatient Medications (Cardiovascular):    spironolactone  (ALDACTONE) 50 MG tablet, Take 50 mg by mouth daily.   Current Outpatient Medications (Analgesics):    meloxicam (MOBIC) 15 MG tablet, Take 1 tablet (15 mg total) by mouth daily.   Current Outpatient Medications (Other):    Adapalene 0.3 % gel, Apply 1 application topically at bedtime.   buPROPion (WELLBUTRIN XL) 300 MG 24 hr tablet, Take 1 tablet (300 mg total) by mouth daily.   busPIRone (BUSPAR) 5 MG tablet, TAKE 1 TABLET BY MOUTH TWICE A DAY   escitalopram (LEXAPRO) 10 MG tablet, TAKE 1 TABLET BY MOUTH EVERY DAY   lisdexamfetamine (VYVANSE) 40 MG capsule, Take 1 capsule (40 mg total) by mouth every morning.   tiZANidine (ZANAFLEX) 4 MG tablet, TAKE 1 TABLET BY MOUTH EVERYDAY AT BEDTIME   Reviewed prior external information including notes and imaging from  primary care provider As well as notes that were available from care everywhere and other healthcare systems.  Past medical history, social, surgical and family history all reviewed in electronic medical record.  No pertanent information unless stated regarding to the chief complaint.   Review of Systems:  No headache, visual changes, nausea, vomiting, diarrhea, constipation, dizziness, abdominal pain, skin rash, fevers, chills, night sweats, weight loss, swollen lymph nodes, body aches, joint swelling, chest pain, shortness of breath, mood changes. POSITIVE muscle aches  Objective     General: No apparent distress alert and oriented x3 mood and affect normal, dressed appropriately.  HEENT: Pupils equal, extraocular movements intact  Respiratory: Patient's speak in full sentences and does not appear short of breath  Cardiovascular: No lower extremity edema, non tender, no erythema  Elbow exam shows very minimal tenderness over the lateral epicondylar area.  Patient's right shoulder does have positive impingement with Neer and Hawkins.  Rotator cuff strength is weak but secondary to some of it is seems to be guarding.  Patient  does have relatively good range of motion passively but actively does have some voluntary guarding.  Limited muscular skeletal ultrasound was performed and interpreted by Antoine Primas, M  Limited ultrasound shows the patient does have significant hypoechoic changes consistent with more of a subacromial bursitis.  Patient also has some mild hypoechoic changes of the acromioclavicular joint with patient actually having narrowing noted as well. Impression: ac joint effusion and sub acromial bursitis    After informed written and verbal consent, patient was seated on exam table. Right shoulder was prepped with alcohol swab and utilizing lateral approach, patient's right subacromial space was injected with 4:1  marcaine 0.5%: Kenalog 40mg /dL. Patient tolerated the procedure well without immediate complications.  After verbal consent patient was prepped with alcohol swab and with a 25-gauge half inch needle injected with 0.5 cc  of 0.5% Marcaine and 0.5 cc of Kenalog 40 mg/mL into the right acromioclavicular joint.  No blood loss, Band-Aid placed.  Postinjection instructions given   Impression and Recommendations:     The above documentation has been reviewed and is accurate and complete Judi Saa, DO

## 2022-10-17 DIAGNOSIS — L700 Acne vulgaris: Secondary | ICD-10-CM | POA: Diagnosis not present

## 2022-10-17 DIAGNOSIS — Z79899 Other long term (current) drug therapy: Secondary | ICD-10-CM | POA: Diagnosis not present

## 2022-10-18 ENCOUNTER — Ambulatory Visit: Payer: BC Managed Care – PPO | Admitting: Family Medicine

## 2022-10-18 DIAGNOSIS — M7551 Bursitis of right shoulder: Secondary | ICD-10-CM | POA: Insufficient documentation

## 2022-10-18 DIAGNOSIS — M25411 Effusion, right shoulder: Secondary | ICD-10-CM | POA: Insufficient documentation

## 2022-10-18 NOTE — Assessment & Plan Note (Signed)
Injection given today and tolerated the procedure well, discussed icing regimen and home exercises.  Discussed ergonomics.  Follow-up again in 6 weeks

## 2022-10-18 NOTE — Patient Instructions (Signed)
Ice Exercises Injections today

## 2022-10-18 NOTE — Assessment & Plan Note (Signed)
Patient given injection today and tolerated the procedure well, discussed icing regimen of home exercises, discussed which activities to do and which ones to avoid.  Increase activity slowly otherwise.  We discussed ergonomics and changing the keyboard in the mouth so patient is not causing impingement over and over again.

## 2022-10-19 MED ORDER — BUPROPION HCL ER (XL) 150 MG PO TB24: 150.0000 mg | ORAL_TABLET | Freq: Every day | ORAL | 1 refills | Status: DC

## 2022-10-19 NOTE — Telephone Encounter (Signed)

## 2022-10-19 NOTE — Addendum Note (Signed)
Addended by: Pincus Sanes on: 10/19/2022 06:40 PM   Modules accepted: Orders

## 2022-10-30 ENCOUNTER — Ambulatory Visit: Payer: BC Managed Care – PPO | Admitting: Behavioral Health

## 2022-10-30 DIAGNOSIS — F419 Anxiety disorder, unspecified: Secondary | ICD-10-CM | POA: Diagnosis not present

## 2022-10-30 DIAGNOSIS — F32A Depression, unspecified: Secondary | ICD-10-CM | POA: Diagnosis not present

## 2022-10-30 NOTE — Progress Notes (Signed)
                Diana King Diana Linares, LMFT 

## 2022-11-05 ENCOUNTER — Ambulatory Visit: Payer: BC Managed Care – PPO | Admitting: Behavioral Health

## 2022-11-06 NOTE — Progress Notes (Signed)
[facility] Counselor/Therapist Progress Note  Patient ID: Diana King, MRN: [id number],    Date: 10/30/2022  Time Spent: 55 min In person @ [facility] Office Time In: 4:00pm Time Out: 4:55pm   Treatment Type: Individual Therapy  Reported Symptoms: Pt is stressed & irritated today. She has been aggrivated w/her Mother & not communicating well about her feelings. She & her Mother are not back on positive terms yet. Pt feels this may be 'Inner Child' work she requires.  Mental Status Exam: Appearance:  Casual     Behavior: Appropriate and Sharing  Motor: Normal  Speech/Language:  Clear and Coherent  Affect: Negative  Mood: angry  Thought process: normal  Thought content:   WNL  Sensory/Perceptual disturbances:   WNL  Orientation: oriented to person, place, time/date, and situation  Attention: Good  Concentration: Good  Memory: WNL  Fund of knowledge:  Good  Insight:   Fair  Judgment:  Good  Impulse Control: Good   Risk Assessment: Danger to Self:  No Self-injurious Behavior: No Danger to Others: No Duty to Warn:no Physical Aggression / Violence:No  Access to Firearms a concern: No  Gang Involvement:No   Subjective: Pt is tense today & venting her feelings angrily. She knows on some level she has forgiven her Mother for having her @ 32yo which put the Family under many strains. She also knows her Mother was raised in a home that was not compassionate. Her Mother expressed to her & Sonda Primes they were a burden. These complaints caused many hard feelings on the part of Pt. She never felt heard by her Mother.  Interventions: Family Systems  Diagnosis:Anxiety and depression  Plan: Diana King has been extra emot'l lately & her Mother has been irritating w/her & Dtr Diana King. She will explore the materials & books avail for 'Inner Child' work & we will use this as our guide for the next session.  Target Date: 12/06/2022  Progress: 4  Frequency: Once every 2-3  wks  Modality: Claretta Fraise, LMFT

## 2022-11-13 ENCOUNTER — Ambulatory Visit: Payer: BC Managed Care – PPO | Admitting: Behavioral Health

## 2022-11-13 DIAGNOSIS — F419 Anxiety disorder, unspecified: Secondary | ICD-10-CM | POA: Diagnosis not present

## 2022-11-13 DIAGNOSIS — F32A Depression, unspecified: Secondary | ICD-10-CM

## 2022-11-13 NOTE — Progress Notes (Signed)
                Anastasya Jewell L Farryn Linares, LMFT 

## 2022-11-13 NOTE — Progress Notes (Signed)
[facility] Counselor/Therapist Progress Note  Patient ID: Diana King, MRN: [id number],    Date: 11/13/2022  Time Spent: 55 min In Person @ [facility] Office Time In: 10:00am Time Out: 10:55am   Treatment Type: Individual Therapy  Reported Symptoms: Elevated anx/dep & Family conflict w/a Parent  Mental Status Exam: Appearance:  Casual     Behavior: Appropriate and Sharing  Motor: Normal  Speech/Language:  Clear and Coherent and Normal Rate  Affect: Appropriate  Mood: anxious & irritable w/some anger  Thought process: normal  Thought content:   WNL  Sensory/Perceptual disturbances:   WNL  Orientation: oriented to person, place, time/date, and situation  Attention: Good  Concentration: Good  Memory: WNL  Fund of knowledge:  Good  Insight:   Good  Judgment:  Good  Impulse Control: Fair   Risk Assessment: Danger to Self:  No Self-injurious Behavior: No Danger to Others: No Duty to Warn:no Physical Aggression / Violence:No  Access to Firearms a concern: No  Gang Involvement:No   Subjective: Pt is upset w/her Mother today since she has been requesting to speak. Pt is highly triggered by Mother's request & angry as she describes her situation growing up since she was 32yo. Pt is struggling to maintain her boundaries w/her Mother & is focused in on her wish to keep her own Family first priority. She realizes her own Dtr is @ the age when things started to happen that caused Pt hurt & pain. She wants to prevent her Dtr from having similar experiences.   Interventions: Insight-Oriented and Family Systems & TIC  Diagnosis:Anxiety and depression  Plan: Diana King will use her Notebook more to record the feelings she is exp'g & to help her organize her thoughts when she is triggered by her Mother. This promotes an inc'd sense of control & functioning for Diana King. This will also help her feel the emot'l distance she needs to  be herself & not let everything her  Mother does bother her to the point of anger.  Target Date: 12/21/2022  Progress: 6  Frequency: Once every 2-3 wks  Modality: Claretta Fraise, LMFT

## 2022-11-25 ENCOUNTER — Ambulatory Visit (INDEPENDENT_AMBULATORY_CARE_PROVIDER_SITE_OTHER): Payer: BC Managed Care – PPO | Admitting: Behavioral Health

## 2022-11-25 DIAGNOSIS — F419 Anxiety disorder, unspecified: Secondary | ICD-10-CM

## 2022-11-25 DIAGNOSIS — F32A Depression, unspecified: Secondary | ICD-10-CM | POA: Diagnosis not present

## 2022-11-25 NOTE — Progress Notes (Signed)
[facility] Counselor/Therapist Progress Note  Patient ID: Diana King, MRN: [id number],    Date: 11/25/2022  Time Spent: 55 min Caregility video; Pt is @ home in private & Provider working remotely from [employer]. Pt is aware of risks/limitations of telehealth & consents to Tx today.  Time In: 4:00pm Time Out: 4:55pm   Treatment Type: Individual Therapy  Reported Symptoms: Elevated anx/dep & stress due to poor sleep.   Mental Status Exam: Appearance:  Casual     Behavior: Appropriate and Sharing  Motor: Normal  Speech/Language:  Clear and Coherent  Affect: Appropriate  Mood: normal  Thought process: normal  Thought content:   WNL  Sensory/Perceptual disturbances:   WNL  Orientation: oriented to person, place, time/date, and situation  Attention: Good  Concentration: Good  Memory: WNL  Fund of knowledge:  Good  Insight:   Good  Judgment:  Good  Impulse Control: Good   Risk Assessment: Danger to Self:  No Self-injurious Behavior: No Danger to Others: No Duty to Warn:no Physical Aggression / Violence:No  Access to Firearms a concern: No  Gang Involvement:No   Subjective: Pt has exp'd a recent sleep deficit that has caused fatigue @ work to the point it was disabling. Her work has been irritating since the initiation of a [facility] that is making no income for the Practice. Pt has inc'd workload that has caused her frustration & she has expressed this to others in her job.   Pt is stressed over the work situation & the issues she sees happening. 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 There is one Pod in particular that acts differently than the rest of the Practice. This makes for faulty communication.   Interventions: Insight-Oriented  Diagnosis:Anxiety and depression  Plan: Caylei is c/o the situation @ work. It has been frustrating & complicated to try & communicate effectively @ work. She has been unhappy & cannot feel  heard by her Mgmt She will shift her attitude to try & make herself more effective & less stressed.  Target Date: 12/21/2022  Progress: 6  Frequency: Once every 2-3 wks  Modality: Claretta Fraise, LMFT

## 2022-11-25 NOTE — Progress Notes (Signed)
                Anastasya Jewell L Farryn Linares, LMFT 

## 2022-11-29 ENCOUNTER — Encounter: Payer: Self-pay | Admitting: Family Medicine

## 2022-11-29 ENCOUNTER — Ambulatory Visit: Payer: BC Managed Care – PPO | Admitting: Family Medicine

## 2022-11-29 VITALS — Ht 66.0 in

## 2022-11-29 DIAGNOSIS — M545 Low back pain, unspecified: Secondary | ICD-10-CM | POA: Diagnosis not present

## 2022-11-29 DIAGNOSIS — M9902 Segmental and somatic dysfunction of thoracic region: Secondary | ICD-10-CM | POA: Diagnosis not present

## 2022-11-29 DIAGNOSIS — M9904 Segmental and somatic dysfunction of sacral region: Secondary | ICD-10-CM

## 2022-11-29 DIAGNOSIS — M9903 Segmental and somatic dysfunction of lumbar region: Secondary | ICD-10-CM

## 2022-11-29 NOTE — Progress Notes (Signed)
  [name] [address] [place] [address] Phone: [phone] Subjective:   INadine Counts, am serving as a scribe for Dr. Antoine Primas.  I'm seeing this patient by the request  of:  Pincus Sanes, MD  CC: Back and neck pain follow-up  [id number]  LASHONTE WIGHT is a 32 y.o. female coming in with complaint of back and neck pain Patient states unfortunately started having increasing discomfort and pain just this morning.  Affecting daily activities.  Having difficulty walking.  Feels more like a spasm.  Medications patient has been prescribed:   Taking:         Reviewed prior external information including notes and imaging from previsou exam, outside providers and external EMR if available.   As well as notes that were available from care everywhere and other healthcare systems.  Past medical history, social, surgical and family history all reviewed in electronic medical record.  No pertanent information unless stated regarding to the chief complaint.   Past Medical History:  Diagnosis Date   Acne    Anxiety    Back pain    Depression    GERD (gastroesophageal reflux disease)    IBS (irritable bowel syndrome)    Overweight    Pain in Achilles tendon    Palpitations     Allergies  Allergen Reactions   Latex Swelling   Topamax [Topiramate] Other (See Comments)   Gabapentin      Review of Systems:  No headache, visual changes, nausea, vomiting, diarrhea, constipation, dizziness, abdominal pain, skin rash, fevers, chills, night sweats, weight loss, swollen lymph nodes, body aches, joint swelling, chest pain, shortness of breath, mood changes. POSITIVE muscle aches  Objective  Height 5\' 6"  (1.676 m).   General: No apparent distress alert and oriented x3 mood and affect normal, dressed appropriately.  HEENT: Pupils equal, extraocular movements intact  Respiratory: Patient's speak in full sentences and does not appear  short of breath  Cardiovascular: No lower extremity edema, non tender, no erythema  Gait MSK:  Back tightness noted in the low back.  Does have almost a muscle spasm noted.  Seems to be over the right sacroiliac joint.  Osteopathic findings   T9 extended rotated and side bent left L2 flexed rotated and side bent right Sacrum right on right       Assessment and Plan:  No problem-specific Assessment & Plan notes found for this encounter.    Nonallopathic problems  Decision today to treat with OMT was based on Physical Exam  After verbal consent patient was treated with HVLA, ME, FPR techniques in thoracic, lumbar, and sacral  areas  Patient tolerated the procedure well with improvement in symptoms  Patient given exercises, stretches and lifestyle modifications  See medications in patient instructions if given  Patient will follow up prn       The above documentation has been reviewed and is accurate and complete Judi Saa, DO        Note: This dictation was prepared with Dragon dictation along with smaller phrase technology. Any transcriptional errors that result from this process are unintentional.

## 2022-11-29 NOTE — Assessment & Plan Note (Signed)
Exacerbation noted.  Discussed icing regimen increase activity slowly.  Follow-up again as needed

## 2022-12-02 ENCOUNTER — Encounter: Payer: Self-pay | Admitting: Family Medicine

## 2022-12-02 ENCOUNTER — Ambulatory Visit: Payer: BC Managed Care – PPO | Admitting: Family Medicine

## 2022-12-02 DIAGNOSIS — M545 Low back pain, unspecified: Secondary | ICD-10-CM

## 2022-12-02 DIAGNOSIS — M5459 Other low back pain: Secondary | ICD-10-CM | POA: Diagnosis not present

## 2022-12-02 NOTE — Progress Notes (Signed)
[name] [address] [place] [address] Phone: [phone] Subjective:    I'm seeing this patient by the request  of:  Pincus Sanes, MD  CC: Low back pain  [id number]  Diana King is a 32 y.o. female coming in with complaint of low back pain.  Continue to have low back pain.  Seen last week and states that improvement in range of motion but unfortunately has still significant amount of discomfort that did stop her from daily activities.  Any type of sharp movement or rotation seems to make it worse.  Denies any radiation down the leg     Past Medical History:  Diagnosis Date   Acne    Anxiety    Back pain    Depression    GERD (gastroesophageal reflux disease)    IBS (irritable bowel syndrome)    Overweight    Pain in Achilles tendon    Palpitations    Past Surgical History:  Procedure Laterality Date   NO PAST SURGERIES     Social History   Socioeconomic History   Marital status: Married    Spouse name: Dustin   Number of children: 1   Years of education: Not on file   Highest education level: Associate degree: academic program  Occupational History   Occupation: CMA  Tobacco Use   Smoking status: Never   Smokeless tobacco: Never  Substance and Sexual Activity   Alcohol use: Yes    Comment: social   Drug use: Not on file   Sexual activity: Not on file  Other Topics Concern   Not on file  Social History Narrative   Not on file   Social Determinants of Health   Financial Resource Strain: Low Risk  (07/11/2022)   Overall Financial Resource Strain (CARDIA)    Difficulty of Paying Living Expenses: Not hard at all  Food Insecurity: No Food Insecurity (07/11/2022)   Hunger Vital Sign    Worried About Running Out of Food in the Last Year: Never true    Ran Out of Food in the Last Year: Never true  Transportation Needs: No Transportation Needs (07/11/2022)   PRAPARE - Administrator, Civil Service  (Medical): No    Lack of Transportation (Non-Medical): No  Physical Activity: Insufficiently Active (07/11/2022)   Exercise Vital Sign    Days of Exercise per Week: 3 days    Minutes of Exercise per Session: 20 min  Stress: Stress Concern Present (07/11/2022)   [employer] of Occupational Health - Occupational Stress Questionnaire    Feeling of Stress : To some extent  Social Connections: Unknown (07/11/2022)   Social Connection and Isolation Panel [NHANES]    Frequency of Communication with Friends and Family: Patient declined    Frequency of Social Gatherings with Friends and Family: Patient declined    Attends Religious Services: 1 to 4 times per year    Active Member of [employer] or Organizations: Yes    Attends Banker Meetings: 1 to 4 times per year    Marital Status: Married   Allergies  Allergen Reactions   Latex Swelling   Topamax [Topiramate] Other (See Comments)   Gabapentin    Family History  Problem Relation Age of Onset   Fibromyalgia Mother    Interstitial cystitis Mother    Migraines Mother    Depression Mother    Anxiety disorder Mother    Alcohol abuse Maternal Aunt  Mental illness Maternal Aunt    Alcohol abuse Maternal Uncle    Thyroid disease Maternal Grandmother    Hyperlipidemia Father    Alcohol abuse Father     Current Outpatient Medications (Endocrine & Metabolic):    drospirenone-ethinyl estradiol (YAZ,GIANVI,LORYNA) 3-0.02 MG tablet, Take 1 tablet by mouth daily.  Current Outpatient Medications (Cardiovascular):    spironolactone (ALDACTONE) 50 MG tablet, Take 50 mg by mouth daily.   Current Outpatient Medications (Analgesics):    meloxicam (MOBIC) 15 MG tablet, Take 1 tablet (15 mg total) by mouth daily.   Current Outpatient Medications (Other):    Adapalene 0.3 % gel, Apply 1 application topically at bedtime.   buPROPion (WELLBUTRIN XL) 150 MG 24 hr tablet, Take 1 tablet (150 mg total) by mouth daily. For total of 450 mg  daily   buPROPion (WELLBUTRIN XL) 300 MG 24 hr tablet, Take 1 tablet (300 mg total) by mouth daily.   busPIRone (BUSPAR) 5 MG tablet, TAKE 1 TABLET BY MOUTH TWICE A DAY   escitalopram (LEXAPRO) 10 MG tablet, TAKE 1 TABLET BY MOUTH EVERY DAY   lisdexamfetamine (VYVANSE) 40 MG capsule, Take 1 capsule (40 mg total) by mouth every morning.   tiZANidine (ZANAFLEX) 4 MG tablet, TAKE 1 TABLET BY MOUTH EVERYDAY AT BEDTIME    Objective    General: No apparent distress alert and oriented x3 mood and affect normal, dressed appropriately.  HEENT: Pupils equal, extraocular movements intact  Respiratory: Patient's speak in full sentences and does not appear short of breath  Cardiovascular: No lower extremity edema, non tender, no erythema  Back exam does have some loss lordosis noted.  Some tenderness to palpation in the paraspinal musculature. Does have trigger points noted in the right paraspinal musculature.  Fairly severe overall.  Relatively good range of motion but worsening discomfort with extension.  No radicular symptoms.  Negative straight leg test  After verbal consent patient was prepped with alcohol swab and with a 25-gauge 1 inch needle injected in 4 distinct trigger points in the right lower lumbar region.  Total of 3 cc of 0.5% Marcaine and 1 cc of Kenalog 40 mg/mL used.  No blood loss.  Band-Aid placed.  Postinjection instructions given   Impression and Recommendations:    The above documentation has been reviewed and is accurate and complete Judi Saa, DO

## 2022-12-02 NOTE — Assessment & Plan Note (Signed)
Low back pain with trigger points noted today.  Discussed which activities to do and which ones to avoid.  Will consider Toradol and Depo-Medrol if needed.  Patient wants to avoid any prednisone.  Held on any more manipulation with patient actually having good relatively range of motion.  Follow-up again in 6 to 8 weeks

## 2022-12-03 ENCOUNTER — Ambulatory Visit: Payer: BC Managed Care – PPO | Admitting: Behavioral Health

## 2022-12-03 DIAGNOSIS — F32A Depression, unspecified: Secondary | ICD-10-CM

## 2022-12-03 DIAGNOSIS — F419 Anxiety disorder, unspecified: Secondary | ICD-10-CM

## 2022-12-03 NOTE — Progress Notes (Unsigned)
                Anastasya Jewell L Farryn Linares, LMFT 

## 2022-12-05 NOTE — Progress Notes (Addendum)
[facility] Counselor/Therapist Progress Note  Patient ID: Diana King, MRN: [id number],    Date: 12/03/2022  Time Spent: 55 min In Person @ [facility] Office Time In: 11:00am Time Out: 11:55am  Treatment Type:  Cpl Th  Reported Symptoms: Elevated anx/dep & stress due to dynamics in the extended [facility] & Parenting concerns for 32yo Dtr.   Mental Status Exam: Appearance:  Casual     Behavior: Appropriate and Sharing  Motor: Normal  Speech/Language:  Clear and Coherent and extra fast-pace today  Affect: Appropriate  Mood: anxious  Thought process: normal  Thought content:   WNL  Sensory/Perceptual disturbances:   WNL  Orientation: oriented to person, place, time/date, and situation  Attention: Good  Concentration: Good  Memory: WNL  Fund of knowledge:  Good  Insight:   Good  Judgment:  Good  Impulse Control: Fair; interupting Husb quite a bit today w/out awareness   Risk Assessment: Danger to Self:  No Self-injurious Behavior: No Danger to Others: No Duty to Warn:no Physical Aggression / Violence:No  Access to Firearms a concern: No  Gang Involvement:No   Subjective: Pt & Husb Diana King present today w/common Parenting concerns for [employer].  Pt requests they Parent more cooperatively. Ea Parent has their own style. Pt negotiates more & Husb wants requests done immediately. He admits coming from a Military Family exposed him to this expectation of his Dtr. Pt can & will delay a request until Dtr is finished w/an activitly.  Parents describe their styles differently. Pt relates how she "sees red, gets angry, & yells". Husb is demanding, but calmer.   Cpl is in need of private time for intimacy & cannot achieve this lately. They have done fun things tgthr in the past & enjoyed it.   Interventions: Family Systems  Diagnosis:Anxiety and depression  Plan: Diana King & Diana King will secure the book, 'Positive Parenting' so they can glean more tips &  suggestions about their Parenting Styles & how to work tgthr for [employer] best interest. They will calmly narrate their actions in front of Diana King when necessary & not promote her going from one Parent to another to get what she wants. This will dec the yelling on Saarah's part & dec her frustration w/Diana King when he gets demanding. They will ea allow the other to handle situations that are already in progress to reduce tension in the home. They will report their progress @ our next Cpl session & we may plan future Family Th sessions for impact.  Target Date: 01/05/2023  Progress: 5  Frequency: Once every 2-3 wks  Modality: Cpl Th Parents will inc their cooperative discipline approach. Diana King is the Parent in the home daily while Diana King is the Parent working on site for [facility]. They will strive to be consistent, firm, but not harsh, & fair w/Diana King. They will make these efforts & report back @ our next session.   Target Date: 01/05/2023  Progress: 3 per Cpl's reporting of successes  Frequency: Once every 2-3 wks  Modality: Cpl Th  Diana Lever, Diana King

## 2022-12-11 ENCOUNTER — Ambulatory Visit: Payer: BC Managed Care – PPO | Admitting: Behavioral Health

## 2022-12-11 NOTE — Progress Notes (Unsigned)
                Anastasya Jewell L Farryn Linares, LMFT 

## 2022-12-24 ENCOUNTER — Encounter: Payer: Self-pay | Admitting: Family Medicine

## 2022-12-25 ENCOUNTER — Other Ambulatory Visit: Payer: Self-pay

## 2022-12-25 MED ORDER — TIZANIDINE HCL 4 MG PO TABS: ORAL_TABLET | ORAL | 1 refills | Status: DC

## 2022-12-31 ENCOUNTER — Ambulatory Visit (INDEPENDENT_AMBULATORY_CARE_PROVIDER_SITE_OTHER): Payer: BC Managed Care – PPO | Admitting: Behavioral Health

## 2022-12-31 DIAGNOSIS — F419 Anxiety disorder, unspecified: Secondary | ICD-10-CM

## 2022-12-31 DIAGNOSIS — F32A Depression, unspecified: Secondary | ICD-10-CM

## 2022-12-31 NOTE — Progress Notes (Signed)
                Anastasya Jewell L Farryn Linares, LMFT 

## 2023-01-02 ENCOUNTER — Ambulatory Visit: Payer: BC Managed Care – PPO | Admitting: Sports Medicine

## 2023-01-02 NOTE — Progress Notes (Unsigned)
    [facility] [address] [place] [address] Phone: [phone]   Assessment and Plan:     There are no diagnoses linked to this encounter.  ***   Pertinent previous records reviewed include ***    Follow Up: ***     Subjective:   I, Diana King, am serving as a Neurosurgeon for Diana King  Chief Complaint: neck pain   HPI:   01/02/23 [name] with neck pain.patient states   Relevant Historical Information: ***  Additional pertinent review of systems negative.   Current Outpatient Medications:    Adapalene 0.3 % gel, Apply 1 application topically at bedtime., Disp: 45 g, Rfl: 5   buPROPion (WELLBUTRIN XL) 150 MG 24 hr tablet, Take 1 tablet (150 mg total) by mouth daily. For total of 450 mg daily, Disp: 90 tablet, Rfl: 1   buPROPion (WELLBUTRIN XL) 300 MG 24 hr tablet, Take 1 tablet (300 mg total) by mouth daily., Disp: 90 tablet, Rfl: 3   busPIRone (BUSPAR) 5 MG tablet, TAKE 1 TABLET BY MOUTH TWICE A DAY, Disp: 180 tablet, Rfl: 3   drospirenone-ethinyl estradiol (YAZ,GIANVI,LORYNA) 3-0.02 MG tablet, Take 1 tablet by mouth daily., Disp: , Rfl:    escitalopram (LEXAPRO) 10 MG tablet, TAKE 1 TABLET BY MOUTH EVERY DAY, Disp: 90 tablet, Rfl: 1   lisdexamfetamine (VYVANSE) 40 MG capsule, Take 1 capsule (40 mg total) by mouth every morning., Disp: 30 capsule, Rfl: 0   meloxicam (MOBIC) 15 MG tablet, Take 1 tablet (15 mg total) by mouth daily., Disp: 30 tablet, Rfl: 0   spironolactone (ALDACTONE) 50 MG tablet, Take 50 mg by mouth daily., Disp: , Rfl:    tiZANidine (ZANAFLEX) 4 MG tablet, TAKE 1 TABLET BY MOUTH EVERYDAY AT BEDTIME, Disp: 90 tablet, Rfl: 1   Objective:     There were no vitals filed for this visit.    There is no height or weight on file to calculate BMI.    Physical Exam:    ***   Electronically signed by:  [facility] 1:00 PM  01/02/23

## 2023-01-03 ENCOUNTER — Ambulatory Visit: Payer: BC Managed Care – PPO | Admitting: Sports Medicine

## 2023-01-03 VITALS — BP 126/82 | HR 86 | Ht 66.0 in | Wt 210.0 lb

## 2023-01-03 DIAGNOSIS — S46812A Strain of other muscles, fascia and tendons at shoulder and upper arm level, left arm, initial encounter: Secondary | ICD-10-CM | POA: Diagnosis not present

## 2023-01-03 MED ORDER — MELOXICAM 15 MG PO TABS: 15.0000 mg | ORAL_TABLET | Freq: Every day | ORAL | 0 refills | Status: DC

## 2023-01-03 MED ORDER — KETOROLAC TROMETHAMINE 60 MG/2ML IM SOLN
60.0000 mg | Freq: Once | INTRAMUSCULAR | Status: AC
Start: 2023-01-03 — End: 2023-01-03
  Administered 2023-01-03: 60 mg via INTRAMUSCULAR

## 2023-01-03 MED ORDER — CYCLOBENZAPRINE HCL 5 MG PO TABS: 5.0000 mg | ORAL_TABLET | Freq: Every day | ORAL | 0 refills | Status: DC | PRN

## 2023-01-03 MED ORDER — METHYLPREDNISOLONE ACETATE 80 MG/ML IJ SUSP
80.0000 mg | Freq: Once | INTRAMUSCULAR | Status: AC
Start: 2023-01-03 — End: 2023-01-03
  Administered 2023-01-03: 80 mg via INTRAMUSCULAR

## 2023-01-03 NOTE — Patient Instructions (Addendum)
Flexeril 5-10  mg 2 x a day ... As needed for muscle spasm Do not recommend taking tizanidine at the same time as flexeril  - Start meloxicam 15 mg daily x2 weeks.  If still having pain after 2 weeks, complete 3rd-week of NSAID. May use remaining NSAID as needed once daily for pain control.  Do not to use additional over-the-counter NSAIDs (ibuprofen, naproxen, Advil, Aleve) while taking prescription NSAIDs.  May use Tylenol [phone] mg 2 to 3 times a day for breakthrough pain. 1 week follow up

## 2023-01-03 NOTE — Progress Notes (Signed)
Aleen Sells [name] [address] [place] [address] Phone: [phone]   Assessment and Plan:     1. Strain of left trapezius muscle, initial encounter -Acute, initial visit - Consistent with strain of left trapezius muscle - Pain has mildly improved over the past 24 hours, however pain is currently not adequately controlled with tizanidine, ibuprofen, Tylenol - Start Flexeril 5 to 10 mg twice daily as needed for muscle spasms.  Do not use Flexeril and tizanidine within 8 hours of each other - Start meloxicam 15 mg daily x2 weeks.  If still having pain after 2 weeks, complete 3rd-week of NSAID. May use remaining NSAID as needed once daily for pain control.  Do not to use additional over-the-counter NSAIDs (ibuprofen, naproxen, Advil, Aleve) while taking prescription NSAIDs.  May use Tylenol [phone] mg 2 to 3 times a day for breakthrough pain. - Patient elected for IM injection of methylprednisone 80 mg/Toradol 60 mg.  Injection given in clinic today and tolerated well. -Patient elected for trigger point injections.  Tolerated well per note below  Trigger Point Injection: After informed consent was obtained, skin cleaned with alcohol  prep.  A total of 3 trigger points identified along left trapezius.  Injections given over area of pain for total injection of 1 mL Kenalog 40 mg/ML and 2 ml lidocaine 1% w/o epi.  Patient had relief after the injection without side effects.  Pt given signs of infection to watch for.   Pertinent previous records reviewed include none  Follow Up: 1 week for reevaluation.  Could consider OMT if pain level has decreased   Subjective:   I, Moenique Parris, am serving as a Neurosurgeon for Doctor Richardean Sale  Chief Complaint: neck pain   HPI:   01/03/23 [name] with neck pain.patient states she can't move her head.  Pain states yesterday morning. She rolled to turn her alarm clock off  , felt like a charley horse in her neck that never went away. Upper trap and neck pain. Decreased ROM. Tizanidine , ibu, heat and ice have helped a little but not enough. No numbness or tingling   Relevant Historical Information: None pertinent  Additional pertinent review of systems negative.   Current Outpatient Medications:    Adapalene 0.3 % gel, Apply 1 application topically at bedtime., Disp: 45 g, Rfl: 5   buPROPion (WELLBUTRIN XL) 150 MG 24 hr tablet, Take 1 tablet (150 mg total) by mouth daily. For total of 450 mg daily, Disp: 90 tablet, Rfl: 1   buPROPion (WELLBUTRIN XL) 300 MG 24 hr tablet, Take 1 tablet (300 mg total) by mouth daily., Disp: 90 tablet, Rfl: 3   busPIRone (BUSPAR) 5 MG tablet, TAKE 1 TABLET BY MOUTH TWICE A DAY, Disp: 180 tablet, Rfl: 3   cyclobenzaprine (FLEXERIL) 5 MG tablet, Take 1 tablet (5 mg total) by mouth daily as needed for muscle spasms., Disp: 40 tablet, Rfl: 0   drospirenone-ethinyl estradiol (YAZ,GIANVI,LORYNA) 3-0.02 MG tablet, Take 1 tablet by mouth daily., Disp: , Rfl:    escitalopram (LEXAPRO) 10 MG tablet, TAKE 1 TABLET BY MOUTH EVERY DAY, Disp: 90 tablet, Rfl: 1   lisdexamfetamine (VYVANSE) 40 MG capsule, Take 1 capsule (40 mg total) by mouth every morning., Disp: 30 capsule, Rfl: 0   meloxicam (MOBIC) 15 MG tablet, Take 1 tablet (15 mg total) by mouth daily., Disp: 30 tablet, Rfl: 0   meloxicam (MOBIC) 15 MG tablet,  Take 1 tablet (15 mg total) by mouth daily., Disp: 30 tablet, Rfl: 0   spironolactone (ALDACTONE) 50 MG tablet, Take 50 mg by mouth daily., Disp: , Rfl:    tiZANidine (ZANAFLEX) 4 MG tablet, TAKE 1 TABLET BY MOUTH EVERYDAY AT BEDTIME, Disp: 90 tablet, Rfl: 1  Current Facility-Administered Medications:    ketorolac (TORADOL) injection 60 mg, 60 mg, Intramuscular, Once, Richardean Sale, DO   methylPREDNISolone acetate (DEPO-MEDROL) injection 80 mg, 80 mg, Intramuscular, Once, Richardean Sale, DO   Objective:     Vitals:    01/03/23 0859  BP: 126/82  Pulse: 86  SpO2: 98%  Weight: 210 lb (95.3 kg)  Height: 5\' 6"  (1.676 m)      Body mass index is 33.89 kg/m.    Physical Exam:    Neck Exam: Cervical Spine- Posture normal Skin- normal, intact  Neuro:  Strength-  Right Left   Deltoid (C5) 5/5 5/5  Bicep/Brachioradialis (C5/6) 5/5  5/5  Wrist Extension (C6) 5/5 5/5  Tricep (C7) 5/5 5/5  Wrist Flexion (C7) 5/5 5/5  Grip (C8) 5/5 5/5  Finger Abduction (T1) 5/5 5/5   Sensation: intact to light touch in upper extremities bilaterally  Spurling's:  negative bilaterally Neck ROM: Limited in all directions due to left neck and trapezius pain TTP: TTP left cervical paraspinal, trapezius, levator NTTP: cervical spinous processes, right cervical paraspinal, thoracic paraspinal, right trapezius    Electronically signed by:  Aleen Sells [name] 9:44 AM 01/03/23

## 2023-01-09 NOTE — Progress Notes (Signed)
Diana King [name] [address] [place] [address] Phone: [phone]   Assessment and Plan:     1. Strain of left trapezius muscle, subsequent encounter 2. Somatic dysfunction of cervical region 3. Somatic dysfunction of thoracic region 4. Somatic dysfunction of rib region  -Acute, improving, subsequent visit - Overall moderate improvement in strain of left trapezius the patient still experiencing intermittent tightness and soreness, though not as severe as prior office visit last week - Continue HEP targeting cervical spine paraspinals, trapezius - Continue meloxicam 15 mg daily for another 1 to 2 weeks - May continue to use Flexeril 5 to 10 mg nightly as needed for muscle spasms - Patient has received relief with OMT in the past.  Elects for repeat OMT today.  Tolerated well per note below. - Decision today to treat with OMT was based on Physical Exam  After verbal consent patient was treated with HVLA (high velocity low amplitude), ME (muscle energy), FPR (flex positional release), ST (soft tissue), techniques in cervical, rib, thoracic,   areas. Patient tolerated the procedure well with improvement in symptoms.  Patient educated on potential side effects of soreness and recommended to rest, hydrate, and use Tylenol as needed for pain control.  Pertinent previous records reviewed include none  Follow Up: As needed.  Could consider repeat OMT versus trigger point injections versus physical therapy   Subjective:   I, Diana King, am serving as a Neurosurgeon for Doctor Richardean Sale   Chief Complaint: neck pain    HPI:    01/03/23 [name] with neck pain.patient states she can't move her head.  Pain states yesterday morning. She rolled to turn her alarm clock off , felt like a charley horse in her neck that never went away. Upper trap and neck pain. Decreased ROM. Tizanidine , ibu, heat and ice have helped  a little but not enough. No numbness or tingling   01/10/2023 Patient states that she is a lot better. Some soreness in her upper traps     Relevant Historical Information: None pertinent    Additional pertinent review of systems negative.   Current Outpatient Medications:    Adapalene 0.3 % gel, Apply 1 application topically at bedtime., Disp: 45 g, Rfl: 5   buPROPion (WELLBUTRIN XL) 150 MG 24 hr tablet, Take 1 tablet (150 mg total) by mouth daily. For total of 450 mg daily, Disp: 90 tablet, Rfl: 1   buPROPion (WELLBUTRIN XL) 300 MG 24 hr tablet, Take 1 tablet (300 mg total) by mouth daily., Disp: 90 tablet, Rfl: 3   busPIRone (BUSPAR) 5 MG tablet, TAKE 1 TABLET BY MOUTH TWICE A DAY, Disp: 180 tablet, Rfl: 3   cyclobenzaprine (FLEXERIL) 5 MG tablet, Take 1 tablet (5 mg total) by mouth daily as needed for muscle spasms., Disp: 40 tablet, Rfl: 0   drospirenone-ethinyl estradiol (YAZ,GIANVI,LORYNA) 3-0.02 MG tablet, Take 1 tablet by mouth daily., Disp: , Rfl:    escitalopram (LEXAPRO) 10 MG tablet, TAKE 1 TABLET BY MOUTH EVERY DAY, Disp: 90 tablet, Rfl: 1   lisdexamfetamine (VYVANSE) 40 MG capsule, Take 1 capsule (40 mg total) by mouth every morning., Disp: 30 capsule, Rfl: 0   meloxicam (MOBIC) 15 MG tablet, Take 1 tablet (15 mg total) by mouth daily., Disp: 30 tablet, Rfl: 0   meloxicam (MOBIC) 15 MG tablet, Take 1 tablet (15 mg total) by mouth daily., Disp: 30 tablet, Rfl: 0  spironolactone (ALDACTONE) 50 MG tablet, Take 50 mg by mouth daily., Disp: , Rfl:    tiZANidine (ZANAFLEX) 4 MG tablet, TAKE 1 TABLET BY MOUTH EVERYDAY AT BEDTIME, Disp: 90 tablet, Rfl: 1   Objective:     Vitals:   01/10/23 1005  BP: 134/80  Pulse: 88  SpO2: 97%  Weight: 204 lb (92.5 kg)  Height: 5\' 6"  (1.676 m)      Body mass index is 32.93 kg/m.    Physical Exam:    General: Well-appearing, cooperative, sitting comfortably in no acute distress.   OMT Physical Exam:   Cervical: TTP paraspinal, C3  RLSR Rib: Bilateral elevated first rib with TTP, worse on right Thoracic: TTP paraspinal, T4-6 RLSR.  TTP along left trapezius   Electronically signed by:  Diana [name] 11:47 AM 01/10/23

## 2023-01-10 ENCOUNTER — Ambulatory Visit: Payer: BC Managed Care – PPO | Admitting: Sports Medicine

## 2023-01-10 VITALS — BP 134/80 | HR 88 | Ht 66.0 in | Wt 204.0 lb

## 2023-01-10 DIAGNOSIS — M9901 Segmental and somatic dysfunction of cervical region: Secondary | ICD-10-CM | POA: Diagnosis not present

## 2023-01-10 DIAGNOSIS — S46812D Strain of other muscles, fascia and tendons at shoulder and upper arm level, left arm, subsequent encounter: Secondary | ICD-10-CM | POA: Diagnosis not present

## 2023-01-10 DIAGNOSIS — M9908 Segmental and somatic dysfunction of rib cage: Secondary | ICD-10-CM | POA: Diagnosis not present

## 2023-01-10 DIAGNOSIS — M9902 Segmental and somatic dysfunction of thoracic region: Secondary | ICD-10-CM

## 2023-01-16 ENCOUNTER — Other Ambulatory Visit: Payer: Self-pay | Admitting: Internal Medicine
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
## 2023-02-03 ENCOUNTER — Ambulatory Visit: Payer: BC Managed Care – PPO | Admitting: Behavioral Health

## 2023-02-06 ENCOUNTER — Ambulatory Visit: Payer: BC Managed Care – PPO | Admitting: Sports Medicine

## 2023-02-06 VITALS — BP 122/82 | HR 82 | Ht 66.0 in | Wt 202.0 lb

## 2023-02-06 DIAGNOSIS — M545 Low back pain, unspecified: Secondary | ICD-10-CM

## 2023-02-06 DIAGNOSIS — M9901 Segmental and somatic dysfunction of cervical region: Secondary | ICD-10-CM

## 2023-02-06 DIAGNOSIS — M9908 Segmental and somatic dysfunction of rib cage: Secondary | ICD-10-CM

## 2023-02-06 DIAGNOSIS — G8929 Other chronic pain: Secondary | ICD-10-CM

## 2023-02-06 DIAGNOSIS — M9902 Segmental and somatic dysfunction of thoracic region: Secondary | ICD-10-CM

## 2023-02-06 DIAGNOSIS — M9903 Segmental and somatic dysfunction of lumbar region: Secondary | ICD-10-CM

## 2023-02-06 DIAGNOSIS — M9905 Segmental and somatic dysfunction of pelvic region: Secondary | ICD-10-CM

## 2023-02-06 NOTE — Progress Notes (Signed)
Diana King [facility] [address] [place] [address] Phone: [phone]   Assessment and Plan:     1. Chronic bilateral low back pain without sciatica 2. Somatic dysfunction of cervical region 3. Somatic dysfunction of thoracic region 4. Somatic dysfunction of lumbar region 5. Somatic dysfunction of pelvic region 6. Somatic dysfunction of rib region - Chronic with exacerbation, subsequent visit - Recurrent flare of primarily lower back pain, but additionally middle back and hip pain likely flared due to inactivity lying in bed while recovering from URI - May continue meloxicam 15 mg daily as needed for pain relief.  Recommend limiting chronic NSAIDs to 1-2 doses per week - May continue Flexeril 5 mg nightly as needed for muscle spasms - Patient has received relief with OMT in the past.  Elects for repeat OMT today.  Tolerated well per note below. - Decision today to treat with OMT was based on Physical Exam   After verbal consent patient was treated with HVLA (high velocity low amplitude), ME (muscle energy), FPR (flex positional release), ST (soft tissue), PC/PD (Pelvic Compression/ Pelvic Decompression) techniques in cervical, rib, thoracic, lumbar, and pelvic areas. Patient tolerated the procedure well with improvement in symptoms.  Patient educated on potential side effects of soreness and recommended to rest, hydrate, and use Tylenol as needed for pain control.   Pertinent previous records reviewed include none  Follow Up: As needed   Subjective:   I, Diana King, am serving as a Neurosurgeon for Doctor Diana King   Chief Complaint: neck pain    HPI:    01/03/23 Patient is a 33 year old female with neck pain.patient states she can't move her head.  Pain states yesterday morning. She rolled to turn her alarm clock off , felt like a charley horse in her neck that never went away. Upper trap and neck pain. Decreased ROM. Tizanidine , ibu,  heat and ice have helped a little but not enough. No numbness or tingling   01/10/2023 Patient states that she is a lot better. Some soreness in her upper traps   02/06/2023 Patient states mid lower back tightness     Relevant Historical Information: None pertinent  Additional pertinent review of systems negative.  Current Outpatient Medications  Medication Sig Dispense Refill   Adapalene 0.3 % gel Apply 1 application topically at bedtime. 45 g 5   buPROPion (WELLBUTRIN XL) 150 MG 24 hr tablet Take 1 tablet (150 mg total) by mouth daily. For total of 450 mg daily 90 tablet 1   buPROPion (WELLBUTRIN XL) 300 MG 24 hr tablet Take 1 tablet (300 mg total) by mouth daily. 90 tablet 3   busPIRone (BUSPAR) 5 MG tablet TAKE 1 TABLET BY MOUTH TWICE A DAY 180 tablet 3   cyclobenzaprine (FLEXERIL) 5 MG tablet Take 1 tablet (5 mg total) by mouth daily as needed for muscle spasms. 40 tablet 0   drospirenone-ethinyl estradiol (YAZ,GIANVI,LORYNA) 3-0.02 MG tablet Take 1 tablet by mouth daily.     escitalopram (LEXAPRO) 10 MG tablet TAKE 1 TABLET BY MOUTH EVERY DAY 90 tablet 0   lisdexamfetamine (VYVANSE) 40 MG capsule Take 1 capsule (40 mg total) by mouth every morning. 30 capsule 0   meloxicam (MOBIC) 15 MG tablet Take 1 tablet (15 mg total) by mouth daily. 30 tablet 0   spironolactone (ALDACTONE) 50 MG tablet Take 50 mg by mouth daily.     tiZANidine (ZANAFLEX) 4 MG tablet TAKE 1 TABLET  BY MOUTH EVERYDAY AT BEDTIME 90 tablet 1   No current facility-administered medications for this visit.      Objective:     Vitals:   02/06/23 0938  BP: 122/82  Pulse: 82  SpO2: 100%  Weight: 202 lb (91.6 kg)  Height: 5\' 6"  (1.676 m)      Body mass index is 32.6 kg/m.    Physical Exam:     General: Well-appearing, cooperative, sitting comfortably in no acute distress.   OMT Physical Exam:  ASIS Compression Test: Positive Right Cervical: TTP paraspinal, C3 RRSL Rib: Bilateral elevated first rib with  NTTP Thoracic: TTP paraspinal, T4-6 RRSL, T7-9 RLSR Lumbar: TTP paraspinal, L2 RLSL Pelvis: Right anterior innominate  Electronically signed by:  Diana King [facility] 10:15 AM 02/06/23

## 2023-02-17 ENCOUNTER — Ambulatory Visit: Payer: BC Managed Care – PPO | Admitting: Behavioral Health

## 2023-02-17 NOTE — Progress Notes (Unsigned)
Deneise Lever, LMFT

## 2023-03-24 ENCOUNTER — Ambulatory Visit: Admitting: Sports Medicine

## 2023-03-24 ENCOUNTER — Ambulatory Visit (INDEPENDENT_AMBULATORY_CARE_PROVIDER_SITE_OTHER)

## 2023-03-24 VITALS — BP 126/82 | HR 93 | Ht 66.0 in | Wt 200.0 lb

## 2023-03-24 DIAGNOSIS — M79671 Pain in right foot: Secondary | ICD-10-CM

## 2023-03-24 NOTE — Progress Notes (Signed)
 Aleen Sells [name] [address] [place] [address] Phone: [phone]   Assessment and Plan:     1. Right foot pain -Chronic with exacerbation, subsequent visit - Recurrent right foot pain, with current flare x 1 day and more medial than typical midfoot pain.  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  Likely stressed due to walking in a grocery store - X-ray obtained in clinic.  My interpretation: No acute fracture or dislocation.  Accessory navicular bone present.  Os trigonum present. - Recommend cushioned and supportive tennis shoes with an arch support to decrease strain on arch and navicular bone.  Recommend [employer] or other similar store for inserts - May use topical Voltaren gel over areas of pain - Start HEP for intrinsic foot exercises - May use meloxicam 15 mg daily as needed for breakthrough pain.  Patient has prescription and does not need refill.  15 additional minutes spent for educating Therapeutic Home Exercise Program.  This included exercises focusing on stretching, strengthening, with focus on eccentric aspects.   Long term goals include an improvement in range of motion, strength, endurance as well as avoiding reinjury. Patient's frequency would include in 1-2 times a day, 3-5 times a week for a duration of 6-12 weeks. Proper technique shown and discussed handout in great detail with ATC.  All questions were discussed and answered.     Pertinent previous records reviewed include none  Follow Up: As improvement or worsening of symptoms.  Could consider ultrasound versus physical therapy   Subjective:   I, Moenique Parris, am serving as a Neurosurgeon for Doctor Richardean Sale  Chief Complaint: right foot pain   HPI:   06/25/2022 Diana King is a 33 y.o. female coming in with complaint of right foot pain.  Started having worsening symptoms again.  Did  have a drop cuboid and did need down manipulation from May 22.  Since then has had this intermittent discomfort and pain again.   03/24/23 Patient states she was walking and a sharp pain hit her cuboid area and she jst wants some peace of mind. She was just walking and felt the pain    Relevant Historical Information: None pertinent  Additional pertinent review of systems negative.   Current Outpatient Medications:    Adapalene 0.3 % gel, Apply 1 application topically at bedtime., Disp: 45 g, Rfl: 5   buPROPion (WELLBUTRIN XL) 150 MG 24 hr tablet, Take 1 tablet (150 mg total) by mouth daily. For total of 450 mg daily, Disp: 90 tablet, Rfl: 1   buPROPion (WELLBUTRIN XL) 300 MG 24 hr tablet, Take 1 tablet (300 mg total) by mouth daily., Disp: 90 tablet, Rfl: 3   busPIRone (BUSPAR) 5 MG tablet, TAKE 1 TABLET BY MOUTH TWICE A DAY, Disp: 180 tablet, Rfl: 3   cyclobenzaprine (FLEXERIL) 5 MG tablet, Take 1 tablet (5 mg total) by mouth daily as needed for muscle spasms., Disp: 40 tablet, Rfl: 0   drospirenone-ethinyl estradiol (YAZ,GIANVI,LORYNA) 3-0.02 MG tablet, Take 1 tablet by mouth daily., Disp: , Rfl:    escitalopram (LEXAPRO) 10 MG tablet, TAKE 1 TABLET BY MOUTH EVERY DAY, Disp: 90 tablet, Rfl: 0   lisdexamfetamine (VYVANSE) 40 MG capsule, Take 1 capsule (40 mg total) by mouth every morning., Disp: 30 capsule, Rfl: 0   meloxicam (MOBIC) 15 MG tablet, Take 1 tablet (15 mg total) by mouth daily., Disp: 30  tablet, Rfl: 0   spironolactone (ALDACTONE) 50 MG tablet, Take 50 mg by mouth daily., Disp: , Rfl:    tiZANidine (ZANAFLEX) 4 MG tablet, TAKE 1 TABLET BY MOUTH EVERYDAY AT BEDTIME, Disp: 90 tablet, Rfl: 1   Objective:     Vitals:   03/24/23 0815  BP: 126/82  Pulse: 93  SpO2: 97%  Weight: 200 lb (90.7 kg)  Height: 5\' 6"  (1.676 m)      Body mass index is 32.28 kg/m.    Physical Exam:    Gen: Appears well, nad, nontoxic and pleasant Psych: Alert and oriented, appropriate mood and  affect Neuro: sensation intact, strength is 5/5 with df/pf/inv/ev, muscle tone wnl Skin: no susupicious lesions or rashes  Right foot/ankle:  No deformity, no swelling or effusion TTP arch, medial navicular NTTP over fibular head, lat mal, medial mal, achilles,  , base of 5th, ATFL, CFL, deltoid, calcaneous or midfoot ROM DF 30, PF 45, inv/ev intact Negative ant drawer, talar tilt, rotation test, squeeze test. Neg thompson No pain with resisted inversion or eversion    Electronically signed by:  Aleen Sells [name] 8:34 AM 03/24/23

## 2023-03-24 NOTE — Patient Instructions (Signed)
 Foot HEP  Can use meloxicam as needed Recommend wearing cushioned tennis shoes with inserts from [employer] or similar  As needed follow up

## 2023-03-25 ENCOUNTER — Ambulatory Visit: Payer: BC Managed Care – PPO | Admitting: Behavioral Health

## 2023-03-25 DIAGNOSIS — F32A Depression, unspecified: Secondary | ICD-10-CM

## 2023-03-25 DIAGNOSIS — F419 Anxiety disorder, unspecified: Secondary | ICD-10-CM
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
## 2023-03-25 NOTE — Progress Notes (Signed)
   Deneise Lever, LMFT

## 2023-04-03 ENCOUNTER — Encounter: Payer: Self-pay | Admitting: Internal Medicine

## 2023-04-03 NOTE — Progress Notes (Unsigned)
 Subjective:    Patient ID: Diana King, female    DOB: Dec 02, 1990, 33 y.o.   MRN: [id number]      HPI Roanne is here for a Physical exam and her chronic medical problems.      Medications and allergies reviewed with patient and updated if appropriate.  Current Outpatient Medications on File Prior to Visit  Medication Sig Dispense Refill   Adapalene 0.3 % gel Apply 1 application topically at bedtime. 45 g 5   buPROPion (WELLBUTRIN XL) 300 MG 24 hr tablet Take 1 tablet (300 mg total) by mouth daily. 90 tablet 3   busPIRone (BUSPAR) 5 MG tablet TAKE 1 TABLET BY MOUTH TWICE A DAY 180 tablet 3   cyclobenzaprine (FLEXERIL) 5 MG tablet Take 1 tablet (5 mg total) by mouth daily as needed for muscle spasms. 40 tablet 0   drospirenone-ethinyl estradiol (YAZ,GIANVI,LORYNA) 3-0.02 MG tablet Take 1 tablet by mouth daily.     escitalopram (LEXAPRO) 10 MG tablet TAKE 1 TABLET BY MOUTH EVERY DAY 90 tablet 0   meloxicam (MOBIC) 15 MG tablet Take 1 tablet (15 mg total) by mouth daily. 30 tablet 0   spironolactone (ALDACTONE) 50 MG tablet Take 50 mg by mouth daily.     tiZANidine (ZANAFLEX) 4 MG tablet TAKE 1 TABLET BY MOUTH EVERYDAY AT BEDTIME 90 tablet 1   buPROPion (WELLBUTRIN XL) 150 MG 24 hr tablet Take 1 tablet (150 mg total) by mouth daily. For total of 450 mg daily (Patient not taking: Reported on 04/04/2023) 90 tablet 1   No current facility-administered medications on file prior to visit.    Review of Systems  Constitutional:  Negative for fever.  Eyes:  Negative for visual disturbance.  Respiratory:  Negative for cough, shortness of breath and wheezing.   Cardiovascular:  Negative for chest pain, palpitations and leg swelling.  Gastrointestinal:  Negative for abdominal pain, blood in stool, constipation and diarrhea.       No gerd  Genitourinary:  Negative for dysuria.  Musculoskeletal:  Positive for back pain. Negative for arthralgias.  Skin:  Negative for rash.   Neurological:  Negative for light-headedness and headaches.  Psychiatric/Behavioral:  Positive for dysphoric mood. The patient is nervous/anxious.        Objective:   Vitals:   04/04/23 0748  BP: 124/78  Pulse: 76  SpO2: 98%   Filed Weights   04/04/23 0748  Weight: 201 lb (91.2 kg)   Body mass index is 32.44 kg/m.  BP Readings from Last 3 Encounters:  04/04/23 124/78  03/24/23 126/82  02/06/23 122/82    Wt Readings from Last 3 Encounters:  04/04/23 201 lb (91.2 kg)  03/24/23 200 lb (90.7 kg)  02/06/23 202 lb (91.6 kg)       Physical Exam Constitutional: She appears well-developed and well-nourished. No distress.  HENT:  Head: Normocephalic and atraumatic.  Right Ear: External ear normal. Normal ear canal and TM Left Ear: External ear normal.  Normal ear canal and TM Mouth/Throat: Oropharynx is clear and moist.  Eyes: Conjunctivae normal.  Neck: Neck supple. No tracheal deviation present. No thyromegaly present.  No carotid bruit  Cardiovascular: Normal rate, regular rhythm and normal heart sounds.   No murmur heard.  No edema. Pulmonary/Chest: Effort normal and breath sounds normal. No respiratory distress. She has no wheezes. She has no rales.  Breast: deferred   Abdominal: Soft. She exhibits no distension. There is no tenderness.  Lymphadenopathy: She has no cervical  adenopathy.  Skin: Skin is warm and dry. She is not diaphoretic.  Psychiatric: She has a normal mood and affect. Her behavior is normal.     Lab Results  Component Value Date   WBC 8.1 04/03/2022   HGB 14.6 04/03/2022   HCT 44.4 04/03/2022   PLT 421.0 (H) 04/03/2022   GLUCOSE 92 04/03/2022   CHOL 230 (H) 01/31/2022   TRIG 84 01/31/2022   HDL 66 01/31/2022   LDLCALC 149 (H) 01/31/2022   ALT 13 04/03/2022   AST 17 04/03/2022   NA 135 04/03/2022   K 4.5 04/03/2022   CL 101 04/03/2022   CREATININE 0.98 04/03/2022   BUN 19 04/03/2022   CO2 24 04/03/2022   TSH 2.42 04/03/2022    HGBA1C 5.7 (H) 01/31/2022         Assessment & Plan:   Physical exam: Screening blood work  ordered Exercise  regular Weight  obese - working on weight loss Substance abuse  none   Reviewed recommended immunizations.   Health Maintenance  Topic Date Due   Cervical Cancer Screening (HPV/Pap Cotest)  Never done   COVID-19 Vaccine (4 - 2024-25 season) 09/22/2022   INFLUENZA VACCINE  04/21/2023 (Originally 08/22/2022)   DTaP/Tdap/Td (6 - Td or Tdap) 08/23/2023   Hepatitis C Screening  Completed   HIV Screening  Completed   HPV VACCINES  Aged Out          See Problem List for Assessment and Plan of chronic medical problems.

## 2023-04-03 NOTE — Patient Instructions (Addendum)

## 2023-04-04 ENCOUNTER — Ambulatory Visit: Payer: BC Managed Care – PPO | Admitting: Internal Medicine

## 2023-04-04 VITALS — BP 124/78 | HR 76 | Ht 66.0 in | Wt 201.0 lb

## 2023-04-04 DIAGNOSIS — F419 Anxiety disorder, unspecified: Secondary | ICD-10-CM | POA: Diagnosis not present

## 2023-04-04 DIAGNOSIS — F32A Depression, unspecified: Secondary | ICD-10-CM

## 2023-04-04 DIAGNOSIS — E559 Vitamin D deficiency, unspecified: Secondary | ICD-10-CM

## 2023-04-04 DIAGNOSIS — R7303 Prediabetes: Secondary | ICD-10-CM

## 2023-04-04 DIAGNOSIS — Z0000 Encounter for general adult medical examination without abnormal findings: Secondary | ICD-10-CM | POA: Diagnosis not present

## 2023-04-04 DIAGNOSIS — E66811 Obesity, class 1: Secondary | ICD-10-CM

## 2023-04-04 LAB — CBC WITH DIFFERENTIAL/PLATELET
Basophils Absolute: 0.1 10*3/uL (ref 0.0–0.1)
Basophils Relative: 1.1 % (ref 0.0–3.0)
Eosinophils Absolute: 0.1 10*3/uL (ref 0.0–0.7)
Eosinophils Relative: 0.8 % (ref 0.0–5.0)
HCT: 42.4 % (ref 36.0–46.0)
Hemoglobin: 13.8 g/dL (ref 12.0–15.0)
Lymphocytes Relative: 31.4 % (ref 12.0–46.0)
Lymphs Abs: 3.1 10*3/uL (ref 0.7–4.0)
MCHC: 32.5 g/dL (ref 30.0–36.0)
MCV: 85.7 fl (ref 78.0–100.0)
Monocytes Absolute: 0.9 10*3/uL (ref 0.1–1.0)
Monocytes Relative: 9 % (ref 3.0–12.0)
Neutro Abs: 5.8 10*3/uL (ref 1.4–7.7)
Neutrophils Relative %: 57.7 % (ref 43.0–77.0)
Platelets: 495 10*3/uL — ABNORMAL HIGH (ref 150.0–400.0)
RBC: 4.95 Mil/uL (ref 3.87–5.11)
RDW: 13.3 % (ref 11.5–15.5)
WBC: 10 10*3/uL (ref 4.0–10.5)

## 2023-04-04 LAB — COMPREHENSIVE METABOLIC PANEL
ALT: 19 U/L (ref 0–35)
AST: 19 U/L (ref 0–37)
Albumin: 4.2 g/dL (ref 3.5–5.2)
Alkaline Phosphatase: 53 U/L (ref 39–117)
BUN: 11 mg/dL (ref 6–23)
CO2: 25 meq/L (ref 19–32)
Calcium: 9.4 mg/dL (ref 8.4–10.5)
Chloride: 104 meq/L (ref 96–112)
Creatinine, Ser: 0.94 mg/dL (ref 0.40–1.20)
GFR: 78.86 mL/min (ref >60.00)
Glucose, Bld: 85 mg/dL (ref 70–99)
Potassium: 4.1 meq/L (ref 3.5–5.1)
Sodium: 140 meq/L (ref 135–145)
Total Bilirubin: 0.2 mg/dL (ref 0.2–1.2)
Total Protein: 7.5 g/dL (ref 6.0–8.3)

## 2023-04-04 LAB — LIPID PANEL
Cholesterol: 206 mg/dL — ABNORMAL HIGH (ref 0–200)
HDL: 44.1 mg/dL (ref >39.00)
LDL Cholesterol: 138 mg/dL — ABNORMAL HIGH (ref 0–99)
NonHDL: 161.72
Total CHOL/HDL Ratio: 5
Triglycerides: 117 mg/dL (ref 0.0–149.0)
VLDL: 23.4 mg/dL (ref 0.0–40.0)

## 2023-04-04 LAB — HEMOGLOBIN A1C: Hgb A1c MFr Bld: 5.6 % (ref 4.6–6.5)

## 2023-04-04 LAB — VITAMIN D 25 HYDROXY (VIT D DEFICIENCY, FRACTURES): VITD: 51.08 ng/mL (ref 30.00–100.00)

## 2023-04-04 LAB — TSH: TSH: 1.62 u[IU]/mL (ref 0.35–5.50)

## 2023-04-04 MED ORDER — ESCITALOPRAM OXALATE 20 MG PO TABS
20.0000 mg | ORAL_TABLET | Freq: Every day | ORAL | 1 refills | Status: DC
Start: 2023-04-04 — End: 2023-10-16

## 2023-04-04 MED ORDER — BUPROPION HCL ER (XL) 300 MG PO TB24: 300.0000 mg | ORAL_TABLET | Freq: Every day | ORAL | 3 refills | Status: AC

## 2023-04-04 NOTE — Assessment & Plan Note (Addendum)
 Chronic Not ideally controlled Continue Lexapro but increase to 20 mg daily Continue bupropion XL 300 mg daily ( did not tolerate 450 mg) Continue buspirone 5 mg twice daily as needed

## 2023-04-04 NOTE — Assessment & Plan Note (Signed)
 Chronic Taking vitamin D daily Check vitamin D level

## 2023-04-04 NOTE — Assessment & Plan Note (Signed)
 Chronic Started working out again Taking compounded mounjaro Working on eating more healthy

## 2023-04-04 NOTE — Assessment & Plan Note (Signed)
Lab Results  Component Value Date   HGBA1C 5.7 (H) 01/31/2022   Working on weight loss Low sugar/carbohydrate diet, regular exercise

## 2023-04-05 ENCOUNTER — Encounter: Payer: Self-pay | Admitting: Internal Medicine

## 2023-04-14 ENCOUNTER — Encounter: Payer: Self-pay | Admitting: Sports Medicine

## 2023-04-15 ENCOUNTER — Ambulatory Visit: Admitting: Behavioral Health

## 2023-04-16 ENCOUNTER — Other Ambulatory Visit: Payer: Self-pay | Admitting: Family Medicine

## 2023-04-17 ENCOUNTER — Other Ambulatory Visit: Payer: Self-pay | Admitting: Internal Medicine

## 2023-04-21 ENCOUNTER — Ambulatory Visit (INDEPENDENT_AMBULATORY_CARE_PROVIDER_SITE_OTHER): Admitting: Sports Medicine

## 2023-04-21 VITALS — BP 122/84 | HR 97 | Ht 66.0 in | Wt 200.0 lb

## 2023-04-21 DIAGNOSIS — M9905 Segmental and somatic dysfunction of pelvic region: Secondary | ICD-10-CM | POA: Diagnosis not present

## 2023-04-21 DIAGNOSIS — M546 Pain in thoracic spine: Secondary | ICD-10-CM

## 2023-04-21 DIAGNOSIS — M9902 Segmental and somatic dysfunction of thoracic region: Secondary | ICD-10-CM

## 2023-04-21 DIAGNOSIS — M9908 Segmental and somatic dysfunction of rib cage: Secondary | ICD-10-CM

## 2023-04-21 DIAGNOSIS — M9903 Segmental and somatic dysfunction of lumbar region: Secondary | ICD-10-CM | POA: Diagnosis not present

## 2023-04-21 DIAGNOSIS — M9901 Segmental and somatic dysfunction of cervical region: Secondary | ICD-10-CM | POA: Diagnosis not present

## 2023-04-21 DIAGNOSIS — G8929 Other chronic pain: Secondary | ICD-10-CM

## 2023-04-21 NOTE — Progress Notes (Signed)
 Aleen Sells [name] [address] [place] [address] Phone: [phone]   Assessment and Plan:     1. Chronic bilateral thoracic back pain 2. Somatic dysfunction of cervical region 3. Somatic dysfunction of thoracic region 4. Somatic dysfunction of lumbar region 5. Somatic dysfunction of pelvic region 6. Somatic dysfunction of rib region  -Chronic with exacerbation, subsequent visit - Recurrence and primarily upper back pain.  Multiple musculoskeletal pains have overall had benefit with OMT, HEP, intermittent use of meloxicam 15 mg daily as needed - Continue meloxicam 15 mg daily as needed.  Recommend limiting chronic NSAIDs 1-2 doses per week.  Can ask for meloxicam refill if needed - Patient has received relief with OMT in the past.  Elects for repeat OMT today.  Tolerated well per note below. - Decision today to treat with OMT was based on Physical Exam   After verbal consent patient was treated with HVLA (high velocity low amplitude), ME (muscle energy), FPR (flex positional release), ST (soft tissue), PC/PD (Pelvic Compression/ Pelvic Decompression) techniques in cervical, rib, thoracic, lumbar, and pelvic areas. Patient tolerated the procedure well with improvement in symptoms.  Patient educated on potential side effects of soreness and recommended to rest, hydrate, and use Tylenol as needed for pain control.   Pertinent previous records reviewed include none  Follow Up: As needed for repeat evaluation.  Could consider repeat OMT   Subjective:   I, Moenique Parris, am serving as a Neurosurgeon for Doctor Richardean Sale   Chief Complaint: neck pain    HPI:    01/03/23 Patient is a 33 year old female with neck pain.patient states she can't move her head.  Pain states yesterday morning. She rolled to turn her alarm clock off , felt like a charley horse in her neck that never went away. Upper trap and neck pain. Decreased ROM. Tizanidine , ibu, heat  and ice have helped a little but not enough. No numbness or tingling   01/10/2023 Patient states that she is a lot better. Some soreness in her upper traps    02/06/2023 Patient states mid lower back tightness   04/21/2023 Patient states upper back tightness     Relevant Historical Information: None pertinent  Additional pertinent review of systems negative.  Current Outpatient Medications  Medication Sig Dispense Refill   Adapalene 0.3 % gel Apply 1 application topically at bedtime. 45 g 5   buPROPion (WELLBUTRIN XL) 300 MG 24 hr tablet Take 1 tablet (300 mg total) by mouth daily. 90 tablet 3   busPIRone (BUSPAR) 5 MG tablet TAKE 1 TABLET BY MOUTH TWICE A DAY 180 tablet 3   cyclobenzaprine (FLEXERIL) 5 MG tablet Take 1 tablet (5 mg total) by mouth daily as needed for muscle spasms. 40 tablet 0   drospirenone-ethinyl estradiol (YAZ,GIANVI,LORYNA) 3-0.02 MG tablet Take 1 tablet by mouth daily.     escitalopram (LEXAPRO) 20 MG tablet Take 1 tablet (20 mg total) by mouth daily. 90 tablet 1   meloxicam (MOBIC) 15 MG tablet Take 1 tablet (15 mg total) by mouth daily. 30 tablet 0   spironolactone (ALDACTONE) 50 MG tablet Take 50 mg by mouth daily.     Tirzepatide [facility]) Inject into the skin.     tiZANidine (ZANAFLEX) 4 MG tablet TAKE 1 TABLET BY MOUTH EVERYDAY AT BEDTIME 90 tablet 1   No current facility-administered medications for this visit.      Objective:     Vitals:  04/21/23 1033  BP: 122/84  Pulse: 97  SpO2: 98%  Weight: 200 lb (90.7 kg)  Height: 5\' 6"  (1.676 m)      Body mass index is 32.28 kg/m.    Physical Exam:     General: Well-appearing, cooperative, sitting comfortably in no acute distress.   OMT Physical Exam:  ASIS Compression Test: Positive Right Cervical: TTP paraspinal, C3-5 RRSR Rib: Bilateral elevated first rib with TTP on left Thoracic: TTP paraspinal, T5 RLSL Lumbar: TTP paraspinal, L1-3 RRSL Pelvis: Right anterior  innominate  Electronically signed by:  Aleen Sells [name] 11:11 AM 04/21/23

## 2023-05-05 ENCOUNTER — Ambulatory Visit: Admitting: Behavioral Health

## 2023-05-05 DIAGNOSIS — F419 Anxiety disorder, unspecified: Secondary | ICD-10-CM | POA: Diagnosis not present

## 2023-05-05 DIAGNOSIS — F32A Depression, unspecified: Secondary | ICD-10-CM

## 2023-05-23 ENCOUNTER — Ambulatory Visit: Admitting: Family Medicine

## 2023-05-23 VITALS — BP 118/80 | HR 88 | Ht 66.0 in

## 2023-05-23 DIAGNOSIS — M25512 Pain in left shoulder: Secondary | ICD-10-CM | POA: Diagnosis not present

## 2023-05-23 NOTE — Assessment & Plan Note (Signed)
 Multifactorial, I do not know what it did cause any exacerbation.  Has responded well to osteopathic manipulation in the past.  Follow-up with me again as needed

## 2023-05-23 NOTE — Patient Instructions (Addendum)
 Good to see you. Return as needed.

## 2023-05-23 NOTE — Progress Notes (Signed)
  Diana King [address] [place] [address] Phone: [phone] Subjective:    I'm seeing this patient by the request  of:  Colene Dauphin, MD  CC: Back and neck pain follow-up  [id number]  Diana King is a 33 y.o. female coming in with complaint of back and neck pain Patient states that it is her upper back this time between the shoulder blades. It is really bad on the right side.   Medications patient has been prescribed: meloxicam , tizanidine   Taking: yes as needed         Reviewed prior external information including notes and imaging from previsou exam, outside providers and external EMR if available.   As well as notes that were available from care everywhere and other healthcare systems.  Past medical history, social, surgical and family history all reviewed in electronic medical record.  No pertanent information unless stated regarding to the chief complaint.   Past Medical History:  Diagnosis Date   Acne    Anxiety    Back pain    Depression    GERD (gastroesophageal reflux disease)    IBS (irritable bowel syndrome)    Overweight    Pain in Achilles tendon    Palpitations     Allergies  Allergen Reactions   Latex Swelling   Topamax  [Topiramate ] Other (See Comments)   Gabapentin       Review of Systems:  No headache, visual changes, nausea, vomiting, diarrhea, constipation, dizziness, abdominal pain, skin rash, fevers, chills, night sweats, weight loss, swollen lymph nodes, body aches, joint swelling, chest pain, shortness of breath, mood changes. POSITIVE muscle aches  Objective  Blood pressure 118/80, pulse 88, height 5\' 6"  (1.676 m), SpO2 98%.   General: No apparent distress alert and oriented x3 mood and affect normal, dressed appropriately.  HEENT: Pupils equal, extraocular movements intact  Respiratory: Patient's speak in full sentences and does not appear short of breath  Cardiovascular: No lower  extremity edema, non tender, no erythema  Gait MSK:  Back does have some loss lordosis.  Significant tightness noted in the left trapezius area.  Some limited sidebending of the neck to the right and rotation to the left.  Negative Spurling's.  Osteopathic findings  C2 flexed rotated and side bent right C6 flexed rotated and side bent left T3 extended rotated and side bent left inhaled rib T6 extended rotated and side bent left inhaled rib L2 flexed rotated and side bent right Sacrum right on right       Assessment and Plan:  No problem-specific Assessment & Plan notes found for this encounter.    Nonallopathic problems  Decision today to treat with OMT was based on Physical Exam  After verbal consent patient was treated with HVLA, ME, FPR techniques in cervical, rib, thoracic, lumbar, and sacral  areas  Patient tolerated the procedure well with improvement in symptoms  Patient given exercises, stretches and lifestyle modifications  See medications in patient instructions if given  Patient will follow up in 4-8 weeks     The above documentation has been reviewed and is accurate and complete Lawsen Arnott M Anwita Mencer, DO         Note: This dictation was prepared with Dragon dictation along with smaller phrase technology. Any transcriptional errors that result from this process are unintentional.

## 2023-05-27 ENCOUNTER — Ambulatory Visit: Admitting: Behavioral Health

## 2023-06-10 ENCOUNTER — Ambulatory Visit: Admitting: Behavioral Health

## 2023-06-13 NOTE — Progress Notes (Unsigned)
 [facility] [address] [place] [address] Phone: [phone] Subjective:   Diana King, am serving as a scribe for Dr. Ronnell Coins.  I'm seeing this patient by the request  of:  Colene Dauphin, MD  CC: right foot pain   [id number]  05/23/2023 Multifactorial, I do not know what it did cause any exacerbation.  Has responded well to osteopathic manipulation in the past.  Follow-up with me again as needed    OMT as well  Updated 06/18/2023 Diana King is a 33 y.o. female coming in with complaint of L shoulder pain. Patient feels like cuboid bone on R foot is subluxed.         Past Medical History:  Diagnosis Date   Acne    Anxiety    Back pain    Depression    GERD (gastroesophageal reflux disease)    IBS (irritable bowel syndrome)    Overweight    Pain in Achilles tendon    Palpitations    Past Surgical History:  Procedure Laterality Date   NO PAST SURGERIES     Social History   Socioeconomic History   Marital status: Married    Spouse name: Dustin   Number of children: 1   Years of education: Not on file   Highest education level: Associate degree: academic program  Occupational History   Occupation: CMA  Tobacco Use   Smoking status: Never   Smokeless tobacco: Never  Substance and Sexual Activity   Alcohol use: Yes    Comment: social   Drug use: Not on file   Sexual activity: Not on file  Other Topics Concern   Not on file  Social History Narrative   Not on file   Social Drivers of Health   Financial Resource Strain: Low Risk  (04/04/2023)   Overall Financial Resource Strain (CARDIA)    Difficulty of Paying Living Expenses: Not hard at all  Food Insecurity: No Food Insecurity (04/04/2023)   Hunger Vital Sign    Worried About Running Out of Food in the Last Year: Never true    Ran Out of Food in the Last Year: Never true  Transportation Needs: No Transportation Needs (04/04/2023)   PRAPARE -  Administrator, Civil Service (Medical): No    Lack of Transportation (Non-Medical): No  Physical Activity: Insufficiently Active (04/04/2023)   Exercise Vital Sign    Days of Exercise per Week: 2 days    Minutes of Exercise per Session: 50 min  Stress: No Stress Concern Present (04/04/2023)   [employer] of Occupational Health - Occupational Stress Questionnaire    Feeling of Stress : Not at all  Social Connections: Moderately Isolated (04/04/2023)   Social Connection and Isolation Panel [NHANES]    Frequency of Communication with Friends and Family: Never    Frequency of Social Gatherings with Friends and Family: Never    Attends Religious Services: Never    Database administrator or Organizations: Yes    Attends Engineer, structural: More than 4 times per year    Marital Status: Married   Allergies  Allergen Reactions   Latex Swelling   Topamax  [Topiramate ] Other (See Comments)   Gabapentin     Family History  Problem Relation Age of Onset   Fibromyalgia Mother    Interstitial cystitis Mother    Migraines Mother    Depression Mother    Anxiety disorder Mother    Alcohol  abuse Maternal Aunt    Mental illness Maternal Aunt    Alcohol abuse Maternal Uncle    Thyroid  disease Maternal Grandmother    Hyperlipidemia Father    Alcohol abuse Father     Current Outpatient Medications (Endocrine & Metabolic):    drospirenone-ethinyl estradiol (YAZ,GIANVI,LORYNA) 3-0.02 MG tablet, Take 1 tablet by mouth daily.   Tirzepatide  (MOUNJARO  [place]), Inject into the skin.  Current Outpatient Medications (Cardiovascular):    spironolactone (ALDACTONE) 50 MG tablet, Take 50 mg by mouth daily.   Current Outpatient Medications (Analgesics):    meloxicam  (MOBIC ) 15 MG tablet, Take 1 tablet (15 mg total) by mouth daily.   Current Outpatient Medications (Other):    Adapalene  0.3 % gel, Apply 1 application topically at bedtime.   buPROPion  (WELLBUTRIN  XL) 300 MG 24 hr  tablet, Take 1 tablet (300 mg total) by mouth daily.   busPIRone  (BUSPAR ) 5 MG tablet, TAKE 1 TABLET BY MOUTH TWICE A DAY   cyclobenzaprine  (FLEXERIL ) 5 MG tablet, Take 1 tablet (5 mg total) by mouth daily as needed for muscle spasms.   escitalopram  (LEXAPRO ) 20 MG tablet, Take 1 tablet (20 mg total) by mouth daily.   tiZANidine  (ZANAFLEX ) 4 MG tablet, TAKE 1 TABLET BY MOUTH EVERYDAY AT BEDTIME   Reviewed prior external information including notes and imaging from  primary care provider As well as notes that were available from care everywhere and other healthcare systems.  Past medical history, social, surgical and family history all reviewed in electronic medical record.  No pertanent information unless stated regarding to the chief complaint.   Review of Systems:  No headache, visual changes, nausea, vomiting, diarrhea, constipation, dizziness, abdominal pain, skin rash, fevers, chills, night sweats, weight loss, swollen lymph nodes, body aches, joint swelling, chest pain, shortness of breath, mood changes. POSITIVE muscle aches  Objective  Blood pressure 120/82, pulse 78, height 5\' 6"  (1.676 m), SpO2 98%.   General: No apparent distress alert and oriented x3 mood and affect normal, dressed appropriately.  HEENT: Pupils equal, extraocular movements intact  Respiratory: Patient's speak in full sentences and does not appear short of breath  Cardiovascular: No lower extremity edema, non tender, no erythema  Low back does have loss of lordosis tightness of faber  And tightness noted.  Patient is cuboid bone appears to have some tenderness but seems to be in place.    Impression and Recommendations:    The above documentation has been reviewed and is accurate and complete Diana Madura M Noriah Osgood, DO

## 2023-06-18 ENCOUNTER — Encounter: Payer: Self-pay | Admitting: Family Medicine

## 2023-06-18 ENCOUNTER — Other Ambulatory Visit: Payer: Self-pay

## 2023-06-18 ENCOUNTER — Ambulatory Visit: Admitting: Family Medicine

## 2023-06-18 VITALS — BP 120/82 | HR 78 | Ht 66.0 in

## 2023-06-18 DIAGNOSIS — M545 Low back pain, unspecified: Secondary | ICD-10-CM

## 2023-06-18 DIAGNOSIS — M25512 Pain in left shoulder: Secondary | ICD-10-CM

## 2023-06-18 NOTE — Assessment & Plan Note (Signed)
 Worsening discomfort from patient having exacerbation when she was out in the movement part.  Responded extremely well though to osteopathic manipulation today.  Hopeful that this will continue.  Discussed icing regimen and home exercises.  Increase activity slowly.  Follow-up again in 6 to 8 weeks

## 2023-07-08 ENCOUNTER — Ambulatory Visit: Admitting: Behavioral Health

## 2023-07-08 DIAGNOSIS — F419 Anxiety disorder, unspecified: Secondary | ICD-10-CM

## 2023-07-08 DIAGNOSIS — F32A Depression, unspecified: Secondary | ICD-10-CM | POA: Diagnosis not present

## 2023-07-08 NOTE — Progress Notes (Signed)
   Deneise Lever, LMFT

## 2023-07-17 ENCOUNTER — Ambulatory Visit (INDEPENDENT_AMBULATORY_CARE_PROVIDER_SITE_OTHER)

## 2023-07-17 ENCOUNTER — Other Ambulatory Visit: Payer: Self-pay

## 2023-07-17 DIAGNOSIS — Z1331 Encounter for screening for depression: Secondary | ICD-10-CM | POA: Diagnosis not present

## 2023-07-17 DIAGNOSIS — G8929 Other chronic pain: Secondary | ICD-10-CM | POA: Diagnosis not present

## 2023-07-17 DIAGNOSIS — M542 Cervicalgia: Secondary | ICD-10-CM | POA: Diagnosis not present

## 2023-07-17 DIAGNOSIS — R202 Paresthesia of skin: Secondary | ICD-10-CM | POA: Diagnosis not present

## 2023-07-17 DIAGNOSIS — Z01419 Encounter for gynecological examination (general) (routine) without abnormal findings: Secondary | ICD-10-CM | POA: Diagnosis not present

## 2023-07-17 DIAGNOSIS — Z30011 Encounter for initial prescription of contraceptive pills: Secondary | ICD-10-CM | POA: Diagnosis not present

## 2023-07-17 DIAGNOSIS — Z3009 Encounter for other general counseling and advice on contraception: Secondary | ICD-10-CM | POA: Diagnosis not present

## 2023-07-17 DIAGNOSIS — Z6833 Body mass index (BMI) 33.0-33.9, adult: Secondary | ICD-10-CM | POA: Diagnosis not present

## 2023-07-18 ENCOUNTER — Ambulatory Visit: Admitting: Family Medicine

## 2023-07-18 VITALS — Ht 66.0 in

## 2023-07-18 DIAGNOSIS — M5412 Radiculopathy, cervical region: Secondary | ICD-10-CM | POA: Insufficient documentation

## 2023-07-18 MED ORDER — GABAPENTIN 100 MG PO CAPS: 200.0000 mg | ORAL_CAPSULE | Freq: Every day | ORAL | 0 refills | Status: DC

## 2023-07-18 NOTE — Assessment & Plan Note (Signed)
 Cervical radiculopathy with also what appears to be more of a T1 distribution of the dermatome.  Could be an acute muscle injury causing tightness.  X-rays do show that there is a potential for a cervical rib.  This could increase the risk of impingement.  Discussed with patient's about different treatment options and is elected to try gabapentin  even though it does make her quite fatigued.  Will see how patient responds.  May need to decrease to 100 mg or try other medications depending on patient's response.  Patient feels that the pain is tolerable at the moment increasing weakness and pain needs to see us  sooner.  Patient is in agreement with the plan.  Did do a lot with a pressure washer that likely contributed.

## 2023-07-18 NOTE — Progress Notes (Signed)
 [name] [address] [place] [address] Phone: [phone] Subjective:    I'm seeing this patient by the request  of:  Geofm Glade PARAS, MD  CC: No neck pain  [id number]  Diana King is a 33 y.o. female coming in with complaint of acute onset of neck pain.  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  Seems to be more in the ring fingers but does seem to be bilateral.     Patient did have x-rays yesterday.  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  This could cause some foraminal impingement noted.  Past Medical History:  Diagnosis Date   Acne    Anxiety    Back pain    Depression    GERD (gastroesophageal reflux disease)    IBS (irritable bowel syndrome)    Overweight    Pain in Achilles tendon    Palpitations    Past Surgical History:  Procedure Laterality Date   NO PAST SURGERIES     Social History   Socioeconomic History   Marital status: Married    Spouse name: Dustin   Number of children: 1   Years of education: Not on file   Highest education level: Associate degree: academic program  Occupational History   Occupation: CMA  Tobacco Use   Smoking status: Never   Smokeless tobacco: Never  Substance and Sexual Activity   Alcohol use: Yes    Comment: social   Drug use: Not on file   Sexual activity: Not on file  Other Topics Concern   Not on file  Social History Narrative   Not on file   Social Drivers of Health   Financial Resource Strain: Low Risk  (07/17/2023)   Received from [facility]   Overall Financial Resource Strain (CARDIA)    Difficulty of Paying Living Expenses: Not hard at all  Food Insecurity: No Food Insecurity (07/17/2023)   Received from [facility]   Hunger  Vital Sign    Within the past 12 months, you worried that your food would run out before you got the money to buy more.: Never true    Within the past 12 months, the food you bought just didn't last and you didn't have money to get more.: Never true  Transportation Needs: No Transportation Needs (07/17/2023)   Received from [facility] - Transportation    Lack of Transportation (Medical): No    Lack of Transportation (Non-Medical): No  Physical Activity: Unknown (07/17/2023)   Received from [facility]   Exercise Vital Sign    On average, how many days per week do you engage in moderate to strenuous exercise (like a brisk walk)?: 0 days    Minutes of Exercise per Session: Not on file  Stress: No Stress Concern Present (07/17/2023)   Received from [facility] of Occupational Health - Occupational Stress Questionnaire    Feeling of Stress : Not at all  Social Connections: Socially Integrated (07/17/2023)   Received from [facility]   Social Network    How would you rate your social network (family, work, friends)?: Good participation with social networks   Allergies  Allergen Reactions   Latex Swelling   Topamax  [  Topiramate ] Other (See Comments)   Gabapentin     Family History  Problem Relation Age of Onset   Fibromyalgia Mother    Interstitial cystitis Mother    Migraines Mother    Depression Mother    Anxiety disorder Mother    Alcohol abuse Maternal Aunt    Mental illness Maternal Aunt    Alcohol abuse Maternal Uncle    Thyroid  disease Maternal Grandmother    Hyperlipidemia Father    Alcohol abuse Father     Current Outpatient Medications (Endocrine & Metabolic):    drospirenone-ethinyl estradiol (YAZ,GIANVI,LORYNA) 3-0.02 MG tablet, Take 1 tablet by mouth daily.   Tirzepatide  (MOUNJARO  [place]), Inject into the skin.  Current Outpatient Medications (Cardiovascular):    spironolactone (ALDACTONE) 50 MG tablet, Take 50 mg by mouth  daily.   Current Outpatient Medications (Analgesics):    meloxicam  (MOBIC ) 15 MG tablet, Take 1 tablet (15 mg total) by mouth daily.   Current Outpatient Medications (Other):    gabapentin  (NEURONTIN ) 100 MG capsule, Take 2 capsules (200 mg total) by mouth at bedtime.   Adapalene  0.3 % gel, Apply 1 application topically at bedtime.   buPROPion  (WELLBUTRIN  XL) 300 MG 24 hr tablet, Take 1 tablet (300 mg total) by mouth daily.   busPIRone  (BUSPAR ) 5 MG tablet, TAKE 1 TABLET BY MOUTH TWICE A DAY   cyclobenzaprine  (FLEXERIL ) 5 MG tablet, Take 1 tablet (5 mg total) by mouth daily as needed for muscle spasms.   escitalopram  (LEXAPRO ) 20 MG tablet, Take 1 tablet (20 mg total) by mouth daily.   tiZANidine  (ZANAFLEX ) 4 MG tablet, TAKE 1 TABLET BY MOUTH EVERYDAY AT BEDTIME   Reviewed prior external information including notes and imaging from  primary care provider As well as notes that were available from care everywhere and other healthcare systems.  Past medical history, social, surgical and family history all reviewed in electronic medical record.  No pertanent information unless stated regarding to the chief complaint.   Review of Systems:  No headache, visual changes, nausea, vomiting, diarrhea, constipation, dizziness, abdominal pain, skin rash, fevers, chills, night sweats, weight loss, swollen lymph nodes, body aches, joint swelling, chest pain, shortness of breath, mood changes. POSITIVE muscle aches  Objective  Height 5' 6 (1.676 m), last menstrual period 06/18/2023.   General: No apparent distress alert and oriented x3 mood and affect normal, dressed appropriately.  HEENT: Pupils equal, extraocular movements intact  Respiratory: Patient's speak in full sentences and does not appear short of breath  Cardiovascular: No lower extremity edema, non tender, no erythema  Neck exam shows good flexion and extension.  Patient does have weakness in the C8 distribution noted.  Patient also has  some very mild numbness noted in the C8-T1 dermatome bilaterally.  Deep tendon reflexes are intact.  Negative Tinel's noted.    Impression and Recommendations:     The above documentation has been reviewed and is accurate and complete Prakriti Carignan M Jaquann Guarisco, DO

## 2023-07-21 ENCOUNTER — Encounter: Payer: Self-pay | Admitting: Internal Medicine

## 2023-07-21 DIAGNOSIS — Z3009 Encounter for other general counseling and advice on contraception: Secondary | ICD-10-CM | POA: Diagnosis not present

## 2023-07-21 DIAGNOSIS — N945 Secondary dysmenorrhea: Secondary | ICD-10-CM | POA: Diagnosis not present

## 2023-07-21 DIAGNOSIS — N920 Excessive and frequent menstruation with regular cycle: Secondary | ICD-10-CM | POA: Diagnosis not present

## 2023-07-22 ENCOUNTER — Other Ambulatory Visit: Payer: Self-pay

## 2023-07-22 MED ORDER — ESCITALOPRAM OXALATE 10 MG PO TABS: 10.0000 mg | ORAL_TABLET | Freq: Every day | ORAL | 1 refills | Status: DC

## 2023-07-23 ENCOUNTER — Ambulatory Visit: Payer: Self-pay | Admitting: Family Medicine

## 2023-07-29 ENCOUNTER — Ambulatory Visit: Admitting: Family Medicine

## 2023-07-29 ENCOUNTER — Encounter: Payer: Self-pay | Admitting: Family Medicine

## 2023-07-29 VITALS — Ht 66.0 in

## 2023-07-29 DIAGNOSIS — M9902 Segmental and somatic dysfunction of thoracic region: Secondary | ICD-10-CM

## 2023-07-29 DIAGNOSIS — M9904 Segmental and somatic dysfunction of sacral region: Secondary | ICD-10-CM | POA: Diagnosis not present

## 2023-07-29 DIAGNOSIS — M9903 Segmental and somatic dysfunction of lumbar region: Secondary | ICD-10-CM

## 2023-07-29 DIAGNOSIS — M9901 Segmental and somatic dysfunction of cervical region: Secondary | ICD-10-CM

## 2023-07-29 DIAGNOSIS — R293 Abnormal posture: Secondary | ICD-10-CM

## 2023-07-29 DIAGNOSIS — M5412 Radiculopathy, cervical region: Secondary | ICD-10-CM

## 2023-07-29 DIAGNOSIS — M9908 Segmental and somatic dysfunction of rib cage: Secondary | ICD-10-CM | POA: Diagnosis not present

## 2023-07-29 NOTE — Progress Notes (Signed)
 error

## 2023-07-29 NOTE — Progress Notes (Signed)
 [facility] [address] [place] [address] Phone: [phone] Subjective:    I'm seeing this patient by the request  of:  Geofm Glade PARAS, MD  CC: Upper back pain  YEP:Dlagzrupcz  Diana King is a 33 y.o. female coming in with complaint of upper back pain.  Feels that the neck pain is significantly better as well as the radicular symptoms patient was having in the C8 distribution.  Gabapentin  has been helpful.      Past Medical History:  Diagnosis Date   Acne    Anxiety    Back pain    Depression    GERD (gastroesophageal reflux disease)    IBS (irritable bowel syndrome)    Overweight    Pain in Achilles tendon    Palpitations    Past Surgical History:  Procedure Laterality Date   NO PAST SURGERIES     Social History   Socioeconomic History   Marital status: Married    Spouse name: Dustin   Number of children: 1   Years of education: Not on file   Highest education level: Associate degree: academic program  Occupational History   Occupation: CMA  Tobacco Use   Smoking status: Never   Smokeless tobacco: Never  Substance and Sexual Activity   Alcohol use: Yes    Comment: social   Drug use: Not on file   Sexual activity: Not on file  Other Topics Concern   Not on file  Social History Narrative   Not on file   Social Drivers of Health   Financial Resource Strain: Low Risk  (07/17/2023)   Received from [facility]   Overall Financial Resource Strain (CARDIA)    Difficulty of Paying Living Expenses: Not hard at all  Food Insecurity: No Food Insecurity (07/17/2023)   Received from [facility]   Hunger Vital Sign    Within the past 12 months, you worried that your food would run out before you got the money to buy more.: Never true    Within the past 12 months, the food you bought just didn't last and you didn't have money to get more.: Never true  Transportation Needs: No Transportation Needs (07/17/2023)    Received from [facility] - Transportation    Lack of Transportation (Medical): No    Lack of Transportation (Non-Medical): No  Physical Activity: Unknown (07/17/2023)   Received from [facility]   Exercise Vital Sign    On average, how many days per week do you engage in moderate to strenuous exercise (like a brisk walk)?: 0 days    Minutes of Exercise per Session: Not on file  Stress: No Stress Concern Present (07/17/2023)   Received from [facility] - Occupational Stress Questionnaire    Feeling of Stress : Not at all  Social Connections: Socially Integrated (07/17/2023)   Received from [facility]   Social Network    How would you rate your social network (family, work, friends)?: Good participation with social networks   Allergies  Allergen Reactions   Latex Swelling   Topamax  [Topiramate ] Other (See Comments)   Gabapentin     Family History  Problem Relation Age of Onset   Fibromyalgia Mother    Interstitial cystitis Mother    Migraines Mother    Depression Mother    Anxiety disorder Mother    Alcohol abuse Maternal Aunt    Mental illness Maternal Aunt  Alcohol abuse Maternal Uncle    Thyroid  disease Maternal Grandmother    Hyperlipidemia Father    Alcohol abuse Father     Current Outpatient Medications (Endocrine & Metabolic):    drospirenone-ethinyl estradiol (YAZ,GIANVI,LORYNA) 3-0.02 MG tablet, Take 1 tablet by mouth daily.   Tirzepatide  (MOUNJARO  [place]), Inject into the skin.  Current Outpatient Medications (Cardiovascular):    spironolactone (ALDACTONE) 50 MG tablet, Take 50 mg by mouth daily.   Current Outpatient Medications (Analgesics):    meloxicam  (MOBIC ) 15 MG tablet, Take 1 tablet (15 mg total) by mouth daily.   Current Outpatient Medications (Other):    Adapalene  0.3 % gel, Apply 1 application topically at bedtime.   buPROPion  (WELLBUTRIN  XL) 300 MG 24 hr tablet, Take 1 tablet (300 mg  total) by mouth daily.   busPIRone  (BUSPAR ) 5 MG tablet, TAKE 1 TABLET BY MOUTH TWICE A DAY   cyclobenzaprine  (FLEXERIL ) 5 MG tablet, Take 1 tablet (5 mg total) by mouth daily as needed for muscle spasms.   escitalopram  (LEXAPRO ) 10 MG tablet, Take 1 tablet (10 mg total) by mouth daily.   escitalopram  (LEXAPRO ) 20 MG tablet, Take 1 tablet (20 mg total) by mouth daily.   gabapentin  (NEURONTIN ) 100 MG capsule, Take 2 capsules (200 mg total) by mouth at bedtime.   tiZANidine  (ZANAFLEX ) 4 MG tablet, TAKE 1 TABLET BY MOUTH EVERYDAY AT BEDTIME   Reviewed prior external information including notes and imaging from  primary care provider As well as notes that were available from care everywhere and other healthcare systems.  Past medical history, social, surgical and family history all reviewed in electronic medical record.  No pertanent information unless stated regarding to the chief complaint.   Review of Systems:  No headache, visual changes, nausea, vomiting, diarrhea, constipation, dizziness, abdominal pain, skin rash, fevers, chills, night sweats, weight loss, swollen lymph nodes, body aches, joint swelling, chest pain, shortness of breath, mood changes. POSITIVE muscle aches  Objective  Height 5' 6 (1.676 m), last menstrual period 06/18/2023, SpO2 99%.   General: No apparent distress alert and oriented x3 mood and affect normal, dressed appropriately.  HEENT: Pupils equal, extraocular movements intact  Respiratory: Patient's speak in full sentences and does not appear short of breath  Cardiovascular: No lower extremity edema, non tender, no erythema   Upper back does have tightness noted with multiple trigger points in the right parascapular area.  Osteopathic findings C2 flexed rotated and side bent right C4 flexed rotated and side bent left C7 flexed rotated and side bent left T3 extended rotated and side bent right inhaled third rib T8 extended rotated and side bent left L2  flexed rotated and side bent right Sacrum right on right    Impression and Recommendations:     The above documentation has been reviewed and is accurate and complete Liv Rallis M Everest Hacking, DO

## 2023-07-29 NOTE — Assessment & Plan Note (Signed)
 Significant improvement noted.  Continue to work on core strengthening exercises.  Discussed icing regimen.  Patient will continue to monitor and take gabapentin  as needed.  Responded well to the short course of steroids.

## 2023-07-29 NOTE — Assessment & Plan Note (Signed)
 Continue to work on Air cabin crew.  Patient does have good amount of breast tissue that may play a role a significants well  Patient is to continue to work on core strengthening exercises.  Will make ergonomic changes were possible.  Likely not having so much of the radicular symptoms into the hands at this moment.  Discussed which activities to do and which ones to avoid.  Increase activity slowly.  Follow-up again in 6 to 8 weeks

## 2023-08-01 ENCOUNTER — Ambulatory Visit: Admitting: Internal Medicine

## 2023-08-01 VITALS — BP 138/90 | HR 105 | Temp 98.0°F | Ht 66.0 in | Wt 200.0 lb

## 2023-08-01 DIAGNOSIS — H6090 Unspecified otitis externa, unspecified ear: Secondary | ICD-10-CM | POA: Insufficient documentation

## 2023-08-01 DIAGNOSIS — H60502 Unspecified acute noninfective otitis externa, left ear: Secondary | ICD-10-CM | POA: Diagnosis not present

## 2023-08-01 DIAGNOSIS — K219 Gastro-esophageal reflux disease without esophagitis: Secondary | ICD-10-CM | POA: Diagnosis not present

## 2023-08-01 MED ORDER — NEOMYCIN-POLYMYXIN-HC 3.5-10000-1 OT SOLN: 3.0000 [drp] | Freq: Three times a day (TID) | OTIC | 0 refills | Status: DC

## 2023-08-01 NOTE — Patient Instructions (Signed)
 We have sent in the ear drops to use 3 drops in the ear 3 times a day for 3 days when needed.

## 2023-08-01 NOTE — Progress Notes (Signed)
   Subjective:   Patient ID: Diana King, female    DOB: 11/16/1990, 33 y.o.   MRN: [id number]  HPI The patient is a 33 YO female coming in for ear pain with pressure for about 1 month. Is improved but still some pressure. No fevers or chills. Some chronic allergy symptoms. Using oral but does not tolerate flonase. Tongue also feels slightly large a few mornings recently.  Review of Systems  Constitutional: Negative.   HENT:  Positive for ear pain.   Eyes: Negative.   Respiratory:  Negative for cough, chest tightness and shortness of breath.   Cardiovascular:  Negative for chest pain, palpitations and leg swelling.  Gastrointestinal:  Negative for abdominal distention, abdominal pain, constipation, diarrhea, nausea and vomiting.  Musculoskeletal: Negative.   Skin: Negative.   Neurological: Negative.   Psychiatric/Behavioral: Negative.      Objective:  Physical Exam Constitutional:      Appearance: She is well-developed.  HENT:     Head: Normocephalic and atraumatic.     Right Ear: Tympanic membrane normal.     Left Ear: Tympanic membrane normal.     Ears:     Comments: Slight bulging left TM clear fluid Cardiovascular:     Rate and Rhythm: Normal rate and regular rhythm.  Pulmonary:     Effort: Pulmonary effort is normal. No respiratory distress.     Breath sounds: Normal breath sounds. No wheezing or rales.  Abdominal:     General: Bowel sounds are normal. There is no distension.     Palpations: Abdomen is soft.     Tenderness: There is no abdominal tenderness. There is no rebound.  Musculoskeletal:     Cervical back: Normal range of motion.  Skin:    General: Skin is warm and dry.  Neurological:     Mental Status: She is alert and oriented to person, place, and time.     Coordination: Coordination normal.     Vitals:   08/01/23 1137  BP: (!) 138/90  Pulse: (!) 105  Temp: 98 F (36.7 C)  TempSrc: Oral  SpO2: 99%  Weight: 200 lb (90.7 kg)  Height: 5' 6  (1.676 m)    Assessment & Plan:
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
## 2023-08-01 NOTE — Assessment & Plan Note (Addendum)
 Rx cortisporin ear drops to use left ear and then as needed for recurrences.

## 2023-08-06 ENCOUNTER — Ambulatory Visit: Admitting: Behavioral Health

## 2023-08-18 ENCOUNTER — Ambulatory Visit: Admitting: Family Medicine

## 2023-08-18 ENCOUNTER — Encounter: Payer: Self-pay | Admitting: Family Medicine

## 2023-08-18 VITALS — BP 120/80 | Ht 66.0 in

## 2023-08-18 DIAGNOSIS — M5459 Other low back pain: Secondary | ICD-10-CM

## 2023-08-18 NOTE — Assessment & Plan Note (Signed)
 Repeat trigger point is given again today.  Tolerated the procedure well.  Did them over the right sacroiliac joint and the left sacroiliac joint.  Seems to be more severe on the right.  Had significant amount of tightness noted throughout the axial skeleton today.  Patient denies any recent bowel trouble since difficulty with diarrhea last week.  Has had normal bowel regimen since.  No difficulty with menstruation.  Traveled last week but no swelling of the lower extremities.  Worsening pain told her to seek more medical attention.  Follow-up with me again in 1 to 2 weeks

## 2023-08-18 NOTE — Progress Notes (Signed)
 [name] [address] [place] [address] Phone: [phone] Subjective:    I'm seeing this patient by the request  of:  Geofm Glade PARAS, MD  CC: Low back pain   YEP:Dlagzrupcz  Diana King is a 33 y.o. female coming in with complaint of low back pain.       Past Medical History:  Diagnosis Date   Acne    Anxiety    Back pain    Depression    GERD (gastroesophageal reflux disease)    IBS (irritable bowel syndrome)    Overweight    Pain in Achilles tendon    Palpitations    Past Surgical History:  Procedure Laterality Date   NO PAST SURGERIES     Social History   Socioeconomic History   Marital status: Married    Spouse name: Dustin   Number of children: 1   Years of education: Not on file   Highest education level: Associate degree: academic program  Occupational History   Occupation: CMA  Tobacco Use   Smoking status: Never   Smokeless tobacco: Never  Substance and Sexual Activity   Alcohol use: Yes    Comment: social   Drug use: Not on file   Sexual activity: Not on file  Other Topics Concern   Not on file  Social History Narrative   Not on file   Social Drivers of Health   Financial Resource Strain: Low Risk  (08/01/2023)   Overall Financial Resource Strain (CARDIA)    Difficulty of Paying Living Expenses: Not very hard  Food Insecurity: No Food Insecurity (08/01/2023)   Hunger Vital Sign    Worried About Running Out of Food in the Last Year: Never true    Ran Out of Food in the Last Year: Never true  Transportation Needs: No Transportation Needs (08/01/2023)   PRAPARE - Administrator, Civil Service (Medical): No    Lack of Transportation (Non-Medical): No  Physical Activity: Inactive (08/01/2023)   Exercise Vital Sign    Days of Exercise per Week: 0 days    Minutes of Exercise per Session: Not on file  Stress: No Stress Concern Present (08/01/2023)   [employer] [facility] Occupational Health  - Occupational Stress Questionnaire    Feeling of Stress: Only a little  Social Connections: Socially Isolated (08/01/2023)   Social Connection and Isolation Panel    Frequency of Communication with Friends and Family: Never    Frequency of Social Gatherings with Friends and Family: Never    Attends Religious Services: Never    Diplomatic Services operational officer: No    Attends Engineer, structural: Not on file    Marital Status: Married   Allergies  Allergen Reactions   Latex Swelling   Topamax  [Topiramate ] Other (See Comments)   Gabapentin     Family History  Problem Relation Age of Onset   Fibromyalgia Mother    Interstitial cystitis Mother    Migraines Mother    Depression Mother    Anxiety disorder Mother    Alcohol abuse Maternal Aunt    Mental illness Maternal Aunt    Alcohol abuse Maternal Uncle    Thyroid  disease Maternal Grandmother    Hyperlipidemia Father    Alcohol abuse Father     Current Outpatient Medications (Endocrine & Metabolic):    drospirenone-ethinyl estradiol (YAZ,GIANVI,LORYNA) 3-0.02 MG tablet, Take 1 tablet by mouth daily.   Tirzepatide  (MOUNJARO  [place]), Inject into the  skin.  Current Outpatient Medications (Cardiovascular):    spironolactone (ALDACTONE) 50 MG tablet, Take 50 mg by mouth daily.   Current Outpatient Medications (Analgesics):    meloxicam  (MOBIC ) 15 MG tablet, Take 1 tablet (15 mg total) by mouth daily.   Current Outpatient Medications (Other):    Adapalene  0.3 % gel, Apply 1 application topically at bedtime.   buPROPion  (WELLBUTRIN  XL) 300 MG 24 hr tablet, Take 1 tablet (300 mg total) by mouth daily.   busPIRone  (BUSPAR ) 5 MG tablet, TAKE 1 TABLET BY MOUTH TWICE A DAY   cyclobenzaprine  (FLEXERIL ) 5 MG tablet, Take 1 tablet (5 mg total) by mouth daily as needed for muscle spasms.   escitalopram  (LEXAPRO ) 10 MG tablet, Take 1 tablet (10 mg total) by mouth daily.   escitalopram  (LEXAPRO ) 20 MG tablet, Take 1 tablet (20  mg total) by mouth daily.   gabapentin  (NEURONTIN ) 100 MG capsule, Take 2 capsules (200 mg total) by mouth at bedtime.   neomycin -polymyxin-hydrocortisone (CORTISPORIN) OTIC solution, Place 3 drops into both ears 3 (three) times daily.   tiZANidine  (ZANAFLEX ) 4 MG tablet, TAKE 1 TABLET BY MOUTH EVERYDAY AT BEDTIME    Objective  Blood pressure 120/80, height 5' 6 (1.676 m), last menstrual period 07/25/2023, SpO2 98%.   General: No apparent distress alert and oriented x3 mood and affect normal, dressed appropriately.  HEENT: Pupils equal, extraocular movements intact  Respiratory: Patient's speak in full sentences and does not appear short of breath  Cardiovascular: No lower extremity edema, non tender, no erythema  Low back pain severe. Unable to check too much Multiple trigger points noted over the Si joint. Unable to do faber due to pain   After verbal consent patient was prepped with alcohol swab and with a 21-gauge 2 inch needle injected in the trigger points noted over the left and right sacroiliac joints.  A total of 2 cc 0.5% Marcaine and 1 cc of Kenalog 40 mg/mL used.  Mild blood loss.  Band-Aid placed.  Postinjection instructions given     Impression and Recommendations:     The above documentation has been reviewed and is accurate and complete Roseanne Juenger M Deborah Lazcano, DO

## 2023-08-20 NOTE — Progress Notes (Signed)
 [facility] Counselor/Therapist Progress Note  Patient ID: Diana King, MRN: [id number],    Date: 05/05/2023  Time Spent: 50 min In Person @ [facility] Office Time In: 4:00pm Time Out: 4:50pm   Treatment Type: Individual Therapy  Reported Symptoms: Elevated anx/dep & reduction in stress due to Mother's beh  Mental Status Exam: Appearance:  Casual     Behavior: Appropriate and Sharing  Motor: Normal  Speech/Language:  Clear and Coherent  Affect: Appropriate  Mood: normal  Thought process: normal  Thought content:   WNL  Sensory/Perceptual disturbances:   WNL  Orientation: oriented to person, place, time/date, and situation  Attention: Good  Concentration: Good  Memory: WNL  Fund of knowledge:  Good  Insight:   Good  Judgment:  Good  Impulse Control: Good   Risk Assessment: Danger to Self:  No Self-injurious Behavior: No Danger to Others: No Duty to Warn:no Physical Aggression / Violence:No  Access to Firearms a concern: No  Gang Involvement:No   Subjective: Pt is looking ahead today to her Dtr's future in Sch & wanting the best for her. She is bound for [employer] & wants her friends to remain where they are-it is difficult socially. When Pt was growing up, she had many friends & was very social. Dtr is currently participating in [employer] & likes the [employer]. Pt is worried for the next few yrs for Dtr through H Sch.   Pt & Mother have been doing better. Pt learned a lot about her Mother through the latest turmoil & knows her Mother has a Hx of tumoil. Her Mother has initiated an Medical illustrator; one they share & both write inside. It is creative.   Interventions: Interpersonal  Diagnosis:Anxiety and depression  Plan: Diana King is proud of the strides her Dtr has made & wants her improvements to cont. She is striving to be a good Mother & not over-extend herself. She will cont these efforts & keep her boundaries w/Family.   Target Date:  06/05/2023  Progress: 7  Frequency: Once every 2-3 wks  Modality: Diana Richerd LITTIE Hollace, LMFT

## 2023-08-25 ENCOUNTER — Other Ambulatory Visit: Payer: Self-pay | Admitting: Family Medicine

## 2023-08-25 MED ORDER — PREDNISONE 20 MG PO TABS: 40.0000 mg | ORAL_TABLET | Freq: Every day | ORAL | 0 refills | Status: DC

## 2023-08-25 NOTE — Progress Notes (Signed)
 Prednisone refilled

## 2023-09-10 NOTE — Progress Notes (Signed)
 [facility] Counselor/Therapist Progress Note  Patient ID: Diana King, MRN: [id number],    Date: 07/08/2023  Time Spent: 50 min In Person @ [facility] Office Time In: 4:00pm Time Out: 4:50pm   Treatment Type: Individual Therapy  Reported Symptoms: Reduced motivation & energy for herself & an inc in isolating beh  Mental Status Exam: Appearance:  Casual     Behavior: Appropriate and Sharing  Motor: Normal  Speech/Language:  Clear and Coherent  Affect: Appropriate  Mood: anxious and depressed  Thought process: normal  Thought content:   WNL  Sensory/Perceptual disturbances:   WNL  Orientation: oriented to person, place, time/date, and situation  Attention: Good  Concentration: Good  Memory: WNL  Fund of knowledge:  Good  Insight:   Fair  Judgment:  Good  Impulse Control: Good   Risk Assessment: Danger to Self:  No Self-injurious Behavior: No Danger to Others: No Duty to Warn:no Physical Aggression / Violence:No  Access to Firearms a concern: No  Gang Involvement:No   Subjective: Pt is adjusting to the end of Sch & her Dtr's transition to Mid [employer] next Fall. Husb Dustin isgoing to Sch for Cert'tn in Work Health.   Pt is trying to support her Family through changes this end of Sch year.   Interventions: Family Systems and Interpersonal  Diagnosis:Anxiety and depression  Plan: Ronesha will explore the [email or web address] website run by Harlene Mis to assist her w/coping @ work & in the home w/all the changes.  Target Date: 08/05/2023  Progress: 6  Frequency: Once every 2-3 wks  Modality: Kennis Richerd LITTIE Hollace, LMFT

## 2023-09-26 DIAGNOSIS — Z01812 Encounter for preprocedural laboratory examination: Secondary | ICD-10-CM | POA: Diagnosis not present

## 2023-10-07 DIAGNOSIS — N945 Secondary dysmenorrhea: Secondary | ICD-10-CM | POA: Diagnosis not present

## 2023-10-07 DIAGNOSIS — N920 Excessive and frequent menstruation with regular cycle: Secondary | ICD-10-CM | POA: Diagnosis not present

## 2023-10-07 DIAGNOSIS — Z302 Encounter for sterilization: Secondary | ICD-10-CM | POA: Diagnosis not present

## 2023-10-07 DIAGNOSIS — Z79899 Other long term (current) drug therapy: Secondary | ICD-10-CM | POA: Diagnosis not present

## 2023-10-13 NOTE — Progress Notes (Unsigned)
 [name] [address] [place] [address] Phone: [phone]   Assessment and Plan:     1. Strain of left rhomboid muscle (Primary) -Acute, initial visit - Consistent with acute strain of left rhomboid musculature, likely flared from sleeping position, or position during recent surgical procedure - Patient elected for trigger point injections.  Tolerated well per note below - May continue ibuprofen 800 mg daily as needed, and tizanidine  2 to 4 mg daily as needed for muscle spasms  Trigger Point Injection: After informed consent was obtained, skin cleaned with alcohol  prep.  A total of 2 trigger points identified along left rhomboid.  Injections given over area of pain for total injection of 0.5 mL Kenalog  40 mg/ML, and 1.5 ml lidocaine 1% w/o epi.  Patient had relief after the injection without side effects.  Pt given signs of infection to watch for.     Pertinent previous records reviewed include none   Follow Up: As needed.  Could consider OMT   Subjective:   I, Diana King am a scribe for  Dr. Leonce.    Chief Complaint: scap pain   HPI:  07/29/2023  Diana King is a 33 y.o. female coming in with complaint of upper back pain.  Feels that the neck pain is significantly better as well as the radicular symptoms patient was having in the C8 distribution.  Gabapentin  has been helpful.   10/14/2023 Patient states Pinpoint pain around the left side right under scapular. Pain started Friday. It is fine when she lays down. It hurts worse later in the day and when standing and sitting for long periods of time. Taking Ibu 800 mg and tizanidine  at night.    Relevant Historical Information: none  Additional pertinent review of systems negative.   Current Outpatient Medications:    Adapalene  0.3 % gel, Apply 1 application topically at bedtime., Disp: 45 g, Rfl: 5   buPROPion  (WELLBUTRIN  XL) 300 MG 24 hr tablet,  Take 1 tablet (300 mg total) by mouth daily., Disp: 90 tablet, Rfl: 3   busPIRone  (BUSPAR ) 5 MG tablet, TAKE 1 TABLET BY MOUTH TWICE A DAY, Disp: 180 tablet, Rfl: 3   escitalopram  (LEXAPRO ) 10 MG tablet, Take 1 tablet (10 mg total) by mouth daily., Disp: 90 tablet, Rfl: 1   meloxicam  (MOBIC ) 15 MG tablet, Take 1 tablet (15 mg total) by mouth daily., Disp: 30 tablet, Rfl: 0   spironolactone (ALDACTONE) 50 MG tablet, Take 50 mg by mouth daily., Disp: , Rfl:    tiZANidine  (ZANAFLEX ) 4 MG tablet, TAKE 1 TABLET BY MOUTH EVERYDAY AT BEDTIME, Disp: 90 tablet, Rfl: 1   cyclobenzaprine  (FLEXERIL ) 5 MG tablet, Take 1 tablet (5 mg total) by mouth daily as needed for muscle spasms., Disp: 40 tablet, Rfl: 0   drospirenone-ethinyl estradiol (YAZ,GIANVI,LORYNA) 3-0.02 MG tablet, Take 1 tablet by mouth daily., Disp: , Rfl:    escitalopram  (LEXAPRO ) 20 MG tablet, Take 1 tablet (20 mg total) by mouth daily., Disp: 90 tablet, Rfl: 1   gabapentin  (NEURONTIN ) 100 MG capsule, Take 2 capsules (200 mg total) by mouth at bedtime., Disp: 180 capsule, Rfl: 0   neomycin -polymyxin-hydrocortisone (CORTISPORIN) OTIC solution, Place 3 drops into both ears 3 (three) times daily., Disp: 10 mL, Rfl: 0   predniSONE  (DELTASONE ) 20 MG tablet, Take 2 tablets (40 mg total) by mouth daily with breakfast., Disp: 14 tablet, Rfl: 0   Tirzepatide  (MOUNJARO  [place]), Inject into the skin.,  Disp: , Rfl:    Objective:     Vitals:   10/14/23 0906  BP: 112/70  Pulse: 89  SpO2: 96%  Height: 5' 6 (1.676 m)      Body mass index is 32.28 kg/m.    Physical Exam:    Gen: Appears well, nad, nontoxic and pleasant Psych: Alert and oriented, appropriate mood and affect Neuro: sensation intact, strength is 5/5 in upper and lower extremities, muscle tone wnl Skin: no susupicious lesions or rashes  Back - Normal skin, Spine with normal alignment and no deformity.   No tenderness to vertebral process palpation.   Left thoracic paraspinous muscles  are   tender and without spasm TTP left rhomboid  Gait normal    Electronically signed by:  Odis Mace D[name] 9:46 AM 10/14/23

## 2023-10-14 ENCOUNTER — Ambulatory Visit (INDEPENDENT_AMBULATORY_CARE_PROVIDER_SITE_OTHER): Admitting: Sports Medicine

## 2023-10-14 VITALS — BP 112/70 | HR 89 | Ht 66.0 in

## 2023-10-14 DIAGNOSIS — S29012A Strain of muscle and tendon of back wall of thorax, initial encounter: Secondary | ICD-10-CM | POA: Diagnosis not present

## 2023-10-14 NOTE — Patient Instructions (Addendum)
 Trigger point release today. Continue Ibuprofen and tizanidine  as needed. Follow up as needed.

## 2023-10-16 ENCOUNTER — Encounter: Payer: Self-pay | Admitting: Internal Medicine

## 2023-10-16 ENCOUNTER — Ambulatory Visit: Admitting: Internal Medicine

## 2023-10-16 VITALS — BP 122/70 | HR 92 | Temp 98.2°F | Ht 66.0 in | Wt 210.0 lb

## 2023-10-16 DIAGNOSIS — L03311 Cellulitis of abdominal wall: Secondary | ICD-10-CM | POA: Diagnosis not present

## 2023-10-16 DIAGNOSIS — R21 Rash and other nonspecific skin eruption: Secondary | ICD-10-CM

## 2023-10-16 DIAGNOSIS — L0390 Cellulitis, unspecified: Secondary | ICD-10-CM | POA: Insufficient documentation

## 2023-10-16 MED ORDER — HYDROXYZINE PAMOATE 25 MG PO CAPS: 25.0000 mg | ORAL_CAPSULE | Freq: Three times a day (TID) | ORAL | 0 refills | Status: AC | PRN

## 2023-10-16 MED ORDER — FLUCONAZOLE 150 MG PO TABS: 150.0000 mg | ORAL_TABLET | ORAL | 0 refills | Status: AC

## 2023-10-16 MED ORDER — TRIAMCINOLONE ACETONIDE 0.1 % EX CREA: 1.0000 | TOPICAL_CREAM | Freq: Two times a day (BID) | CUTANEOUS | 0 refills | Status: AC

## 2023-10-16 MED ORDER — AMOXICILLIN-POT CLAVULANATE 875-125 MG PO TABS: 1.0000 | ORAL_TABLET | Freq: Two times a day (BID) | ORAL | 0 refills | Status: AC

## 2023-10-16 NOTE — Progress Notes (Signed)
    Subjective:    Patient ID: Diana King, female    DOB: 1990/05/14, 33 y.o.   MRN: [id number]      HPI Voncille is here for  Chief Complaint  Patient presents with   Cellulitis    Cellulitis across her stomach (red and itchy)    Fallopian tubes removed and uterine ablation laparoscopically on 10/07/23.  Procedure went well.  2 days ago-1 week after surgery she started to have itching and redness at her incision sites.  The redness has expanded some.  There is some soreness and some warmth.  She has also noticed a rash on her lateral left groin and upper thigh and upper chest that is itchy.  She denies any true pain.  She has not had any fevers or chills.  She has not placed anything on the areas.       Medications and allergies reviewed with patient and updated if appropriate.  Current Outpatient Medications on File Prior to Visit  Medication Sig Dispense Refill   Adapalene  0.3 % gel Apply 1 application topically at bedtime. 45 g 5   buPROPion  (WELLBUTRIN  XL) 300 MG 24 hr tablet Take 1 tablet (300 mg total) by mouth daily. 90 tablet 3   busPIRone  (BUSPAR ) 5 MG tablet TAKE 1 TABLET BY MOUTH TWICE A DAY 180 tablet 3   escitalopram  (LEXAPRO ) 10 MG tablet Take 1 tablet (10 mg total) by mouth daily. 90 tablet 1   ibuprofen (ADVIL) 800 MG tablet Take 800 mg by mouth.     meloxicam  (MOBIC ) 15 MG tablet Take 1 tablet (15 mg total) by mouth daily. 30 tablet 0   spironolactone (ALDACTONE) 50 MG tablet Take 50 mg by mouth daily.     tiZANidine  (ZANAFLEX ) 4 MG tablet TAKE 1 TABLET BY MOUTH EVERYDAY AT BEDTIME 90 tablet 1   No current facility-administered medications on file prior to visit.    Review of Systems     Objective:   Vitals:   10/16/23 0811  BP: 122/70  Pulse: 92  Temp: 98.2 F (36.8 C)  SpO2: 97%   BP Readings from Last 3 Encounters:  10/16/23 122/70  10/14/23 112/70  08/18/23 120/80   Wt Readings from Last 3 Encounters:  10/16/23 210 lb (95.3 kg)   08/01/23 200 lb (90.7 kg)  04/21/23 200 lb (90.7 kg)   Body mass index is 33.89 kg/m.    Physical Exam Constitutional:      General: She is not in acute distress.    Appearance: Normal appearance. She is not ill-appearing.  HENT:     Head: Normocephalic and atraumatic.  Abdominal:     General: There is no distension.     Palpations: Abdomen is soft.     Tenderness: There is no abdominal tenderness.  Skin:    General: Skin is warm and dry.     Comments: Laparoscopic incisions bilateral mid abdomen and umbilicus with surrounding erythema, slight tenderness, slight warmth.  No open wounds or discharge.  Macular papular rash across upper abdomen/lower chest and left lateral groin and upper thigh  Neurological:     Mental Status: She is alert.            Assessment & Plan:    See Problem List for Assessment and Plan of chronic medical problems.

## 2023-10-16 NOTE — Assessment & Plan Note (Signed)
 Acute Patient used to have developing cellulitis around incision marks from laparoscopic procedure done last week Could also be allergic reaction to something topical placed on these areas before or after surgery Start Augmentin  875-125 mg twice daily x 7 days-Diflucan  if needed Continue over-the-counter antihistamine Triamcinolone  cream to areas if not improving

## 2023-10-16 NOTE — Assessment & Plan Note (Signed)
 Acute Macular papular rash across upper abdomen/lower chest and left lateral groin, upper thigh Possibly contact dermatitis Continue over-the-counter antihistamine, hydroxyzine  25 mg every 8 hours as needed for itch Triamcinolone  0.1% cream twice daily as needed

## 2023-10-16 NOTE — Patient Instructions (Signed)
 SABRA

## 2023-10-17 DIAGNOSIS — Z79899 Other long term (current) drug therapy: Secondary | ICD-10-CM | POA: Diagnosis not present

## 2023-10-17 DIAGNOSIS — L700 Acne vulgaris: Secondary | ICD-10-CM | POA: Diagnosis not present

## 2023-12-25 ENCOUNTER — Ambulatory Visit: Admitting: Sports Medicine

## 2023-12-25 VITALS — BP 124/82 | HR 86 | Ht 66.0 in | Wt 209.0 lb

## 2023-12-25 DIAGNOSIS — M9902 Segmental and somatic dysfunction of thoracic region: Secondary | ICD-10-CM | POA: Diagnosis not present

## 2023-12-25 DIAGNOSIS — M545 Low back pain, unspecified: Secondary | ICD-10-CM

## 2023-12-25 DIAGNOSIS — G8929 Other chronic pain: Secondary | ICD-10-CM

## 2023-12-25 DIAGNOSIS — M546 Pain in thoracic spine: Secondary | ICD-10-CM

## 2023-12-25 DIAGNOSIS — M9901 Segmental and somatic dysfunction of cervical region: Secondary | ICD-10-CM

## 2023-12-25 DIAGNOSIS — M9908 Segmental and somatic dysfunction of rib cage: Secondary | ICD-10-CM

## 2023-12-25 DIAGNOSIS — M9905 Segmental and somatic dysfunction of pelvic region: Secondary | ICD-10-CM

## 2023-12-25 DIAGNOSIS — M9903 Segmental and somatic dysfunction of lumbar region: Secondary | ICD-10-CM

## 2023-12-25 NOTE — Progress Notes (Signed)
 Ben Ishaan Villamar D.[name] [address] [place] [address] Phone: [phone]   Assessment and Plan:     1. Chronic bilateral low back pain without sciatica (Primary) 2. Chronic bilateral thoracic back pain 3. Somatic dysfunction of cervical region 4. Somatic dysfunction of thoracic region 5. Somatic dysfunction of lumbar region 6. Somatic dysfunction of rib region 7. Somatic dysfunction of pelvic region -Chronic with exacerbation, subsequent visit - Recurrence of primarily low back pain, with additional upper back pain flared from physical activity.  Consistent with musculoskeletal dysfunction - Use meloxicam  15 mg daily as needed for breakthrough pain.  Recommend limiting chronic NSAIDs to 1-2 doses per week to prevent long-term side effects. Use Tylenol 500 to 1000 mg tablets 2-3 times a day as needed for day-to-day pain relief.    - Patient has received relief with OMT in the past.  Elects for repeat OMT today.  Tolerated well per note below. - Decision today to treat with OMT was based on Physical Exam   After verbal consent patient was treated with HVLA (high velocity low amplitude), ME (muscle energy), FPR (flex positional release), ST (soft tissue), PC/PD (Pelvic Compression/ Pelvic Decompression) techniques in cervical, rib, thoracic, lumbar, and pelvic areas. Patient tolerated the procedure well with improvement in symptoms.  Patient educated on potential side effects of soreness and recommended to rest, hydrate, and use Tylenol as needed for pain control.   Pertinent previous records reviewed include none  Follow Up: As needed if no improvement or worsening of symptoms.  Could consider repeat OMT versus trigger point injections versus imaging   Subjective:   I, Moenique Parris, am serving as a neurosurgeon for Doctor Morene Mace  Chief Complaint: low back  pain     HPI:  07/29/2023 Diana King is a 33 y.o. female coming in with complaint of  upper back pain.  Feels that the neck pain is significantly better as well as the radicular symptoms patient was having in the C8 distribution.  Gabapentin  has been helpful.   12/25/2023 Patient states low back pain started this weekend. She was cleaning and low back went into flare   Relevant Historical Information: none    Additional pertinent review of systems negative.  Current Outpatient Medications  Medication Sig Dispense Refill   Adapalene  0.3 % gel Apply 1 application topically at bedtime. 45 g 5   buPROPion  (WELLBUTRIN  XL) 300 MG 24 hr tablet Take 1 tablet (300 mg total) by mouth daily. 90 tablet 3   busPIRone  (BUSPAR ) 5 MG tablet TAKE 1 TABLET BY MOUTH TWICE A DAY 180 tablet 3   escitalopram  (LEXAPRO ) 10 MG tablet Take 1 tablet (10 mg total) by mouth daily. 90 tablet 1   hydrOXYzine  (VISTARIL ) 25 MG capsule Take 1 capsule (25 mg total) by mouth every 8 (eight) hours as needed. 30 capsule 0   ibuprofen (ADVIL) 800 MG tablet Take 800 mg by mouth.     meloxicam  (MOBIC ) 15 MG tablet Take 1 tablet (15 mg total) by mouth daily. 30 tablet 0   spironolactone (ALDACTONE) 50 MG tablet Take 50 mg by mouth daily.     tiZANidine  (ZANAFLEX ) 4 MG tablet TAKE 1 TABLET BY MOUTH EVERYDAY AT BEDTIME 90 tablet 1   triamcinolone  cream (KENALOG ) 0.1 % Apply 1 Application topically 2 (two) times daily. 30 g 0   No current facility-administered medications for this visit.      Objective:     Vitals:  12/25/23 1251  BP: 124/82  Pulse: 86  Weight: 209 lb (94.8 kg)  Height: 5' 6 (1.676 m)      Body mass index is 33.73 kg/m.    Physical Exam:     General: Well-appearing, cooperative, sitting comfortably in no acute distress.   OMT Physical Exam:  ASIS Compression Test: Positive Right Cervical: TTP paraspinal, C6 RR SL Rib: Bilateral elevated first rib with NTTP Thoracic: TTP paraspinal, T5-7 RRSL Lumbar: TTP paraspinal, L2 RLSL Pelvis: Right anterior innominate  Electronically  signed by:  Odis Mace D.[name] 1:11 PM 12/25/23

## 2023-12-30 ENCOUNTER — Ambulatory Visit: Admitting: Behavioral Health

## 2024-01-02 ENCOUNTER — Ambulatory Visit: Admitting: Behavioral Health

## 2024-01-02 DIAGNOSIS — F419 Anxiety disorder, unspecified: Secondary | ICD-10-CM | POA: Diagnosis not present

## 2024-01-02 DIAGNOSIS — F32A Depression, unspecified: Secondary | ICD-10-CM | POA: Diagnosis not present

## 2024-01-02 NOTE — Progress Notes (Addendum)
 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 Pt is aware of the risks/limitations of telehealth & consents to Tx today.  Time In: 11:00am Time Out: 11:50am  Treatment Type: Individual Therapy  Reported Symptoms: Elevated anx/dep & Family stressors; Mother will provide an updated copy of her 46 Plan; Mother will access this via Sch  Mental Status Exam: Appearance:  Casual     Behavior: Appropriate and Sharing  Motor: Normal  Speech/Language:  Clear and Coherent  Affect: Appropriate  Mood: normal  Thought process: normal  Thought content:   WNL  Sensory/Perceptual disturbances:   WNL  Orientation: oriented to person, place, time/date, and situation  Attention: Good  Concentration: Good  Memory: WNL  Fund of knowledge:  Good  Insight:   Good  Judgment:  Good  Impulse Control: Good   Risk Assessment: Danger to Self:  No Self-injurious Behavior: No Danger to Others: No Duty to Warn:no Physical Aggression / Violence:No  Access to Firearms a concern: No  Gang Involvement:No   Subjective: Pt is trying to navigate the home needs w/her Husb & Dtr. She feels unsupported by her Husb. This year has been different w/the Holiday spirit Husb is displaying. He is happy & decorating. Pt finds this hard to adapt to currently. Pt does not want to instruct her Husb on what needs to be done.    Interventions: Insight-Oriented and Family Systems  Diagnosis:Anxiety and depression  Plan: Leta is struggling to find her worth in life today. She feels burdened in the home during this Holiday season. She will provide [name]'s Revised 504 Plan to Clinician. She initiated meds to help her cope w/homelife & to process things in psychotherapy. Jhade will inc her tolerance level for patience w/her Husb  & chores. Jeffie will address her frustration & anger more immediately. She will not hold in her feelings; she will communicate more w/Dustin about her complaints. Tersea will contact Dr. Glade Hope, MD to titrate her Lexapro  from 10mg  to the desired dosing to assist Pt to find inc'd coping.   Target Date: 02/05/2023  Progress: 6  Frequency: Once every 2-3 wks  Modality: Kennis Richerd LITTIE Hollace, LMFT

## 2024-01-10 ENCOUNTER — Other Ambulatory Visit: Payer: Self-pay | Admitting: Internal Medicine

## 2024-01-27 ENCOUNTER — Ambulatory Visit: Admitting: Behavioral Health

## 2024-01-27 DIAGNOSIS — F419 Anxiety disorder, unspecified: Secondary | ICD-10-CM

## 2024-01-27 DIAGNOSIS — F32A Depression, unspecified: Secondary | ICD-10-CM | POA: Diagnosis not present

## 2024-02-04 NOTE — Progress Notes (Signed)
"    [facility] Counselor/Therapist Progress Note  Patient ID: Diana King, MRN: [id number],    Date: 01/27/2024  Time Spent: 50 min In Person @ [facility] Time In: 4:00pm Time Out: 4:50pm  Treatment Type: Individual Therapy  Reported Symptoms: Elevated anx/dep & stress over current situations in the Family  Mental Status Exam: Appearance:  Casual & neat  Behavior: Appropriate and Sharing  Motor: Normal  Speech/Language:  Clear and Coherent  Affect: Appropriate  Mood: normal  Thought process: normal  Thought content:   WNL  Sensory/Perceptual disturbances:   WNL  Orientation: oriented to person, place, time/date, and situation  Attention: Good  Concentration: Good  Memory: WNL  Fund of knowledge:  Good  Insight:   Good  Judgment:  Good  Impulse Control: Good   Risk Assessment: Danger to Self:  No Self-injurious Behavior: No Danger to Others: No Duty to Warn:no Physical Aggression / Violence:No  Access to Firearms a concern: No  Gang Involvement:No   Subjective: Diana King is irritated today due to the situation @ home & the stress levels of her Dtr.   Interventions: Family Systems and Interpersonal  Diagnosis:Anxiety and depression  Plan: We will re-initiate psychotherapy w/36yo Dtr as she enters the second half of Sch Year.  Target Date: 03/07/2024  Progress: 7  Frequency: Once every 2-3 wks  Modality: Kennis Richerd LITTIE Hollace, LMFT    "

## 2024-02-05 ENCOUNTER — Encounter: Payer: Self-pay | Admitting: Family Medicine

## 2024-02-05 ENCOUNTER — Other Ambulatory Visit: Payer: Self-pay

## 2024-02-05 ENCOUNTER — Ambulatory Visit: Admitting: Family Medicine

## 2024-02-05 VITALS — BP 104/66 | HR 84 | Ht 66.0 in | Wt 215.0 lb

## 2024-02-05 DIAGNOSIS — M79605 Pain in left leg: Secondary | ICD-10-CM

## 2024-02-05 DIAGNOSIS — M7752 Other enthesopathy of left foot: Secondary | ICD-10-CM | POA: Diagnosis not present

## 2024-02-05 MED ORDER — MELOXICAM 15 MG PO TABS: 15.0000 mg | ORAL_TABLET | Freq: Every day | ORAL | 0 refills | Status: DC

## 2024-02-05 NOTE — Assessment & Plan Note (Signed)
 More of a retrocalcaneal bursitis.  Start meloxicam , discussed bracing if needed, do not think home exercises are completely necessary.  Discussed icing regimen.  Follow-up with me again 2 months if not completely resolved

## 2024-02-05 NOTE — Patient Instructions (Signed)
Meloxicam 15 mg

## 2024-02-05 NOTE — Progress Notes (Signed)
 " [name] [address] [place] [address] Phone: [phone] Subjective:    I'm seeing this patient by the request  of:  Geofm Glade PARAS, MD  CC: Left heel pain  YEP:Dlagzrupcz  Diana King is a 34 y.o. female coming in with complaint of L achilles pain. Pain increased over this weekend. Patient has not been wearing indoor shoes as often as she had been. Using heel lift in shoe which has been helpful. Using TENS, Voltaren and IBU prn.       Past Medical History:  Diagnosis Date   Acne    Anxiety    Back pain    Depression    GERD (gastroesophageal reflux disease)    IBS (irritable bowel syndrome)    Overweight    Pain in Achilles tendon    Palpitations    Past Surgical History:  Procedure Laterality Date   NO PAST SURGERIES     Social History   Socioeconomic History   Marital status: Married    Spouse name: Dustin   Number of children: 1   Years of education: Not on file   Highest education level: Associate degree: academic program  Occupational History   Occupation: CMA  Tobacco Use   Smoking status: Never   Smokeless tobacco: Never  Substance and Sexual Activity   Alcohol use: Yes    Comment: social   Drug use: Not on file   Sexual activity: Not on file  Other Topics Concern   Not on file  Social History Narrative   Not on file   Social Drivers of Health   Tobacco Use: Low Risk (10/21/2023)   Received from [facility]   Patient History    Smoking Tobacco Use: Never    Smokeless Tobacco Use: Never    Passive Exposure: Never  Financial Resource Strain: Low Risk (08/01/2023)   Overall Financial Resource Strain (CARDIA)    Difficulty of Paying Living Expenses: Not very hard  Food Insecurity: No Food Insecurity (08/01/2023)   Epic    Worried About Programme Researcher, Broadcasting/film/video in the Last Year: Never true    Ran Out of Food in the Last Year: Never true  Transportation Needs: No Transportation Needs (08/01/2023)   Epic     Lack of Transportation (Medical): No    Lack of Transportation (Non-Medical): No  Physical Activity: Inactive (08/01/2023)   Exercise Vital Sign    Days of Exercise per Week: 0 days    Minutes of Exercise per Session: Not on file  Stress: No Stress Concern Present (10/07/2023)   Received from [facility] - Occupational Stress Questionnaire    Do you feel stress - tense, restless, nervous, or anxious, or unable to sleep at night because your mind is troubled all the time - these days?: Only a little  Social Connections: Socially Isolated (08/01/2023)   Social Connection and Isolation Panel    Frequency of Communication with Friends and Family: Never    Frequency of Social Gatherings with Friends and Family: Never    Attends Religious Services: Never    Database Administrator or Organizations: No    Attends Banker Meetings: Not on file    Marital Status: Married  Depression (PHQ2-9): Medium Risk (10/16/2023)   Depression (PHQ2-9)    PHQ-2 Score: 8  Alcohol Screen: Low Risk (08/01/2023)   Alcohol Screen    Last Alcohol Screening Score (AUDIT): 1  Housing: Low Risk (08/01/2023)   Epic    Unable to Pay for Housing in the Last Year: No    Number of Times Moved in the Last Year: 0    Homeless in the Last Year: No  Utilities: Not At Risk (07/17/2023)   Received from [facility]    Threatened with loss of utilities: No  Health Literacy: Not on file   Allergies[1] Family History  Problem Relation Age of Onset   Fibromyalgia Mother    Interstitial cystitis Mother    Migraines Mother    Depression Mother    Anxiety disorder Mother    Alcohol abuse Maternal Aunt    Mental illness Maternal Aunt    Alcohol abuse Maternal Uncle    Thyroid  disease Maternal Grandmother    Hyperlipidemia Father    Alcohol abuse Father     Current Outpatient Medications (Cardiovascular):    spironolactone (ALDACTONE) 50 MG  tablet, Take 50 mg by mouth daily.  Current Outpatient Medications (Analgesics):    ibuprofen (ADVIL) 800 MG tablet, Take 800 mg by mouth.   meloxicam  (MOBIC ) 15 MG tablet, Take 1 tablet (15 mg total) by mouth daily.   meloxicam  (MOBIC ) 15 MG tablet, Take 1 tablet (15 mg total) by mouth daily.  Current Outpatient Medications (Other):    Adapalene  0.3 % gel, Apply 1 application topically at bedtime.   buPROPion  (WELLBUTRIN  XL) 300 MG 24 hr tablet, Take 1 tablet (300 mg total) by mouth daily.   busPIRone  (BUSPAR ) 5 MG tablet, TAKE 1 TABLET BY MOUTH TWICE A DAY   escitalopram  (LEXAPRO ) 10 MG tablet, TAKE 1 TABLET BY MOUTH EVERY DAY   hydrOXYzine  (VISTARIL ) 25 MG capsule, Take 1 capsule (25 mg total) by mouth every 8 (eight) hours as needed.   tiZANidine  (ZANAFLEX ) 4 MG tablet, TAKE 1 TABLET BY MOUTH EVERYDAY AT BEDTIME   triamcinolone  cream (KENALOG ) 0.1 %, Apply 1 Application topically 2 (two) times daily.   Reviewed prior external information including notes and imaging from  primary care provider As well as notes that were available from care everywhere and other healthcare systems.  Past medical history, social, surgical and family history all reviewed in electronic medical record.  No pertanent information unless stated regarding to the chief complaint.   Review of Systems:  No headache, visual changes, nausea, vomiting, diarrhea, constipation, dizziness, abdominal pain, skin rash, fevers, chills, night sweats, weight loss, swollen lymph nodes, body aches, joint swelling, chest pain, shortness of breath, mood changes. POSITIVE muscle aches  Objective  Blood pressure 104/66, pulse 84, height 5' 6 (1.676 m), weight 215 lb (97.5 kg), SpO2 97%.   General: No apparent distress alert and oriented x3 mood and affect normal, dressed appropriately.  HEENT: Pupils equal, extraocular movements intact  Respiratory: Patient's speak in full sentences and does not appear short of breath   Cardiovascular: No lower extremity edema, non tender, no erythema  Left heel exam shows the patient does have some tightness noted.  Some mild tenderness to palpation.  Good strength no noted.  No erythema in the area.  Limited muscular skeletal ultrasound was performed and interpreted by CLAUDENE HUSSAR, M  Limited ultrasound does not show any type of dilatation of the Achilles this time.  Patient does have hypoechoic changes though deep to the Achilles that is consistent with a retrocalcaneal bursitis.  No cortical irregularity noted at the calcaneal region itself.    Impression and Recommendations:     The above  documentation has been reviewed and is accurate and complete Arthea CHRISTELLA Sharps, DO       [1]  Allergies Allergen Reactions   Latex Swelling   Topamax  [Topiramate ] Other (See Comments)   Gabapentin     "

## 2024-02-17 ENCOUNTER — Ambulatory Visit: Admitting: Behavioral Health

## 2024-02-17 DIAGNOSIS — F32A Depression, unspecified: Secondary | ICD-10-CM | POA: Diagnosis not present

## 2024-02-17 DIAGNOSIS — F419 Anxiety disorder, unspecified: Secondary | ICD-10-CM | POA: Diagnosis not present

## 2024-02-17 NOTE — Progress Notes (Signed)
"    [facility] Counselor/Therapist Progress Note  Patient ID: Diana King, MRN: [id number],    Date: 02/17/2024  Time Spent: 45 min Caregility video; Pt is in her car in private & Provider working remotely from [employer]. Pt is aware of the risks/limitations of telehealth & consents to Tx today. Time In: 4:00pm Time Out: 4:45pm  Treatment Type: Individual Therapy  Reported Symptoms: Elevated anx/dep & stress @ work & w/Dtr's Sch situ  Mental Status Exam: Appearance:  Casual     Behavior: Appropriate and Sharing  Motor: Normal  Speech/Language:  Clear and Coherent  Affect: Appropriate  Mood: anxious  Thought process: normal  Thought content:   WNL  Sensory/Perceptual disturbances:   WNL  Orientation: oriented to person, place, time/date, and situation  Attention: Good  Concentration: Good  Memory: WNL  Fund of knowledge:  Good  Insight:   Good  Judgment:  Good  Impulse Control: Good   Risk Assessment: Danger to Self:  No Self-injurious Behavior: No Danger to Others: No Duty to Warn:no Physical Aggression / Violence:No  Access to Firearms a concern: No  Gang Involvement:No   Subjective: 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 She just remembered her Husb's Bday today.   Pt lost her Beloved Pet Legrand who had to be put down 2 wks ago. Her Dtr had a very hard time dealing w/this death. This constricted her & caused a lack of solitude. She is in need of this time to renew, rejuvenate, & repair.   Interventions: Ego-Supportive, Insight-Oriented, and Family Systems  Diagnosis:Anxiety and depression  Plan: Tyjanae is trying to secure her time alone. 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 This means she has paid attn to the business side more intently. Currently, she is taking a break. Many tasks are taking up her time.   Target Date:  2/30/2026  Progress: 7  Frequency: Once every 2-3 wks  Modality: Kennis Richerd LITTIE Hollace, LMFT    "

## 2024-02-25 ENCOUNTER — Ambulatory Visit: Admitting: Sports Medicine

## 2024-02-25 ENCOUNTER — Ambulatory Visit: Admitting: Internal Medicine

## 2024-02-25 VITALS — BP 120/78 | HR 78 | Ht 66.0 in | Wt 215.0 lb

## 2024-02-25 VITALS — BP 120/78 | HR 78 | Temp 97.8°F | Ht 66.0 in

## 2024-02-25 DIAGNOSIS — S63501A Unspecified sprain of right wrist, initial encounter: Secondary | ICD-10-CM

## 2024-02-25 DIAGNOSIS — J329 Chronic sinusitis, unspecified: Secondary | ICD-10-CM

## 2024-02-25 MED ORDER — MELOXICAM 15 MG PO TABS: 15.0000 mg | ORAL_TABLET | Freq: Every day | ORAL | 0 refills | Status: AC | PRN

## 2024-02-25 MED ORDER — AMOXICILLIN-POT CLAVULANATE 875-125 MG PO TABS: 1.0000 | ORAL_TABLET | Freq: Two times a day (BID) | ORAL | 0 refills | Status: AC

## 2024-02-25 MED ORDER — TIZANIDINE HCL 4 MG PO TABS: ORAL_TABLET | ORAL | 1 refills | Status: AC

## 2024-02-25 NOTE — Progress Notes (Signed)
 "               [facility] [address] [place] [address] Phone: [phone]   Assessment and Plan:     1. Right wrist sprain, initial encounter (Primary) -Acute, initial sports medicine visit - Consistent with sprain of DRUJ versus hand extensor tendinitis likely due to sleeping position, typing -Recommend using wrist brace to decrease strain over hand and wrist at night and during the day as needed - Recommend ergonomic mouse and keyboard - Use topical Voltaren gel over areas of pain - Use meloxicam  15 mg daily as needed for breakthrough pain.  Recommend limiting chronic NSAIDs to 1-2 doses per week to prevent long-term side effects. Use Tylenol 500 to 1000 mg tablets 2-3 times a day as needed for day-to-day pain relief.    - May use tizanidine  4-8 mg every 12 hours as needed for muscle spasms  Pertinent previous records reviewed include none   Follow Up: As needed if no improvement or worsening of symptoms.  Could consider CSI   Subjective:   I, Diana King, am serving as a neurosurgeon for Doctor Morene Mace  Chief Complaint: right wrist pain   HPI:   02/25/24 Patient is a 34 year old female with right wrist pain. Patient states   Relevant Historical Information: None pertinent  Additional pertinent review of systems negative.  Current Medications[1]   Objective:     Vitals:   02/25/24 1516  BP: 120/78  Pulse: 78  SpO2: 98%  Weight: 215 lb (97.5 kg)  Height: 5' 6 (1.676 m)      Body mass index is 34.7 kg/m.    Physical Exam:    General: Appears well, nad, nontoxic and pleasant Neuro:sensation intact, strength is 5/5 in upper extremities, muscle tone wnl Skin:no susupicious lesions or rashes  Right hand/Wrist:   No deformity or swelling appreciated. ROM  Ext 90 with mild dorsal wrist pain, flexion 70, radial/ulnar deviation 30 TTP mildly dorsal carpals, dorsal DRUJ nttp over the snuff box,   volar  carpals, radial styloid, ulnar styloid, 1st mcp, tfcc Negative Tinel's, Phalen's, Prayer Tests Negative finklestein Neg tfcc bounce test No pain with resisted ext, flex or deviation    Electronically signed by:  [facility] 3:20 PM 02/25/24     [1]  Current Outpatient Medications:    Adapalene  0.3 % gel, Apply 1 application topically at bedtime., Disp: 45 g, Rfl: 5   amoxicillin -clavulanate (AUGMENTIN ) 875-125 MG tablet, Take 1 tablet by mouth 2 (two) times daily for 10 days., Disp: 20 tablet, Rfl: 0   buPROPion  (WELLBUTRIN  XL) 300 MG 24 hr tablet, Take 1 tablet (300 mg total) by mouth daily., Disp: 90 tablet, Rfl: 3   busPIRone  (BUSPAR ) 5 MG tablet, TAKE 1 TABLET BY MOUTH TWICE A DAY, Disp: 180 tablet, Rfl: 3   escitalopram  (LEXAPRO ) 10 MG tablet, TAKE 1 TABLET BY MOUTH EVERY DAY, Disp: 90 tablet, Rfl: 1   hydrOXYzine  (VISTARIL ) 25 MG capsule, Take 1 capsule (25 mg total) by mouth every 8 (eight) hours as needed., Disp: 30 capsule, Rfl: 0   ibuprofen (ADVIL) 800 MG tablet, Take 800 mg by mouth., Disp: , Rfl:    meloxicam  (MOBIC ) 15 MG tablet, Take 1 tablet (15 mg total) by mouth daily as needed for pain., Disp: 30 tablet, Rfl: 0   spironolactone (ALDACTONE) 50 MG tablet, Take 50 mg by mouth daily., Disp: , Rfl:  tiZANidine  (ZANAFLEX ) 4 MG tablet, TAKE 1 TABLET BY MOUTH EVERYDAY AT BEDTIME, Disp: 90 tablet, Rfl: 1   triamcinolone  cream (KENALOG ) 0.1 %, Apply 1 Application topically 2 (two) times daily., Disp: 30 g, Rfl: 0  "

## 2024-02-25 NOTE — Patient Instructions (Addendum)
       Medications changes include :   Augmentin x 10 days    Return if symptoms worsen or fail to improve.  

## 2024-02-25 NOTE — Patient Instructions (Signed)
-  Recommend using wrist brace to decrease strain over hand and wrist at night and during the day as needed - Recommend ergonomic mouse and keyboard - Use topical Voltaren gel over areas of pain - Use meloxicam  15 mg daily as needed for breakthrough pain.  Recommend limiting chronic NSAIDs to 1-2 doses per week to prevent long-term side effects. Use Tylenol 500 to 1000 mg tablets 2-3 times a day as needed for day-to-day pain relief.    - May use tizanidine  4-8 mg every 12 hours as needed for muscle spasms

## 2024-02-25 NOTE — Progress Notes (Signed)
 "   Subjective:    Patient ID: Diana King, female    DOB: 10-May-1990, 34 y.o.   MRN: [id number]      HPI Diana King is here for  Chief Complaint  Patient presents with   Nasal Congestion    Causes her to choke in the middle of the night; feels like she is out of breath due to it; been going on for along time (not sleeping) headache since yesterday    Discussed the use of AI scribe software for clinical note transcription with the patient, who gave verbal consent to proceed.  History of Present Illness Diana King is a 34 year old female who presents with chronic nasal congestion and drainage.  She has experienced persistent nasal congestion and drainage for approximately five months, beginning before Thanksgiving. The drainage is constant, leading to a nasally voice and frequent inquiries from others about her being sick. Despite using nasal decongestants, which help her breathe through her nose, and increasing her allergy medication, she finds no lasting relief.  She experiences significant sleep disturbances due to waking up choking on the drainage in the back of her throat. Her partner has noted her choking in her sleep over the past two weeks, prompting the visit. She also reports headaches since the previous day, located in the frontal region, which are unresponsive to treatment.  No cough, sore throat, fever, shortness of breath, or wheezing. However, she sometimes experiences dizziness and a sensation of her ears being clogged, although no visible obstruction is noted by other doctors. She occasionally feels unable to swallow and sometimes drinks water when this occurs.  Her current medication regimen includes nasal decongestants taken at night and in the morning, and allergy medications, though she questions the long-term efficacy of the decongestants.       Medications and allergies reviewed with patient and updated if appropriate.  Medications Ordered Prior to  Encounter[1]  Review of Systems  Constitutional:  Negative for fever.  HENT:  Positive for congestion, ear pain (discomfort - clogged), postnasal drip and sinus pressure (frontal). Negative for sore throat.   Respiratory:  Negative for cough, shortness of breath and wheezing.   Genitourinary:  Positive for hematuria.  Neurological:  Positive for dizziness (occ) and headaches (frontal HAs).  Psychiatric/Behavioral:  Positive for confusion and sleep disturbance (wakes up choking from PND).        Objective:   Vitals:   02/25/24 0811  BP: 120/78  Pulse: 78  Temp: 97.8 F (36.6 C)  SpO2: 98%   BP Readings from Last 3 Encounters:  02/25/24 120/78  02/05/24 104/66  12/25/23 124/82   Wt Readings from Last 3 Encounters:  02/05/24 215 lb (97.5 kg)  12/25/23 209 lb (94.8 kg)  10/16/23 210 lb (95.3 kg)   Body mass index is 34.7 kg/m.    Physical Exam Constitutional:      General: She is not in acute distress.    Appearance: Normal appearance. She is not ill-appearing.  HENT:     Head: Normocephalic and atraumatic.     Right Ear: Tympanic membrane, ear canal and external ear normal.     Left Ear: Tympanic membrane, ear canal and external ear normal.     Mouth/Throat:     Mouth: Mucous membranes are moist.     Pharynx: No oropharyngeal exudate or posterior oropharyngeal erythema.  Eyes:     Conjunctiva/sclera: Conjunctivae normal.  Cardiovascular:     Rate and Rhythm: Normal rate and regular  rhythm.  Pulmonary:     Effort: Pulmonary effort is normal. No respiratory distress.     Breath sounds: Normal breath sounds. No wheezing or rales.  Musculoskeletal:     Cervical back: Neck supple. No tenderness.  Lymphadenopathy:     Cervical: No cervical adenopathy.  Skin:    General: Skin is warm and dry.  Neurological:     Mental Status: She is alert.            Assessment & Plan:    Assessment and Plan Assessment & Plan Chronic sinusitis Persistent nasal  congestion and drainage with frontal headaches. No acute sinus pressure. Likely chronic sinus infection. - Prescribed Augmentin  for 10 days. - Continue allergy medications and nasal decongestant. - Encouraged increased fluid intake. - Advised hot beverages to loosen mucus.          [1]  Current Outpatient Medications on File Prior to Visit  Medication Sig Dispense Refill   Adapalene  0.3 % gel Apply 1 application topically at bedtime. 45 g 5   buPROPion  (WELLBUTRIN  XL) 300 MG 24 hr tablet Take 1 tablet (300 mg total) by mouth daily. 90 tablet 3   busPIRone  (BUSPAR ) 5 MG tablet TAKE 1 TABLET BY MOUTH TWICE A DAY 180 tablet 3   escitalopram  (LEXAPRO ) 10 MG tablet TAKE 1 TABLET BY MOUTH EVERY DAY 90 tablet 1   hydrOXYzine  (VISTARIL ) 25 MG capsule Take 1 capsule (25 mg total) by mouth every 8 (eight) hours as needed. 30 capsule 0   ibuprofen (ADVIL) 800 MG tablet Take 800 mg by mouth.     meloxicam  (MOBIC ) 15 MG tablet Take 1 tablet (15 mg total) by mouth daily. 30 tablet 0   meloxicam  (MOBIC ) 15 MG tablet Take 1 tablet (15 mg total) by mouth daily. 30 tablet 0   spironolactone (ALDACTONE) 50 MG tablet Take 50 mg by mouth daily.     tiZANidine  (ZANAFLEX ) 4 MG tablet TAKE 1 TABLET BY MOUTH EVERYDAY AT BEDTIME 90 tablet 1   triamcinolone  cream (KENALOG ) 0.1 % Apply 1 Application topically 2 (two) times daily. 30 g 0   No current facility-administered medications on file prior to visit.   "

## 2024-03-01 ENCOUNTER — Ambulatory Visit: Admitting: Behavioral Health

## 2024-04-05 ENCOUNTER — Encounter: Admitting: Internal Medicine
